# Patient Record
Sex: Male | Born: 1940 | Race: Black or African American | Hispanic: No | Marital: Single | Smoking: Former smoker
Health system: Southern US, Community
[De-identification: ages and names within clinical notes are randomized; demographics above are authoritative.]

## PROBLEM LIST (undated history)

## (undated) DIAGNOSIS — A498 Other bacterial infections of unspecified site: Secondary | ICD-10-CM

## (undated) DIAGNOSIS — I890 Lymphedema, not elsewhere classified: Secondary | ICD-10-CM

## (undated) DIAGNOSIS — K635 Polyp of colon: Secondary | ICD-10-CM

## (undated) DIAGNOSIS — E785 Hyperlipidemia, unspecified: Secondary | ICD-10-CM

## (undated) DIAGNOSIS — E039 Hypothyroidism, unspecified: Secondary | ICD-10-CM

## (undated) DIAGNOSIS — E119 Type 2 diabetes mellitus without complications: Secondary | ICD-10-CM

## (undated) DIAGNOSIS — K759 Inflammatory liver disease, unspecified: Secondary | ICD-10-CM

## (undated) DIAGNOSIS — Z6841 Body Mass Index (BMI) 40.0 and over, adult: Secondary | ICD-10-CM

## (undated) DIAGNOSIS — Z8546 Personal history of malignant neoplasm of prostate: Secondary | ICD-10-CM

## (undated) DIAGNOSIS — L0390 Cellulitis, unspecified: Secondary | ICD-10-CM

## (undated) DIAGNOSIS — I10 Essential (primary) hypertension: Secondary | ICD-10-CM

## (undated) HISTORY — DX: Hypothyroidism, unspecified: E03.9

## (undated) HISTORY — DX: Type 2 diabetes mellitus without complications: E11.9

## (undated) HISTORY — DX: Essential (primary) hypertension: I10

## (undated) HISTORY — DX: Polyp of colon: K63.5

## (undated) HISTORY — DX: Other bacterial infections of unspecified site: A49.8

## (undated) HISTORY — DX: Hyperlipidemia, unspecified: E78.5

## (undated) HISTORY — DX: Inflammatory liver disease, unspecified: K75.9

## (undated) HISTORY — DX: Personal history of malignant neoplasm of prostate: Z85.46

---
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
## 1999-03-30 HISTORY — PX: LAPAROSCOPIC GASTRIC BANDING: SHX1100

## 2000-03-29 HISTORY — PX: OTHER SURGICAL HISTORY: SHX169

## 2001-03-29 HISTORY — PX: INSERTION PROSTATE RADIATION SEED: SUR718

## 2009-04-16 ENCOUNTER — Ambulatory Visit: Payer: Self-pay | Admitting: Family Medicine

## 2009-05-29 ENCOUNTER — Ambulatory Visit: Payer: Self-pay | Admitting: Family Medicine

## 2010-03-29 HISTORY — PX: COLONOSCOPY: SHX174

## 2010-04-13 ENCOUNTER — Ambulatory Visit
Admission: RE | Admit: 2010-04-13 | Discharge: 2010-04-13 | Payer: Self-pay | Source: Home / Self Care | Attending: Family Medicine | Admitting: Family Medicine

## 2010-04-13 ENCOUNTER — Encounter: Payer: Self-pay | Admitting: Family Medicine

## 2010-04-13 DIAGNOSIS — E785 Hyperlipidemia, unspecified: Secondary | ICD-10-CM | POA: Insufficient documentation

## 2010-04-13 DIAGNOSIS — I10 Essential (primary) hypertension: Secondary | ICD-10-CM | POA: Insufficient documentation

## 2010-04-13 DIAGNOSIS — E881 Lipodystrophy, not elsewhere classified: Secondary | ICD-10-CM | POA: Insufficient documentation

## 2010-04-13 DIAGNOSIS — K029 Dental caries, unspecified: Secondary | ICD-10-CM | POA: Insufficient documentation

## 2010-04-13 DIAGNOSIS — E1165 Type 2 diabetes mellitus with hyperglycemia: Secondary | ICD-10-CM | POA: Insufficient documentation

## 2010-04-13 LAB — CONVERTED CEMR LAB
Cholesterol: 153 mg/dL
HDL: 80 mg/dL
Hgb A1c MFr Bld: 6.9 %
LDL Cholesterol: 61 mg/dL
Specific Gravity, Urine: 1.02
Urobilinogen, UA: 1
WBC Urine, dipstick: NEGATIVE

## 2010-04-14 ENCOUNTER — Encounter: Payer: Self-pay | Admitting: Family Medicine

## 2010-04-20 ENCOUNTER — Telehealth: Payer: Self-pay | Admitting: Family Medicine

## 2010-04-27 ENCOUNTER — Encounter: Payer: Self-pay | Admitting: Family Medicine

## 2010-04-27 ENCOUNTER — Telehealth: Payer: Self-pay | Admitting: Family Medicine

## 2010-04-27 ENCOUNTER — Ambulatory Visit
Admission: RE | Admit: 2010-04-27 | Discharge: 2010-04-27 | Payer: Self-pay | Source: Home / Self Care | Attending: Family Medicine | Admitting: Family Medicine

## 2010-04-29 DIAGNOSIS — K635 Polyp of colon: Secondary | ICD-10-CM

## 2010-04-29 HISTORY — DX: Polyp of colon: K63.5

## 2010-04-30 LAB — CONVERTED CEMR LAB
AST: 20 units/L (ref 0–37)
Alkaline Phosphatase: 77 units/L (ref 39–117)
BUN: 10 mg/dL (ref 6–23)
Basophils Absolute: 0 10*3/uL (ref 0.0–0.1)
Basophils Relative: 0 % (ref 0–1)
Creatinine, Ser: 0.87 mg/dL (ref 0.40–1.50)
Eosinophils Relative: 3 % (ref 0–5)
HCT: 43.8 % (ref 39.0–52.0)
Hemoglobin: 14.5 g/dL (ref 13.0–17.0)
MCHC: 33.1 g/dL (ref 30.0–36.0)
Monocytes Absolute: 0.9 10*3/uL (ref 0.1–1.0)
RDW: 14 % (ref 11.5–15.5)

## 2010-04-30 NOTE — Progress Notes (Signed)
  Phone Note Outgoing Call   Call placed by: Levonne Spiller EMT-P,  April 20, 2010 3:16 PM Call placed to: Patient Summary of Call: Pt. was given his current lab results and stated that he would be in the clinic on Jan. 30th for his follow-up. Initial call taken by: Levonne Spiller EMT-P,  April 20, 2010 3:17 PM

## 2010-04-30 NOTE — Letter (Signed)
Summary: MEDICAL RECORDS FROM [facility]  MEDICAL RECORDS FROM [facility]   Imported By: Rosine Beat 04/13/2010 16:44:45  _____________________________________________________________________  External Attachment:    Type:   Image     Comment:   External Document

## 2010-04-30 NOTE — Letter (Signed)
Summary: statement for diabetic supplies  statement for diabetic supplies   Imported By: Rosine Beat 04/14/2010 15:52:29  _____________________________________________________________________  External Attachment:    Type:   Image     Comment:   External Document

## 2010-04-30 NOTE — Assessment & Plan Note (Signed)
Summary: check up/jbb   Vital Signs:  Patient Profile:   70 Years Old Male CC:      General Medical Evaluation Height:     72 inches Weight:      450 pounds BMI:     61.25 O2 Sat:      95 % O2 treatment:    Room Air Temp:     98.1 degrees F oral Pulse rate:   98 / minute Pulse rhythm:   regular Resp:     20 per minute BP sitting:   157 / 103  (right arm)  Pt. in pain?   no  Vitals Entered By: Levonne Spiller EMT-P (April 13, 2010 11:35 AM)              Is Patient Diabetic? No CBG Result 140  Does patient need assistance? Functional Status Self care Ambulation Normal      Current Allergies: No known allergies History of Present Illness History from: patient Reason for visit: medication refills Chief Complaint: General Medical Evaluation History of Present Illness: The patient presented today because he needed to have his medications refilled.  He has not been seen by a provider since I saw him in [place] in March 2011.  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 He says that he has been without his medication and needs refills.  He reports having prostate problems but he has not been back to see his urologist as they had recommended to him.  He says that he is having some wheezing at night and SOB.  He has severe OSA but stopped using his CPAP because "he has been busy."  He denies CP but reports fatigue and increasing edema in the lower legs since stopping the use of diuretics.  He only occasionally checks his blood sugars but reports readings of 160 - 200.  He has rarely had blood sugar readings less than 100.  The patient reports that he is urinating more frequently in the past year.  He says that he has a history of prostate cancer and had his prostate seeded years ago in [place] but never followed up for treatment.   The patient reports that he established with a new diabetes supply company and they will be sending him supplies for his  lancets and testing strips.    REVIEW OF SYSTEMS Constitutional Symptoms      Denies fever, chills, night sweats, weight loss, weight gain, and fatigue.  Eyes       Denies change in vision, eye pain, eye discharge, glasses, contact lenses, and eye surgery. Ear/Nose/Throat/Mouth       Denies hearing loss/aids, change in hearing, ear pain, ear discharge, dizziness, frequent runny nose, frequent nose bleeds, sinus problems, sore throat, hoarseness, and tooth pain or bleeding.  Respiratory       Complains of wheezing and shortness of breath.      Denies dry cough, productive cough, asthma, bronchitis, and emphysema/COPD.  Cardiovascular       Complains of tires easily with exhertion.      Denies murmurs and chest pain.    Gastrointestinal       Complains of nausea/vomiting.      Denies stomach pain, diarrhea, constipation, blood in bowel movements, and indigestion. Genitourniary       Complains of loss of urinary control.      Denies painful urination, blood or discharge from penis, and kidney stones. Neurological  Denies paralysis, seizures, and fainting/blackouts. Musculoskeletal       Denies muscle pain, joint pain, joint stiffness, decreased range of motion, redness, swelling, muscle weakness, and gout.  Skin       Denies bruising, unusual mles/lumps or sores, and hair/skin or nail changes.  Psych       Denies mood changes, temper/anger issues, anxiety/stress, speech problems, depression, and sleep problems. Other Comments: Pt. is being seen for Check Up exam, and Rx refills.   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  Pt says he quit using tobacco in 1985.  Denies ETOH and Recreational Drugs. Physical Exam General appearance: morbidly obese, well developed, well nourished, no acute distress Head: normocephalic, atraumatic Eyes: conjunctivae and lids normal Pupils: equal, round, reactive to light Ears: normal, no lesions or deformities Nasal: mucosa pink, nonedematous, no septal deviation, turbinates normal Oral/Pharynx: tongue normal, posterior pharynx without erythema or exudate, dental caries seen Neck: neck supple,  trachea midline, no masses Thyroid: no nodules, masses, tenderness, or enlargement Chest/Lungs: mild anterior exp wheezes heard Heart: regular rate and  rhythm, no murmur Abdomen: morbidly obese, 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, soft, non-tender without obvious organomegaly Extremities: 3++ edema bilateral lower extremites, chronic venous stasis dermatitis Neurological: grossly intact and non-focal Skin: no obvious rashes or lesions MSE: oriented to time, place, and person Assessment Problems:   New Problems: UNSPECIFIED DENTAL CARIES (ICD-521.00) 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 The risks, benefits and possible side effects were clearly explained and discussed with the patient.  The patient verbalized clear understanding.  The patient was given instructions to return if symptoms don't improve, worsen or new changes develop.  If it is not during clinic hours and the patient cannot get back to this clinic then the patient was told to seek  medical care at an available urgent care or emergency department.  The patient verbalized understanding.   Demonstrates willingness to comply.  Plan New Medications/Changes: AZITHROMYCIN 250 MG TABS (AZITHROMYCIN) take 2 tabs by mouth on day 1, then 1 tab by mouth daily until completed.  #6 x 0, 04/13/2010, Clanford Johnson MD METFORMIN HCL 1000 MG (OSM) XR24H-TAB (METFORMIN HCL) take 1 by mouth two times a day with meals  #60 x 1, 04/13/2010, Clanford Johnson MD METOPROLOL TARTRATE 50 MG TABS (METOPROLOL TARTRATE) 1 tab twice per day  #60 x 1, 04/13/2010, Clanford Johnson MD QUINAPRIL HCL 20 MG TABS (QUINAPRIL HCL) 1 tab twice per day  #30 x 1, 04/13/2010, Clanford Johnson MD SIMVASTATIN 20 MG TABS (SIMVASTATIN) 1 tab per day  #30 x 1, 04/13/2010, Clanford Johnson MD LEVOTHYROXINE SODIUM 200 MCG SOLR (LEVOTHYROXINE SODIUM) 1 tab per day  #30 x 0, 04/13/2010, Clanford Johnson MD LEVEMIR FLEXPEN 100 UNIT/ML SOLN (INSULIN DETEMIR) 1 dose per day  #1 box x 0, 04/13/2010, Clanford Johnson MD HUMULIN R 100 UNIT/ML SOLN (  INSULIN REGULAR HUMAN) 1 dose per day  #1 vial x 0, 04/13/2010, Clanford Johnson MD FUROSEMIDE 40 MG TABS (FUROSEMIDE) 1 tab per day  #30 x 0, 04/13/2010, Clanford Johnson MD DETROL LA 4 MG XR24H-CAP (TOLTERODINE TARTRATE) 1 tab per day  #30 x 0, 04/13/2010, Clanford Johnson MD  Planning Comments:   I counseled patient at length about the importance of regular medical care and close follow up. The patient verbalized clear understanding.  I spent more than 70 mins with patient today.  Labs are pending.  Pt was instructed to test blood glucose 3 times per day and as needed.  The patient verbalized clear understanding.  Will refill the patient's medications.  F/U in 1 month.  Elevate legs and take diuretic medications.  Lipids are doing well on medications.   Follow Up: 1 month   The patient and/or caregiver has been counseled thoroughly with regard to medications prescribed including  dosage, schedule, interactions, rationale for use, and possible side effects and they verbalize understanding.  Diagnoses and expected course of recovery discussed and will return if not improved as expected or if the condition worsens. Patient and/or caregiver verbalized understanding.  Prescriptions: AZITHROMYCIN 250 MG TABS (AZITHROMYCIN) take 2 tabs by mouth on day 1, then 1 tab by mouth daily until completed.  #6 x 0   Entered and Authorized by:   Standley Dakins MD   Signed by:   Standley Dakins MD on 04/13/2010   Method used:   Electron[facility] to        [facility] [id number] (retail)       [address]       [place], [place]  [address]       Ph: [phone] or [phone]       Fax: [phone]   RxID:   [id number] METFORMIN HCL 1000 MG (OSM) XR24H-TAB (METFORMIN HCL) take 1 by mouth two times a day with meals  #60 x 1   Entered and Authorized by:   Standley Dakins MD   Signed by:   Standley Dakins MD on 04/13/2010   Method used:   [facility]        [facility] [id number] (retail[facility]       [address]       [place], [place]  [address]       Ph: [phone] or [phone]       Fax: [phone]   RxID:   [id number] METOPROLOL TARTRATE 50 MG TABS (METOPROLOL TARTRATE) 1 tab twice per day  #60 x 1   Entered and Authorized by:   Standley Dakins MD   Signed by:   Standley Dakins MD on 04/13/2010   Method used:   [facility]        [facility] [id number] ([facility]ail[facility]       [address]       [place], [place]  [address]       Ph: [phone] or [phone]       Fax: [phone]   RxID:   [id number] QUINAPRIL HCL 20 MG TABS (QUINAPRIL HCL) 1 tab twice per day  #30 x 1   Entered and Authorized by:   Standley Dakins MD   Signed by:   Standley Dakins MD on 04/13/2010   Method used:   [facility]        [facility] [phone][facility] (retail)       [address]       [place], [place]  [address]       Ph: [phone]  or [phone]       Fax: [phone]   RxID:   [id number] SIMVASTATIN 20 MG TABS (SIMVASTATIN) 1 tab per day  #30 x 1   Entered and Authorized by:   Standley Dakins MD   Signed by:   Standley Dakins MD on 04/13/2010   Method used:   [facility]        [facility] [id number]       [address]       [place], [place]  [address]       Ph: [phone] or [phone]       Fax: [phone]   RxID:   [id number] LEVOTHYROXINE SODIUM 200 MCG SOLR (LEVOTHYROXINE SODIUM) 1 tab per day  #30 x 0   Entered and Authorized by:   Standley Dakins MD   Signed by:   Standley Dakins MD on 04/13/2010   Method used:   [facility]        [facility] [id number]       [address]       [place], [place]  [address]       Ph: [phone] or [phone]       Fax: [phone]   RxID:   [id number] LEVEMIR FLEXPEN 100 UNIT/ML SOLN (INSULIN DETEMIR) 1 dose per day  #1 box x 0   Entered and Authorized by:   Standley Dakins MD   Signed by:   Standley Dakins MD on 04/13/2010   Method used:   [facility]        [facility] [id number]       [address]       [place], [place]  [address]       Ph: [phone] or [phone]       Fax: [phone]   RxID:   [id number] HUMULIN R 100 UNIT/ML SOLN (INSULIN REGULAR HUMAN) 1 dose per day  #1 vial x 0   Entered and Authorized by:   Standley Dakins MD   Signed by:   Standley Dakins MD on 04/13/2010   Method used:   [facility]        [facility] (retail)       [address]       [place], [place]  [address]       Ph: [phone] or [phone]       Fax: [phone]   RxID:   [id number] FUROSEMIDE 40 MG TABS (FUROSEMIDE) 1 tab per day  #30 x 0   Entered and Authorized by:   Standley Dakins MD   Signed by:   Standley Dakins MD on 04/13/2010   Method used:   [facility]        [facility] [id number]        [address]       [place], [place]  [address]       Ph: [phone] or [phone]       Fax: [phone]   RxID:   [id number] DETROL LA 4 MG XR24H-CAP (TOLTERODINE TARTRATE) 1 tab per day  #30 x 0   Entered and Authorized by:   Standley Dakins MD   Signed by:   Standley Dakins MD on 04/13/2010   Method used:   [facility]        [facility] [phone][facility] (retail)       [address]  [place], [place]  [address]       Ph: [phone] or [phone]       Fax: [phone]   RxID:   [id number]   Patient Instructions: 1)  Check your blood sugars regularly. If your readings are usually above 200 : or below 70 you should contact our office. 2)  It is important that your Diabetic A1c level is checked every 3 months. 3)  See your eye doctor yearly to check for diabetic eye damage. 4)  Check your feet each night for sore areas, calluses or signs of infection. 5)  Check your Blood Pressure regularly. If it is above:140/90  you should make an appointment. 6)  Please schedule a follow-up appointment in 1 month. 7)  You received a flu vaccine today.  8)  The patient was informed that there is no on-call provider or services available at this clinic during off-hours (when the clinic is closed).  If the patient developed a problem or concern that required immediate attention, the patient was advised to go the the nearest available urgent care or emergency department for medical care.  The patient verbalized understanding.    9)  The patient's prescriptions were checked for possible interactions and electronically sent to the pharmacy of choice.    Medication Administration  Medication # 1:    Medication: Albuterol Sulfate Sol 3mg  unit dose    Diagnosis: WHEEZING    Dose: 1    Route: inhaled    Exp Date: 03/29/2011    Lot #: [id number]    Mfr: NEPHRON    Patient tolerated medication without complications    Given by: Levonne Spiller EMT-P (April 13, 2010 1:05  PM)   Immunizations Administered:  Influenza Vaccine:    Vaccine Type: FLUZONE    Site: right deltoid    Mfr: Sanofi Pasteur    Dose: 0.5 ml    Route: IM    Given by: Levonne Spiller EMT-P    Exp. Date: 09/26/2010    Lot #: [id number]    VIS given: 10/21/09 version given April 13, 2010.  Lab Results Urinalysis:      Color:     Yellow    Appear:     Clear    Leuk:     Neg    Nitr:     Neg    Urobil:     1.0    Prot:     Trace    pH:     8.0    Blood:     Neg    Sp. Gr:     1.020    Ket:     Trace    Bili:     Neg    Glu:     Neg    U Micralb:   30 mg/L  Lipids      Chol:     153   mg/dL    Trig:     60   mg/dL    HDL:     80   mg/dL    LDL:     61   mg/dL     Hgb Q2V:     6.9   %   Immunizations Administered:  Influenza Vaccine:    Vaccine Type: FLUZONE    Site: right deltoid    Mfr: Sanofi Pasteur    Dose: 0.5 ml    Route: IM    Given by: Levonne Spiller EMT-P    Exp. Date: 09/26/2010    Lot #:  [id number]    VIS given: 10/21/09 version given April 13, 2010.  Flu Vaccine Consent Questions:    Do you have a history of severe allergic reactions to this vaccine? no    Any prior history of allergic reactions to egg and/or gelatin? no    Do you have a sensitivity to the preservative Thimersol? no    Do you have a past history of Guillan-Barre Syndrome? no    Do you currently have an acute febrile illness? no    Have you ever had a severe reaction to latex? no    Vaccine information given and explained to patient? yes  I spent over 70 mins with patient today reviewing his complicated medical history. Pt is a very poor historian and not compliant with medical recommendations and doctor's orders, etc.  The patient was given instructions to please follow instructions.  I reviewed the brief notes sent from [facility] in [place], [place].  I also requested that the patient see his urologist immediately to follow up on his prostate. The patient says that he will  make an appointment as soon as he gets home.  Will follow up on the labs when available.  I told the patient to stop blindly injecting insulin into the area of lipohypertrophy on his right abdomen. The patient verbalized clear understanding.  I asked the patient to rotate sites of insulin injection and to please monitor BS closely 3x/d and as needed. The patient verbalized clear understanding.   Rodney Langton, MD, CDE, [employer]

## 2010-05-01 ENCOUNTER — Telehealth: Payer: Self-pay | Admitting: Family Medicine

## 2010-05-06 NOTE — Assessment & Plan Note (Signed)
Summary: F/U FROM LAST VISIT/EVM   Vital Signs:  Patient profile:   70 year old male O2 Sat:      97 % on Room air Temp:     98.7 degrees F oral Pulse rate:   82 / minute Pulse rhythm:   regular Resp:     18 per minute BP sitting:   130 / 90  (left arm)  Vitals Entered By: Standley Dakins MD (April 27, 2010 1:32 PM)  O2 Flow:  Room air  History of Present Illness: The patient presented today for a followup appointment. The patient reports that he has been taking his medications more regularly. He does admit that he has not been taking the furo does not take it on some occasions because of fear that he will have a urinary accident. The patient reports that when he does take the Lasix he has noticed an improvement and the chronic edema in the lower extremities. He has noticed more improvement in the left lower extremity. The patient reports that his blood sugars have been between 177 and 200. He reports that his blood sugar was 177 this morning. He reports that he consumed 2 yogurts desserts last night. The patient reports that he is not having a lot of cramping in the extremities. The patient reports that he has experienced cramping from taking Lasix in the past. He reports that he's had no hypoglycemia meaning no blood sugars less than 100. The patient reports that he is scheduled to followup with [facility] and also with his gastroenterology to followup with them for the scheduled evaluations. The patient reports that he is planning to travel to [place] later this week to handle business affairs. The patient reports that he otherwise has been feeling well. He's not having chest pain or shortness of breath. He reports that his exercise tolerance has been stable. the patient denies having headaches. He does report that he is still consuming large amounts of fast foods. He reports that he likes fried fish and reports that he has been consuming chicken wings from a fast food restaurant. The  patient reports that he goes to [employer] chicken as well. We spent some time today discussing dietary choices and making wiser choices in terms of what's available at some of these places and I strongly encouraged the patient to try and avoid a large sodium content diet.  The patient verbalized understanding.  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  Pt says he quit using tobacco in 1985.  Denies ETOH and Recreational Drugs.  Family History: Reviewed history from 04/13/2010 and no changes required. DM HTN  Social History: Reviewed history from 04/13/2010 and no changes required. 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  Pt says he quit using tobacco in 1985.  Denies ETOH and Recreational Drugs.  Review of Systems General:  Complains of fatigue and sleep disorder; denies chills, fever, loss of appetite, malaise, sweats, weakness, and weight loss;  The patient has obstructive sleep apnea but is no longer using his CPAP.Marland Kitchen Eyes:  Denies blurring, discharge, double vision, eye irritation, eye pain, halos, itching, light sensitivity, red eye, vision loss-1 eye, and vision loss-both eyes. ENT:  Denies decreased hearing, difficulty swallowing, ear discharge, earache, hoarseness, nasal congestion, nosebleeds, postnasal drainage, ringing in ears, sinus pressure, and sore throat. CV:  Complains of difficulty  breathing while lying down and fatigue; denies bluish discoloration of lips or nails, chest pain or discomfort, difficulty breathing at night, fainting, leg cramps with exertion, lightheadness, near fainting, palpitations, shortness of breath with exertion, swelling of feet, swelling of hands, and weight gain. Resp:  Denies chest discomfort, chest pain with inspiration, cough, coughing up blood, excessive snoring, hypersomnolence, morning headaches, pleuritic, shortness of breath, sputum productive, and wheezing. GI:  Complains of indigestion; denies abdominal pain, bloody stools, change in bowel habits, constipation, dark tarry stools, diarrhea, excessive appetite, gas, hemorrhoids, loss of appetite, nausea, vomiting, vomiting blood, and yellowish skin color. GU:  Complains of dysuria, incontinence, and urinary frequency; denies decreased libido, discharge, erectile dysfunction, genital sores, hematuria, nocturia, and urinary hesitancy. MS:  Denies joint pain, joint redness, joint swelling, loss of strength, low back pain, mid back pain, muscle aches, muscle , cramps, muscle weakness, stiffness, and thoracic pain. Neuro:  Denies brief paralysis, difficulty with concentration, disturbances in coordination, falling down, headaches, inability to speak, memory loss, numbness, poor balance, seizures, sensation of room spinning, tingling, tremors, visual disturbances, and weakness. Psych:  Denies alternate hallucination ( auditory/visual), anxiety, depression, easily angered, easily tearful, irritability, mental problems, panic attacks, sense of great danger, suicidal thoughts/plans, thoughts of violence, unusual visions or sounds, and thoughts /plans of harming others. Endo:  Complains of polyuria; denies cold intolerance, excessive hunger, excessive thirst, excessive urination, heat intolerance, and weight change.  Physical Exam  General:  morbidly obese, well developed, well nourished, no acute  distress Head:  Normocephalic and atraumatic without obvious abnormalities. No apparent alopecia or balding. Eyes:  No corneal or conjunctival inflammation noted. EOMI. Perrla. Funduscopic exam benign, without hemorrhages, exudates or papilledema. Vision grossly normal. Ears:  External ear exam shows no significant lesions or deformities.  Otoscopic examination reveals clear canals, tympanic membranes are intact bilaterally without bulging, retraction, inflammation or discharge. Hearing is grossly normal bilaterally. Nose:  External nasal examination shows no deformity or inflammation. Nasal mucosa are pink and moist without lesions or exudates. Mouth:  Oral mucosa and oropharynx without lesions or exudates.  poor dentition, teeth missing, and gingival inflammation and caries present. Neck:  neck supple,  trachea midline, no masses Lungs:  Normal respiratory effort, chest expands symmetrically. Lungs are clear to auscultation, no crackles or wheezes. Heart:  Normal rate and regular rhythm. S1 and S2 normal without gallop, murmur, click, rub or other extra sounds. Abdomen:  Obese, soft nondistended no masses no guarding or rigidity and no hepatosplenomegaly noted. Bowel sounds present. Msk:  mild improvement in the 3++ stasis edema in the left lower extremity and very minimal improvement in the right lower extremity edema. Extremities:  As above, mild improvement in the 3++ edema bilateral lower extremites, chronic venous stasis dermatitis over skin of both lower extremities Neurologic:  grossly intact and non-focal Skin:  stasis dermatitis bilateral lower extremities Psych:  Cognition and judgment appear intact. Alert and cooperative with normal attention span and concentration. No apparent delusions, illusions, hallucinations   Impression & Recommendations:  Problem # 1:  UNSPECIFIED ESSENTIAL HYPERTENSION (ICD-401.9)  His updated medication list for  this problem includes:    Furosemide 40 Mg  Tabs (Furosemide) .Marland Kitchen... 1 tab per day    Metoprolol Tartrate 50 Mg Tabs (Metoprolol tartrate) .Marland Kitchen... 1 tab twice per day    Quinapril Hcl 20 Mg Tabs (Quinapril hcl) .Marland Kitchen... 1 tab twice per day  Problem # 2:  LEG EDEMA, BILATERAL (ICD-782.3)  His updated medication list for this problem includes:    Furosemide 40 Mg Tabs (Furosemide) .Marland Kitchen... 1 tab per day  The patient is not taking it every day.  He was advised to take it daily for the next 2 weeks to get the edema under better control and then try every other day useage.  In addition, an RX for KCL tablets given to patient to avoid excessive potassium losses from lasix.    Problem # 3:  DIABETES MELLITUS, TYPE II, CONTROLLED (ICD-250.00)  His updated medication list for this problem includes:    Humulin R 100 Unit/ml Soln (Insulin regular human) .Marland Kitchen... 1 dose per day    Levemir Flexpen 100 Unit/ml Soln (Insulin detemir) .Marland Kitchen... 1 dose per day    Metformin Hcl 1000 Mg (osm) Xr24h-tab (Metformin hcl) .Marland Kitchen... Take 1 by mouth two times a day with meals    Quinapril Hcl 20 Mg Tabs (Quinapril hcl) .Marland Kitchen... 1 tab twice per day  I reviewed that meter with the patient today and he is having some blood glucose readings >200, mostly thought to be related to extremely poor diet and food choices.  The patient was counseled by me on better food choices and portion sizes.  The patient verbalized clear understanding.  Rodney Langton, MD, CDE, FAAFP   Problem # 4:  DYSLIPIDEMIA (ICD-272.4)  His updated medication list for this problem includes:    Simvastatin 20 Mg Tabs (Simvastatin) .Marland Kitchen... 1 tab per day  The patient was asked to follow up at the beginning of April 2012 for repeat blood work, Catering manager. to determine if this is helping. The patient verbalized clear understanding.    Problem # 5:  PERS HX NONCOMPLIANCE W/MED TX PRS HAZARDS HLTH (ICD-V15.81) The patient seems to be trying harder to do better and care for himself better.  He was encouraged today and will continue  to require ongoing close follow up.  Rodney Langton, MD, CDE, FAAFP   Complete Medication List: 1)  Detrol La 4 Mg Xr24h-cap (Tolterodine tartrate) .Marland Kitchen.. 1 tab per day 2)  Furosemide 40 Mg Tabs (Furosemide) .Marland Kitchen.. 1 tab per day 3)  Humulin R 100 Unit/ml Soln (Insulin regular human) .Marland Kitchen.. 1 dose per day 4)  Levemir Flexpen 100 Unit/ml Soln (Insulin detemir) .Marland Kitchen.. 1 dose per day 5)  Levothyroxine Sodium 200 Mcg Solr (Levothyroxine sodium) .Marland Kitchen.. 1 tab per day 6)  Metformin Hcl 1000 Mg (osm) Xr24h-tab (Metformin hcl) .... Take 1 by mouth two times a day with meals 7)  Metoprolol Tartrate 50 Mg Tabs (Metoprolol tartrate) .Marland Kitchen.. 1 tab twice per day 8)  Quinapril Hcl 20 Mg Tabs (Quinapril hcl) .Marland Kitchen.. 1 tab twice per day 9)  Simvastatin 20 Mg Tabs (Simvastatin) .Marland Kitchen.. 1 tab per day 10)  K-tabs 10 Meq Cr-tabs (Potassium chloride) .... Take 1 by mouth daily when taking furosemide (lasix) as directed  Patient Instructions: 1)  Go to the pharmacy and pick up your prescription (s).  It may take up to 30 mins for electronic prescriptions to be delivered to the pharmacy.  Please call if your pharmacy has not received your prescriptions after 30 minutes.  2)  Limit your Sodium (Salt). 3)  Limit your Sodium (Salt) to less than 2 grams a day(slightly less than 1/2 a teaspoon) to prevent fluid retention, swelling, or worsening of symptoms. 4)  It is important that you exercise regularly at least 20 minutes 5 times a week. If you develop chest pain, have severe difficulty breathing, or feel very tired , stop exercising immediately and seek medical attention. 5)  You need to lose weight. Consider a lower calorie diet and regular exercise.  6)  Check your blood sugars regularly. If your readings are usually above : 250 or below 70 you should contact our office. 7)  See your eye doctor yearly to check for diabetic eye damage. 8)  Check your Blood Pressure regularly. If it is above: 140/90 you should make an appointment. 9)   The patient was informed that there is no on-call provider or services available at this clinic during off-hours (when the clinic is closed).  If the patient developed a problem or concern that required immediate attention, the patient was advised to go the the nearest available urgent care or emergency department for medical care.  The patient verbalized understanding.    10)  Check your feet each night for sore areas, calluses or signs of infection. Prescriptions: K-TABS 10 MEQ CR-TABS (POTASSIUM CHLORIDE) take 1 by mouth daily when taking furosemide (lasix) as directed  #30 x 1   Entered and Authorized by:   Standley Dakins MD   Signed by:   Standley Dakins MD on 04/27/2010   Method used:   Electronically to        [facility]  [address] (retail)       [address]       [place], [place]  [address]       Ph: [phone] or [phone]       Fax: [phone]   RxID:   [id number]

## 2010-05-06 NOTE — Progress Notes (Signed)
  Phone Note Call from Patient   Caller: Patient Reason for Call: Talk to Doctor Summary of Call: The patient was seen in the clinic today.  He would like for the doctor to call him so he can verify some information with him.  You can call the patient at [phone]. Initial call taken by: Dorna Leitz,  April 27, 2010 4:43 PM  Follow-up for Phone Call        Pt was called back and he says that he needs a note saying that he was seen here today because he was due to be in court today in the state of [place] and ended up missing the appointment because he was still here in [place].  Pt says that he needs to have the note faxed to his attorney in [place].  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  I explained to the patient that the only thing that I could do would be to give him a letter stating that the patient was seen here in the clinic today.  I told him that I would not be able to report any more than that. The patient verbalized clear understanding.  Pt says that it should be faxed to his attorneys office:  [place] [phone].   Will fax today at patient's request.  Follow-up by: Standley Dakins MD,  April 27, 2010 5:39 PM

## 2010-05-06 NOTE — Progress Notes (Signed)
Summary: Pt called to thank  ---- Converted from flag ---- ---- 04/28/2010 5:03 PM, Rosine Beat wrote: Mr. Derek Bass called and said thank you so very much for sending the paperwork in for him, it helped him at the hearing. ------------------------------

## 2010-05-06 NOTE — Letter (Signed)
Summary: Work [employer]  [address]   [place], [place] [address]   Phone: [phone]  Fax: [phone]    Today's Date: April 27, 2010  Name of Patient: Derek Bass  (DOB 08-10-1940)  The above named patient had a medical visit today at:  12:45 PM  to evaluate complicated medical conditions.   Please take this into consideration when reviewing the time away from work/school and other activities and events.      Special Instructions:   [  ] Other ________________________________________________________________ ________________________________________________________________________   Sincerely yours,   Standley Dakins MD

## 2010-05-13 ENCOUNTER — Encounter: Payer: Self-pay | Admitting: Family Medicine

## 2010-05-18 ENCOUNTER — Encounter: Payer: Self-pay | Admitting: Family Medicine

## 2010-05-20 NOTE — Progress Notes (Signed)
Summary: GI records  GI records   Imported By: Dorna Leitz 05/14/2010 16:41:49  _____________________________________________________________________  External Attachment:    Type:   Image     Comment:   External Document

## 2010-05-22 ENCOUNTER — Other Ambulatory Visit: Payer: Self-pay | Admitting: Gastroenterology

## 2010-05-22 ENCOUNTER — Ambulatory Visit (HOSPITAL_COMMUNITY)
Admission: RE | Admit: 2010-05-22 | Discharge: 2010-05-22 | Disposition: A | Discharge status: Home / Self Care | Payer: Medicare PPO | Source: Ambulatory Visit | Attending: Gastroenterology | Admitting: Gastroenterology

## 2010-05-22 DIAGNOSIS — K5730 Diverticulosis of large intestine without perforation or abscess without bleeding: Secondary | ICD-10-CM | POA: Insufficient documentation

## 2010-05-22 DIAGNOSIS — K644 Residual hemorrhoidal skin tags: Secondary | ICD-10-CM | POA: Insufficient documentation

## 2010-05-22 DIAGNOSIS — Z1211 Encounter for screening for malignant neoplasm of colon: Secondary | ICD-10-CM | POA: Insufficient documentation

## 2010-05-22 DIAGNOSIS — D126 Benign neoplasm of colon, unspecified: Secondary | ICD-10-CM | POA: Insufficient documentation

## 2010-05-22 DIAGNOSIS — K648 Other hemorrhoids: Secondary | ICD-10-CM | POA: Insufficient documentation

## 2010-05-26 NOTE — Medication Information (Signed)
Summary: Tax adviser   Imported By: Dorna Leitz 05/20/2010 19:28:54  _____________________________________________________________________  External Attachment:    Type:   Image     Comment:   External Document

## 2010-06-01 ENCOUNTER — Telehealth: Payer: Self-pay | Admitting: Family Medicine

## 2010-06-01 ENCOUNTER — Encounter: Payer: Self-pay | Admitting: Family Medicine

## 2010-06-09 NOTE — Progress Notes (Signed)
  Phone Note Call from Patient   Summary of Call: Patient called in stated that he had a little to much sweets yesterday for his birthday.. this morning his blood sugar was 280 @ 8:55am he gave himself the daily injection levenir fix pen and 9:00 keytone test it came up low... patient wanted to what should he do to make his blood sugar come down..have not ate anything today.. please cantact patient @ [phone] Initial call taken by: Eugenio Hoes,  June 01, 2010 12:41 PM  Follow-up for Phone Call        I called patient and he said that he took 30 units extra of his humulin insulin.  He said that he had been eating cake and parfait treats and his blood glucose started climbing. Now it has improved and come down.   I told him to continue to monitor his blood sugar closely and to take 10 units of Humulin R when he eats next meal.  Call us in 3 days with new blood sugar readings.  The patient verbalized clear understanding.   HERE IS A SUMMARY OF HOME BLOOD GLUCOSE READINGS: 855am 280 1055    [id number]  165   Follow-up by: Standley Dakins MD,  June 01, 2010 3:52 PM

## 2010-06-25 NOTE — Medication Information (Signed)
Summary: prescription  prescription   Imported By: Eugenio Hoes 06/19/2010 12:28:40  _____________________________________________________________________  External Attachment:    Type:   Image     Comment:   External Document

## 2010-08-31 ENCOUNTER — Ambulatory Visit: Payer: Medicare PPO | Admitting: Family Medicine

## 2010-09-14 ENCOUNTER — Ambulatory Visit: Payer: Medicare PPO | Admitting: Family Medicine

## 2010-09-15 ENCOUNTER — Encounter (INDEPENDENT_AMBULATORY_CARE_PROVIDER_SITE_OTHER): Payer: Medicare PPO | Admitting: Family Medicine

## 2010-09-15 ENCOUNTER — Other Ambulatory Visit: Payer: Self-pay | Admitting: Family Medicine

## 2010-09-17 ENCOUNTER — Ambulatory Visit (INDEPENDENT_AMBULATORY_CARE_PROVIDER_SITE_OTHER): Payer: Medicare HMO | Admitting: Family Medicine

## 2010-09-17 ENCOUNTER — Encounter: Payer: Self-pay | Admitting: Family Medicine

## 2010-09-17 VITALS — BP 158/98 | HR 80 | Temp 98.3°F | Ht 74.0 in | Wt >= 6400 oz

## 2010-09-17 DIAGNOSIS — K029 Dental caries, unspecified: Secondary | ICD-10-CM

## 2010-09-17 DIAGNOSIS — R609 Edema, unspecified: Secondary | ICD-10-CM

## 2010-09-17 DIAGNOSIS — E039 Hypothyroidism, unspecified: Secondary | ICD-10-CM | POA: Insufficient documentation

## 2010-09-17 DIAGNOSIS — E785 Hyperlipidemia, unspecified: Secondary | ICD-10-CM

## 2010-09-17 DIAGNOSIS — E119 Type 2 diabetes mellitus without complications: Secondary | ICD-10-CM

## 2010-09-17 DIAGNOSIS — I10 Essential (primary) hypertension: Secondary | ICD-10-CM

## 2010-09-17 LAB — MICROALBUMIN / CREATININE URINE RATIO
Creatinine,U: 367.8 mg/dL
Microalb Creat Ratio: 3.7 mg/g (ref 0.0–30.0)
Microalb, Ur: 13.7 mg/dL — ABNORMAL HIGH (ref 0.0–1.9)

## 2010-09-17 LAB — COMPREHENSIVE METABOLIC PANEL
AST: 18 U/L (ref 0–37)
Albumin: 4.2 g/dL (ref 3.5–5.2)
Alkaline Phosphatase: 68 U/L (ref 39–117)
BUN: 10 mg/dL (ref 6–23)
Potassium: 4.1 mEq/L (ref 3.5–5.1)
Sodium: 141 mEq/L (ref 135–145)
Total Bilirubin: 0.7 mg/dL (ref 0.3–1.2)

## 2010-09-17 LAB — LIPID PANEL
Cholesterol: 159 mg/dL (ref 0–200)
LDL Cholesterol: 83 mg/dL (ref 0–99)
VLDL: 18.2 mg/dL (ref 0.0–40.0)

## 2010-09-17 LAB — HEMOGLOBIN A1C: Hgb A1c MFr Bld: 6.6 % — ABNORMAL HIGH (ref 4.6–6.5)

## 2010-09-17 MED ORDER — TAMSULOSIN HCL 0.4 MG PO CAPS: 0.4000 mg | ORAL_CAPSULE | Freq: Every day | ORAL | Status: DC

## 2010-09-17 MED ORDER — SIMVASTATIN 20 MG PO TABS: 20.0000 mg | ORAL_TABLET | Freq: Every day | ORAL | Status: DC

## 2010-09-17 MED ORDER — METFORMIN HCL 1000 MG PO TABS: 1000.0000 mg | ORAL_TABLET | Freq: Two times a day (BID) | ORAL | Status: DC

## 2010-09-17 MED ORDER — METOPROLOL TARTRATE 50 MG PO TABS: 50.0000 mg | ORAL_TABLET | Freq: Two times a day (BID) | ORAL | Status: DC

## 2010-09-17 MED ORDER — FUROSEMIDE 40 MG PO TABS: 40.0000 mg | ORAL_TABLET | Freq: Every day | ORAL | Status: DC

## 2010-09-17 MED ORDER — TOLTERODINE TARTRATE ER 4 MG PO CP24: 4.0000 mg | ORAL_CAPSULE | Freq: Every day | ORAL | Status: DC

## 2010-09-17 MED ORDER — QUINAPRIL HCL 20 MG PO TABS: 20.0000 mg | ORAL_TABLET | Freq: Two times a day (BID) | ORAL | Status: DC

## 2010-09-17 MED ORDER — POTASSIUM CHLORIDE 10 MEQ PO TBCR: 10.0000 meq | EXTENDED_RELEASE_TABLET | Freq: Every day | ORAL | Status: DC

## 2010-09-17 MED ORDER — INSULIN DETEMIR 100 UNIT/ML ~~LOC~~ SOLN: 45.0000 [IU] | Freq: Every day | SUBCUTANEOUS | Status: DC

## 2010-09-17 MED ORDER — INSULIN REGULAR HUMAN 100 UNIT/ML IJ SOLN: INTRAMUSCULAR | Status: DC

## 2010-09-17 MED ORDER — LEVOTHYROXINE SODIUM 200 MCG PO TABS: 200.0000 ug | ORAL_TABLET | Freq: Every day | ORAL | Status: DC

## 2010-09-17 NOTE — Assessment & Plan Note (Signed)
On insulin, short and long acting, as well as metformin. Check blood work today, reassess. Advised schedule vision screen. Foot exam today.

## 2010-09-17 NOTE — Patient Instructions (Addendum)
Please sign release of information for records from Dr. Elnoria Howard for colonoscopy and Dr. Myrtie Soman for prostate. Blood work today.  Schedule appointment for vision check. meds will be refilled today.  3 month supply. Schedule complete physical exam at your convenience. Good to meet you today.  Call us with questions.

## 2010-09-17 NOTE — Assessment & Plan Note (Signed)
Advised patient to set up dental exam.  Needs several extractions. Discussed association between periodontal disease and CV disease.

## 2010-09-17 NOTE — Assessment & Plan Note (Signed)
Good control per last check, recheck today as due. No myalgias.

## 2010-09-17 NOTE — Progress Notes (Signed)
Subjective:    Patient ID: Derek Bass, male    DOB: Aug 18, 1940, 70 y.o.   MRN: [id number]  HPI CC: new patient, establish.  Complicated 70 yo with h/o obesity, DM, HTN, HLD presents to establish care.  Spends most time in [place], some time in [place].  Family local.  Previously saw Dr. Laural Benes of [facility], prior saw Dr. Velna Hatchet.  1. DM - sugars have been running 110s fasting. Tolerating meds well.  Takes 45u levemir and 10u regular insulin after checking sugars.  Last vision screen was >1 year ago.  Tries to drink more water.  No recent foot exam.  2. HTN - compliant with meds.  bp up today, new doc.  No HA, vision changes, CP/tightness, SOB, leg swelling.  3. HLD - on simva, prior on gemfibrozil but stopped 2/2 interaction.  H/o hypothyroidism - on levothyroxine. H/o dental caries, last exam early 2011. H/o polyps in colon, last colonoscopy was 2012 in [place], Dr. Elnoria Howard.  Told looking ok rec rpt 3 or 32yrs put unsure. States prostate checked last year, looked ok.  H/o prostate CA. Lab Results  Component Value Date   HGBA1C 6.9 04/13/2010   Lab Results  Component Value Date   CHOL 153 04/13/2010   Lab Results  Component Value Date   HDL 80 04/13/2010   Lab Results  Component Value Date   LDLCALC 61 04/13/2010   Had lemonade this am. Medications and allergies reviewed and updated in chart.  Patient Active Problem List  Diagnoses  . DIABETES MELLITUS, TYPE II, CONTROLLED  . DYSLIPIDEMIA  . LIPODYSTROPHY  . UNSPECIFIED ESSENTIAL HYPERTENSION  . UNSPECIFIED DENTAL CARIES  . LEG EDEMA, BILATERAL  . PERS HX NONCOMPLIANCE W/MED TX PRS HAZARDS HLTH  . Hypothyroid   Past Medical History  Diagnosis Date  . T2DM (type 2 diabetes mellitus)   . HTN (hypertension)   . HLD (hyperlipidemia)   . Colon polyps     colonoscopy 2011  . Hypothyroid   . Hepatitis 1961    when in service, thinks viral  . Prostate cancer    Past Surgical History  Procedure Date  .  Laparoscopic gastric banding 2001    [place], [facility]  . Goiter removed 2002  . Insertion prostate radiation seed 2003   History  Substance Use Topics  . Smoking status: Former Smoker    Quit date: 01/27/1985  . Smokeless tobacco: Never Used  . Alcohol Use: No   Family History  Problem Relation Age of Onset  . Coronary artery disease Mother 73  . Prostate cancer Brother   . Diabetes Brother   . Stroke Neg Hx    No Known Allergies No current outpatient prescriptions on file prior to visit.   Review of Systems  Constitutional: Negative for fever, chills, activity change, appetite change, fatigue and unexpected weight change.  HENT: Negative for hearing loss and neck pain.   Eyes: Negative for visual disturbance.  Respiratory: Positive for shortness of breath. Negative for cough, chest tightness and wheezing.   Cardiovascular: Positive for leg swelling. Negative for chest pain and palpitations.  Gastrointestinal: Positive for nausea and diarrhea. Negative for vomiting, abdominal pain, constipation, blood in stool and abdominal distention.  Genitourinary: Negative for hematuria and difficulty urinating.  Musculoskeletal: Negative for myalgias and arthralgias.  Skin: Negative for rash.  Neurological: Negative for dizziness, seizures, syncope and headaches.  Hematological: Does not bruise/bleed easily.  Psychiatric/Behavioral: Negative for dysphoric mood. The patient is not nervous/anxious.  Objective:   Physical Exam  Nursing note and vitals reviewed. Constitutional: He is oriented to person, place, and time. He appears well-developed and well-nourished. No distress.       Morbidly obese  HENT:  Head: Normocephalic and atraumatic.  Right Ear: Hearing, tympanic membrane, external ear and ear canal normal.  Left Ear: Hearing, tympanic membrane, external ear and ear canal normal.  Nose: Nose normal.  Mouth/Throat: Oropharynx is clear and moist. Abnormal dentition. Dental  caries present. No oropharyngeal exudate, posterior oropharyngeal edema or tonsillar abscesses.       Poor dentition, chipped teeth, missing teeth  Eyes: Conjunctivae and EOM are normal. Pupils are equal, round, and reactive to light. No scleral icterus.  Neck: Normal range of motion. Neck supple. No thyromegaly present.  Cardiovascular: Normal rate, regular rhythm, normal heart sounds and intact distal pulses.   No murmur heard. Pulses:      Radial pulses are 2+ on the right side, and 2+ on the left side.       Dorsalis pedis pulses are 2+ on the right side, and 2+ on the left side.       Posterior tibial pulses are 2+ on the right side, and 2+ on the left side.  Pulmonary/Chest: Effort normal and breath sounds normal. No respiratory distress. He has no wheezes. He has no rales.  Abdominal: Soft. Bowel sounds are normal. He exhibits no distension. There is no tenderness. There is no rebound and no guarding.  Musculoskeletal: Normal range of motion.  Lymphadenopathy:    He has no cervical adenopathy.  Neurological: He is alert and oriented to person, place, and time.       CN grossly intact, station and gait intact  Skin: Skin is warm and dry. No rash noted.       BLE with thickened skin, edematous, elephantiasis  Psychiatric: He has a normal mood and affect. His behavior is normal.       Diabetic foot exam: Thickened skin. No skin breakdown + calluses  Normal DP/PT pulses Normal sensation to light tough and decreased to monofilament Nails normal Assessment & Plan:

## 2010-09-17 NOTE — Assessment & Plan Note (Signed)
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   Continue lasix.

## 2010-09-17 NOTE — Assessment & Plan Note (Signed)
Check TSH. Continue meds as up to now.

## 2010-09-17 NOTE — Assessment & Plan Note (Signed)
Elevated today.  Recheck next visit, advised patient to keep track at home.  May need increase in meds to attain goal <130/80

## 2010-09-24 ENCOUNTER — Telehealth: Payer: Self-pay | Admitting: *Deleted

## 2010-09-24 NOTE — Telephone Encounter (Signed)
Opened in error

## 2010-10-02 ENCOUNTER — Encounter: Payer: Self-pay | Admitting: Family Medicine

## 2010-10-21 ENCOUNTER — Ambulatory Visit: Payer: Medicare HMO | Admitting: Family Medicine

## 2010-10-21 DIAGNOSIS — Z0289 Encounter for other administrative examinations: Secondary | ICD-10-CM

## 2010-12-07 ENCOUNTER — Other Ambulatory Visit: Payer: Self-pay | Admitting: Family Medicine

## 2011-02-08 ENCOUNTER — Other Ambulatory Visit: Payer: Self-pay | Admitting: Family Medicine

## 2011-02-09 ENCOUNTER — Telehealth: Payer: Self-pay | Admitting: *Deleted

## 2011-02-09 NOTE — Telephone Encounter (Signed)
Filled paperwork and placed in my out box.

## 2011-02-09 NOTE — Telephone Encounter (Signed)
Form for diabetic supplies is in your in box.  I checked with the patient and he does use this company.

## 2011-02-10 NOTE — Telephone Encounter (Signed)
Form faxed

## 2011-02-22 ENCOUNTER — Telehealth: Payer: Self-pay | Admitting: *Deleted

## 2011-02-22 NOTE — Telephone Encounter (Signed)
[employer] called stating that they received a form faxed back to them stating that patient did not want supplies from them. They are faxing this back to the office because patient does want these supplies from them. Patient was told by [facility] to use them. If you have any questions call [employer] back at [phone].

## 2011-02-23 ENCOUNTER — Telehealth: Payer: Self-pay | Admitting: *Deleted

## 2011-02-23 NOTE — Telephone Encounter (Signed)
Will await form

## 2011-02-23 NOTE — Telephone Encounter (Signed)
Form for diabetic supplies is on your desk.  Pt says he does want to use this company.

## 2011-02-23 NOTE — Telephone Encounter (Signed)
Form was filled out 02/09/2011 and faxed back.  Will await rpt form.Marland Kitchen

## 2011-02-24 NOTE — Telephone Encounter (Signed)
Filled and placed in my out box. 

## 2011-05-12 ENCOUNTER — Other Ambulatory Visit: Payer: Self-pay | Admitting: Family Medicine

## 2011-05-14 ENCOUNTER — Ambulatory Visit: Payer: Medicare HMO | Admitting: Family Medicine

## 2011-05-21 ENCOUNTER — Ambulatory Visit: Payer: Medicare HMO | Admitting: Family Medicine

## 2011-05-25 ENCOUNTER — Ambulatory Visit: Payer: Medicare HMO | Admitting: Family Medicine

## 2011-05-31 ENCOUNTER — Encounter: Payer: Self-pay | Admitting: Family Medicine

## 2011-05-31 ENCOUNTER — Other Ambulatory Visit: Payer: Self-pay | Admitting: Family Medicine

## 2011-05-31 ENCOUNTER — Ambulatory Visit (INDEPENDENT_AMBULATORY_CARE_PROVIDER_SITE_OTHER): Payer: Medicare HMO | Admitting: Family Medicine

## 2011-05-31 VITALS — BP 160/90 | HR 80 | Temp 97.4°F | Wt >= 6400 oz

## 2011-05-31 DIAGNOSIS — I10 Essential (primary) hypertension: Secondary | ICD-10-CM

## 2011-05-31 DIAGNOSIS — M7989 Other specified soft tissue disorders: Secondary | ICD-10-CM

## 2011-05-31 DIAGNOSIS — Z125 Encounter for screening for malignant neoplasm of prostate: Secondary | ICD-10-CM

## 2011-05-31 DIAGNOSIS — E785 Hyperlipidemia, unspecified: Secondary | ICD-10-CM

## 2011-05-31 DIAGNOSIS — E119 Type 2 diabetes mellitus without complications: Secondary | ICD-10-CM

## 2011-05-31 DIAGNOSIS — I890 Lymphedema, not elsewhere classified: Secondary | ICD-10-CM

## 2011-05-31 DIAGNOSIS — R609 Edema, unspecified: Secondary | ICD-10-CM

## 2011-05-31 DIAGNOSIS — E669 Obesity, unspecified: Secondary | ICD-10-CM

## 2011-05-31 DIAGNOSIS — E039 Hypothyroidism, unspecified: Secondary | ICD-10-CM

## 2011-05-31 LAB — TSH: TSH: 1.7 u[IU]/mL (ref 0.35–5.50)

## 2011-05-31 NOTE — Assessment & Plan Note (Addendum)
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 check TSH again today.  Given asymmetrical swelling, check dopplers of right leg. Discussed elevation of leg and improtance of daily BP med compliance. Pt not currently taking lasix 2/2 polyuria.
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
## 2011-05-31 NOTE — Assessment & Plan Note (Signed)
Continue regimen.  Reassess when returns.

## 2011-05-31 NOTE — Assessment & Plan Note (Signed)
Elevated, did not take meds this morning. Goal <130/80 given DM dx.

## 2011-05-31 NOTE — Patient Instructions (Signed)
Blood work Production manager by [facility] office to schedule ultrasound. Return at your convenience for physical, prior fasting for blood work. Good to see you today, call us with quesitons.

## 2011-05-31 NOTE — Assessment & Plan Note (Signed)
Recheck TSH today.  

## 2011-05-31 NOTE — Assessment & Plan Note (Signed)
Wt Readings from Last 3 Encounters:  05/31/11 463 lb (210.015 kg)  09/17/10 462 lb 1.9 oz (209.616 kg)  04/13/10 450 lb (204.119 kg)   weight increased

## 2011-06-01 ENCOUNTER — Encounter: Payer: Self-pay | Admitting: Family Medicine

## 2011-06-01 ENCOUNTER — Ambulatory Visit: Payer: Self-pay | Admitting: Family Medicine

## 2011-06-01 ENCOUNTER — Telehealth: Payer: Self-pay | Admitting: *Deleted

## 2011-06-01 NOTE — Telephone Encounter (Signed)
Lupita Leash from [facility] [place] called and said [place] was negative for DVT. Advised her to send patient home and we would call him with results. Requested results to be faxed to the office.

## 2011-06-01 NOTE — Telephone Encounter (Signed)
Discussed normal prelim result (no actual report available yet). rec leg elevation and compliacne with meds. Try 1/2 pill lasix tor better control with polyuria. rec schedule appt with me in 1-2 mo, pt states will call to make appt.

## 2011-06-02 ENCOUNTER — Other Ambulatory Visit: Payer: Self-pay | Admitting: *Deleted

## 2011-06-02 MED ORDER — TAMSULOSIN HCL 0.4 MG PO CAPS: 0.4000 mg | ORAL_CAPSULE | Freq: Every day | ORAL | Status: DC

## 2011-06-02 MED ORDER — POTASSIUM CHLORIDE ER 10 MEQ PO TBCR: 10.0000 meq | EXTENDED_RELEASE_TABLET | Freq: Every day | ORAL | Status: DC

## 2011-06-07 ENCOUNTER — Other Ambulatory Visit: Payer: Self-pay | Admitting: *Deleted

## 2011-06-07 MED ORDER — METOPROLOL TARTRATE 50 MG PO TABS: 50.0000 mg | ORAL_TABLET | Freq: Two times a day (BID) | ORAL | Status: DC

## 2011-06-07 MED ORDER — QUINAPRIL HCL 20 MG PO TABS: 20.0000 mg | ORAL_TABLET | Freq: Two times a day (BID) | ORAL | Status: DC

## 2011-06-07 MED ORDER — SIMVASTATIN 20 MG PO TABS: 20.0000 mg | ORAL_TABLET | Freq: Every day | ORAL | Status: DC

## 2011-06-07 NOTE — Telephone Encounter (Signed)
Received faxed refill request from pharmacy. Refills sent to pharmacy electronically. 

## 2011-06-09 ENCOUNTER — Other Ambulatory Visit: Payer: Self-pay | Admitting: Family Medicine

## 2011-06-11 ENCOUNTER — Other Ambulatory Visit: Payer: Self-pay | Admitting: *Deleted

## 2011-06-11 MED ORDER — AMMONIUM LACTATE 12 % EX CREA: TOPICAL_CREAM | CUTANEOUS | Status: DC | PRN

## 2011-06-22 ENCOUNTER — Emergency Department: Payer: Self-pay | Admitting: Emergency Medicine

## 2011-07-26 ENCOUNTER — Other Ambulatory Visit: Payer: Self-pay | Admitting: Family Medicine

## 2011-08-25 ENCOUNTER — Other Ambulatory Visit: Payer: Self-pay | Admitting: Family Medicine

## 2011-10-09 ENCOUNTER — Other Ambulatory Visit: Payer: Self-pay | Admitting: Family Medicine

## 2011-10-17 ENCOUNTER — Other Ambulatory Visit: Payer: Self-pay | Admitting: Family Medicine

## 2011-10-17 NOTE — Telephone Encounter (Signed)
Refilled meds however needs OV for f/u or physical and blood work prior.

## 2011-10-18 NOTE — Telephone Encounter (Signed)
Patient notified and Medicare Wellness and labs scheduled.

## 2011-12-02 ENCOUNTER — Other Ambulatory Visit (INDEPENDENT_AMBULATORY_CARE_PROVIDER_SITE_OTHER): Payer: Medicare HMO

## 2011-12-02 ENCOUNTER — Other Ambulatory Visit: Payer: Self-pay

## 2011-12-02 DIAGNOSIS — E785 Hyperlipidemia, unspecified: Secondary | ICD-10-CM

## 2011-12-02 DIAGNOSIS — Z125 Encounter for screening for malignant neoplasm of prostate: Secondary | ICD-10-CM

## 2011-12-02 DIAGNOSIS — E119 Type 2 diabetes mellitus without complications: Secondary | ICD-10-CM

## 2011-12-02 LAB — MICROALBUMIN / CREATININE URINE RATIO
Creatinine,U: 296.5 mg/dL
Microalb, Ur: 4.4 mg/dL — ABNORMAL HIGH (ref 0.0–1.9)

## 2011-12-02 LAB — BASIC METABOLIC PANEL
BUN: 10 mg/dL (ref 6–23)
CO2: 30 mEq/L (ref 19–32)
Calcium: 9.5 mg/dL (ref 8.4–10.5)
Chloride: 104 mEq/L (ref 96–112)
Creatinine, Ser: 0.9 mg/dL (ref 0.4–1.5)
Glucose, Bld: 99 mg/dL (ref 70–99)

## 2011-12-02 LAB — LIPID PANEL
Cholesterol: 153 mg/dL (ref 0–200)
LDL Cholesterol: 83 mg/dL (ref 0–99)
Total CHOL/HDL Ratio: 3
VLDL: 15.6 mg/dL (ref 0.0–40.0)

## 2011-12-02 NOTE — Telephone Encounter (Signed)
Pt left levemir in [place]. [facility]; spoke with Heidi. Pt will pick up at [facility].

## 2011-12-06 ENCOUNTER — Encounter: Payer: Self-pay | Admitting: Family Medicine

## 2011-12-06 ENCOUNTER — Ambulatory Visit (INDEPENDENT_AMBULATORY_CARE_PROVIDER_SITE_OTHER): Payer: Medicare HMO | Admitting: Family Medicine

## 2011-12-06 VITALS — BP 144/90 | HR 84 | Temp 97.7°F | Wt >= 6400 oz

## 2011-12-06 DIAGNOSIS — I10 Essential (primary) hypertension: Secondary | ICD-10-CM

## 2011-12-06 DIAGNOSIS — Z0000 Encounter for general adult medical examination without abnormal findings: Secondary | ICD-10-CM

## 2011-12-06 DIAGNOSIS — E669 Obesity, unspecified: Secondary | ICD-10-CM

## 2011-12-06 DIAGNOSIS — E119 Type 2 diabetes mellitus without complications: Secondary | ICD-10-CM

## 2011-12-06 DIAGNOSIS — E785 Hyperlipidemia, unspecified: Secondary | ICD-10-CM

## 2011-12-06 DIAGNOSIS — Z23 Encounter for immunization: Secondary | ICD-10-CM

## 2011-12-06 NOTE — Assessment & Plan Note (Signed)
Discussed healthy diet/lifestyle. Discussed starting walking daily. Discussed diet changes - ie more water, less juices.  More fruits/vegetables.

## 2011-12-06 NOTE — Assessment & Plan Note (Signed)
I have personally reviewed the Medicare Annual Wellness questionnaire and have noted 1. The patient's medical and social history 2. Their use of alcohol, tobacco or illicit drugs 3. Their current medications and supplements 4. The patient's functional ability including ADL's, fall risks, home safety risks and hearing or visual impairment. 5. Diet and physical activity 6. Evidence for depression or mood disorders The patients weight, height, BMI have been recorded in the chart.  Hearing and vision has been addressed. I have made referrals, counseling and provided education to the patient based review of the above and I have provided the pt with a written personalized care plan for preventive services. See scanned questionairre.  Reviewed preventative protocols and updated unless pt declined. Td today. UTD otherwise.

## 2011-12-06 NOTE — Assessment & Plan Note (Signed)
Bp elevated today - did not take am meds. Will go home and take. O/w, stable. Chronic.

## 2011-12-06 NOTE — Progress Notes (Signed)
Subjective:    Patient ID: Derek Bass, male    DOB: 03-Dec-1940, 71 y.o.   MRN: [id number]  HPI CC: medicare wellness visit  bp elevated today - did not have breakfast so didn't take am meds yet.  Attributes to this.  Recently lasix changed to spironolactone (by [facility]  On calcium for h/o goiter removed, I presume parathyroid gland removed as well.  H/o knee arthritis, takes tylenol for this.  DM - sugars well controlled.  Eye exam 07/2011.  Checks sugars daily.  No lows.  Last foot exam was 08/2011 by podiatry. Lab Results  Component Value Date   HGBA1C 6.8* 12/02/2011    Preventative: No recent CPE. H/o polyps in colon, last colonoscopy was 2012 in [place], Dr. Elnoria Howard. Told looking ok rec rpt 3 or 54yrs put unsure. Prostate - h/o cancer, unsure who he's followed by.  Thinks [facility].  Has radiation seed implants.  Has seen Dr. Myrtie Soman in past. Shingles and Pneumonia shots - according to our charts has had 2011. Tetanus - would like today.  1 fall in last year, did not injure himself.  Denies depression, anhedonia.  Vision screen - failed on one eye. Hearing screen - passed today.  Caffeine: 1 cup/day coffee, soda/lemonade with food Lives alone. Family in area. 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  2 biological children, 4 step children Activity: no regular exercise Diet: seldom water, fruits/vegetables seldom  Wt Readings from Last 3 Encounters:  12/06/11 461 lb (209.108 kg)  05/31/11 463 lb (210.015 kg)  09/17/10 462 lb 1.9 oz (209.616 kg)   Medications and allergies reviewed and updated in chart.  Past histories reviewed and updated if relevant as below. Patient Active Problem List  Diagnosis  . DIABETES MELLITUS, TYPE II, CONTROLLED  . DYSLIPIDEMIA  . LIPODYSTROPHY  . Unspecified essential hypertension  . Unspecified dental caries  . LEG  EDEMA, BILATERAL  . PERS HX NONCOMPLIANCE W/MED TX PRS HAZARDS HLTH  . Hypothyroid  . Obesity   Past Medical History  Diagnosis Date  . T2DM (type 2 diabetes mellitus)   . HTN (hypertension)   . HLD (hyperlipidemia)   . Colon polyps 04/2010    tubular adenomas, rpt colonoscopy 3 years Elnoria Howard)  . Hypothyroid   . Hepatitis 1961    when in service, thinks viral  . Prostate cancer   . Vitamin d deficiency   . Obesity    Past Surgical History  Procedure Date  . Laparoscopic gastric banding 2001    [place], [facility]  . Goiter removed 2002    on calcium ever since  . Insertion prostate radiation seed 2003  . Colonoscopy 2012    tubular adenoma mult polyps, int/ext hemorrhoids, diverticula, rpt 3 yeras   History  Substance Use Topics  . Smoking status: Former Smoker    Quit date: 01/27/1985  . Smokeless tobacco: Never Used  . Alcohol Use: No   Family History  Problem Relation Age of Onset  . Coronary artery disease Mother 72  . Prostate cancer Brother   . Diabetes Brother   . Stroke Neg Hx    No Known Allergies Current Outpatient Prescriptions on File Prior to Visit  Medication Sig Dispense Refill  . ammonium lactate (AMLACTIN) 12 % cream Apply topically as needed.  385 g  2  . calcium carbonate (OS-CAL) 600 MG TABS Take 500  mg by mouth daily.      Marland Kitchen HUMULIN R 100 UNIT/ML injection INJECT AS DIRECTED  10 mL  11  . LEVEMIR FLEXPEN 100 UNIT/ML injection INJECT 45 UNITS INTO THE SKIN AT BEDTIME.  15 Syringe  11  . levothyroxine (SYNTHROID, LEVOTHROID) 200 MCG tablet Take 1 tablet (200 mcg total) by mouth daily.  90 tablet  3  . metFORMIN (GLUCOPHAGE) 1000 MG tablet TAKE 1 TABLET BY MOUTH 2 TIMES A DAY WITH MEALS  180 tablet  0  . metoprolol (LOPRESSOR) 50 MG tablet TAKE 1 TABLET BY MOUTH 2 TIMES A DAY  60 tablet  5  . quinapril (ACCUPRIL) 20 MG tablet TAKE 1 TABLET BY MOUTH TWICE DAILY  60 tablet  5  . simvastatin (ZOCOR) 20 MG tablet TAKE 1 TABLET BY MOUTH AT BEDTIME  30 tablet   5  . spironolactone (ALDACTONE) 25 MG tablet TAKE 1 TABLET BY MOUTH 2 TIMES A DAY  60 tablet  3  . Tamsulosin HCl (FLOMAX) 0.4 MG CAPS TAKE 1 CAPSULE (0.4 MG TOTAL) BY MOUTH DAILY.  30 capsule  5  . urea (CARMOL) 40 % CREA Apply topically daily.           Review of Systems  Constitutional: Negative for fever, chills, activity change, appetite change, fatigue and unexpected weight change.  HENT: Negative for hearing loss and neck pain.   Eyes: Negative for visual disturbance.  Respiratory: Negative for cough, chest tightness, shortness of breath and wheezing.   Cardiovascular: Negative for chest pain, palpitations and leg swelling.  Gastrointestinal: Positive for diarrhea (mild, controlled with immodium). Negative for nausea, vomiting, abdominal pain, constipation, blood in stool and abdominal distention.  Genitourinary: Negative for hematuria and difficulty urinating.  Musculoskeletal: Negative for myalgias and arthralgias.  Skin: Negative for rash.  Neurological: Negative for dizziness, seizures, syncope and headaches.  Hematological: Does not bruise/bleed easily.  Psychiatric/Behavioral: Negative for dysphoric mood. The patient is not nervous/anxious.        Objective:   Physical Exam  Nursing note and vitals reviewed. Constitutional: He is oriented to person, place, and time. He appears well-developed and well-nourished. No distress.       obese  HENT:  Head: Normocephalic and atraumatic.  Right Ear: Hearing, tympanic membrane, external ear and ear canal normal.  Left Ear: Hearing, tympanic membrane, external ear and ear canal normal.  Nose: Nose normal.  Mouth/Throat: Oropharynx is clear and moist. No oropharyngeal exudate.  Eyes: Conjunctivae and EOM are normal. Pupils are equal, round, and reactive to light. No scleral icterus.  Neck: Normal range of motion. Neck supple. No thyromegaly present.  Cardiovascular: Normal rate, regular rhythm, normal heart sounds and intact  distal pulses.   No murmur heard. Pulses:      Radial pulses are 2+ on the right side, and 2+ on the left side.  Pulmonary/Chest: Effort normal and breath sounds normal. No respiratory distress. He has no wheezes. He has no rales.  Abdominal: Soft. Bowel sounds are normal. He exhibits no distension and no mass. There is no tenderness. There is no rebound and no guarding.  Genitourinary: Rectum normal and prostate normal. Rectal exam shows no external hemorrhoid, no internal hemorrhoid, no fissure, no mass, no tenderness and anal tone normal. Prostate is not enlarged (20gm) and not tender.  Musculoskeletal: Normal range of motion. He exhibits edema (lymphedema).  Lymphadenopathy:    He has no cervical adenopathy.  Neurological: He is alert and oriented to person, place, and time.  CN grossly intact, station and gait intact  Skin: Skin is warm and dry. No rash noted.  Psychiatric: He has a normal mood and affect. His behavior is normal. Judgment and thought content normal.       Assessment & Plan:

## 2011-12-06 NOTE — Patient Instructions (Addendum)
Call [facility] to follow up with them for your history of prostate cancer. Tetanus shot today. Start walking daily - make sure to walk with cane. Try to get more fruits/vegetables daily Good to see you today, call us with quesitons.

## 2011-12-06 NOTE — Assessment & Plan Note (Signed)
Chronic, stable. Continue simvastatin.  

## 2011-12-06 NOTE — Addendum Note (Signed)
Addended by: Josph Macho A on: 12/06/2011 09:44 AM   Modules accepted: Orders

## 2011-12-06 NOTE — Assessment & Plan Note (Signed)
Chronic, stable. Continue meds. 

## 2011-12-21 ENCOUNTER — Other Ambulatory Visit: Payer: Self-pay | Admitting: Family Medicine

## 2012-01-12 ENCOUNTER — Encounter: Payer: Self-pay | Admitting: Family Medicine

## 2012-01-12 ENCOUNTER — Ambulatory Visit (INDEPENDENT_AMBULATORY_CARE_PROVIDER_SITE_OTHER): Payer: Medicare HMO | Admitting: Family Medicine

## 2012-01-12 VITALS — BP 142/86 | HR 88 | Temp 98.0°F | Wt >= 6400 oz

## 2012-01-12 DIAGNOSIS — IMO0001 Reserved for inherently not codable concepts without codable children: Secondary | ICD-10-CM

## 2012-01-12 DIAGNOSIS — M7918 Myalgia, other site: Secondary | ICD-10-CM | POA: Insufficient documentation

## 2012-01-12 DIAGNOSIS — Z23 Encounter for immunization: Secondary | ICD-10-CM

## 2012-01-12 NOTE — Progress Notes (Signed)
  Subjective:    Patient ID: Derek Bass, male    DOB: [birth year], 71 y.o.   MRN: [id number]  HPI CC: R hip pain  Didn't take bp meds this morning.  Has been trying to get more exercise, eating more greens, avoiding juices and other liquid calories. Lab Results  Component Value Date   HGBA1C 6.8* 12/02/2011   Wt Readings from Last 3 Encounters:  01/12/12 445 lb 8 oz (202.077 kg)  12/06/11 461 lb (209.108 kg)  05/31/11 463 lb (210.015 kg)  16lb weight loss in 1 mo!  Noticing R hip pain described as sharp stabbing pain "on fleshy part of buttocks" that comes on after walking for 30 min (ie walking in store).  Has happened 3 times when shopping for groceries.  Present for a minute, then resolves.    Denies falls, injury.  Denies lower back pain.  Denies shooting pain down leg or numbness/weakness.  Denies fevers/chills.  Review of Systems Per HPI    Objective:   Physical Exam  Nursing note and vitals reviewed. Constitutional: He is oriented to person, place, and time. He appears well-developed and well-nourished. No distress.       Morbidly obese  Musculoskeletal: He exhibits no edema.       No midline spine tenderness, no paraspinous mm tenderness. Neg SLR bilaterally No pain with int/ext rotation at hip.  Neg FABER. No pain at GTB or SIJ or sciatic notch bilaterally, but states pain is at right sciatic notch when comes on.  Neurological: He is alert and oriented to person, place, and time. He has normal strength. No sensory deficit.       Diminished DTRs bilaterally symmetrically 5/5 strength BLE       Assessment & Plan:

## 2012-01-12 NOTE — Assessment & Plan Note (Signed)
Anticipate component of right sciatica along piriformis muscle brought on by increased exertion that body was not used to (more walking than normal at grocery store) Discussed this, treat with stretching exercises for sciatica. Slowly increase activity. Pt agrees with plan. Benign exam today. Encouraged continued weight loss.

## 2012-01-12 NOTE — Patient Instructions (Addendum)
Flu shot today. I do think you have occasional pinching of nerve as it crosses the buttock area. Stretching exercises for piriformis muscle provided - do these as able at home. If not improving, let me know. Congratulations on good sugars and weight loss!!

## 2012-02-06 ENCOUNTER — Other Ambulatory Visit: Payer: Self-pay | Admitting: Family Medicine

## 2012-03-06 ENCOUNTER — Encounter: Payer: Self-pay | Admitting: Family Medicine

## 2012-03-06 ENCOUNTER — Ambulatory Visit (INDEPENDENT_AMBULATORY_CARE_PROVIDER_SITE_OTHER): Payer: Medicare HMO | Admitting: Family Medicine

## 2012-03-06 VITALS — BP 140/86 | HR 88 | Temp 97.9°F | Wt >= 6400 oz

## 2012-03-06 DIAGNOSIS — E119 Type 2 diabetes mellitus without complications: Secondary | ICD-10-CM

## 2012-03-06 DIAGNOSIS — E669 Obesity, unspecified: Secondary | ICD-10-CM

## 2012-03-06 DIAGNOSIS — R609 Edema, unspecified: Secondary | ICD-10-CM

## 2012-03-06 DIAGNOSIS — E785 Hyperlipidemia, unspecified: Secondary | ICD-10-CM

## 2012-03-06 DIAGNOSIS — I10 Essential (primary) hypertension: Secondary | ICD-10-CM

## 2012-03-06 MED ORDER — METOPROLOL TARTRATE 50 MG PO TABS: 75.0000 mg | ORAL_TABLET | Freq: Two times a day (BID) | ORAL | Status: DC

## 2012-03-06 NOTE — Progress Notes (Signed)
  Subjective:    Patient ID: Derek Bass, male    DOB: 09/19/1940, 71 y.o.   MRN: [id number]  HPI CC: 3 mo f/u  DM - sugars running high some last 2 weeks. Eye exam 07/2011. Checks sugars daily. No lows. Last foot exam was 08/2011 by podiatry.  Compliant with metformin 1000mg  bid.    HTN - reports taking most of his meds this morning.  doesn't check bp at home.  No HA, vision changes, CP/tightness, SOB, leg swelling.  BP Readings from Last 3 Encounters:  03/06/12 140/86  01/12/12 142/86  12/06/11 144/90    HLD - compliant with simvastatin 20mg  daily.  Normally has breakfast at [employer], bagel and coffee.  Drinks one large coffee daily.  1 fall in last year, did not injure himself.  Has not scheduled f/u with Dr. Myrtie Soman.  Wt Readings from Last 3 Encounters:  03/06/12 453 lb (205.479 kg)  01/12/12 445 lb 8 oz (202.077 kg)  12/06/11 461 lb (209.108 kg)   Lab Results  Component Value Date   PSA 0.70 12/02/2011   PSA 0.76 04/13/2010  Has not f/u with urology.  Review of Systems Per HPI    Objective:   Physical Exam  Nursing note and vitals reviewed. Constitutional: He appears well-developed and well-nourished. No distress.       Morbidly obese  HENT:  Head: Normocephalic and atraumatic.  Mouth/Throat: Oropharynx is clear and moist. No oropharyngeal exudate.       Poor dentition  Eyes: Conjunctivae normal and EOM are normal. Pupils are equal, round, and reactive to light. No scleral icterus.  Neck: Normal range of motion. Neck supple.  Cardiovascular: Normal rate, regular rhythm, normal heart sounds and intact distal pulses.   No murmur heard. Pulmonary/Chest: Effort normal and breath sounds normal. No respiratory distress. He has no wheezes. He has no rales.  Musculoskeletal: He exhibits edema (lymphedema, hardened skin R>L).  Psychiatric: He has a normal mood and affect.       Assessment & Plan:

## 2012-03-06 NOTE — Assessment & Plan Note (Signed)
10lb weight gain noted.  Continue to monitor.

## 2012-03-06 NOTE — Assessment & Plan Note (Signed)
Chronic, table.  Check A1c today. Advised change levemir to am daily as pt remembers in am better.

## 2012-03-06 NOTE — Assessment & Plan Note (Signed)
Chronic, staying mildly elevated. Increase metoprolol to 75mg  bid.  HR should tolerate increase.

## 2012-03-06 NOTE — Assessment & Plan Note (Signed)
Chronic, stable.  Continue ace bandage. Chronic lymphedema R>L.

## 2012-03-06 NOTE — Assessment & Plan Note (Signed)
Chronic, stable. Continue simvastatin.  

## 2012-03-06 NOTE — Patient Instructions (Addendum)
Blood work today. Increase metoprolol to 1 1/2 pills twice a daily.  I've sent this to pharmacy. Return in 4 months for follow up. Schedule appointment with urologist (Dr. Myrtie Soman)

## 2012-03-07 ENCOUNTER — Encounter: Payer: Self-pay | Admitting: *Deleted

## 2012-03-16 ENCOUNTER — Telehealth: Payer: Self-pay

## 2012-03-16 NOTE — Telephone Encounter (Signed)
Junious Dresser with [employer] called to ck status on request for diabetic supplies 01/02/12. Cannot find request and asked to resubmit.

## 2012-03-24 ENCOUNTER — Telehealth: Payer: Self-pay

## 2012-03-24 NOTE — Telephone Encounter (Signed)
Derek Bass with [employer] [facility] left v/m requesting status of order for diabetic supplies faxed on 03/16/12.Please advise.

## 2012-03-27 NOTE — Telephone Encounter (Signed)
Have not received order for supplies. Junious Dresser notified and will fax another order.

## 2012-05-16 ENCOUNTER — Other Ambulatory Visit: Payer: Self-pay | Admitting: Family Medicine

## 2012-07-05 ENCOUNTER — Encounter: Payer: Self-pay | Admitting: Family Medicine

## 2012-07-05 ENCOUNTER — Ambulatory Visit (INDEPENDENT_AMBULATORY_CARE_PROVIDER_SITE_OTHER): Payer: Medicare HMO | Admitting: Family Medicine

## 2012-07-05 VITALS — BP 136/84 | HR 104 | Temp 98.2°F | Wt >= 6400 oz

## 2012-07-05 DIAGNOSIS — I10 Essential (primary) hypertension: Secondary | ICD-10-CM

## 2012-07-05 DIAGNOSIS — E785 Hyperlipidemia, unspecified: Secondary | ICD-10-CM

## 2012-07-05 DIAGNOSIS — E039 Hypothyroidism, unspecified: Secondary | ICD-10-CM

## 2012-07-05 DIAGNOSIS — E119 Type 2 diabetes mellitus without complications: Secondary | ICD-10-CM

## 2012-07-05 LAB — HEMOGLOBIN A1C: Hgb A1c MFr Bld: 7.2 % — ABNORMAL HIGH (ref 4.6–6.5)

## 2012-07-05 MED ORDER — METOPROLOL TARTRATE 100 MG PO TABS: 100.0000 mg | ORAL_TABLET | Freq: Two times a day (BID) | ORAL | Status: DC

## 2012-07-05 NOTE — Patient Instructions (Addendum)
Good to see you today.  Blood work today. Let's increase metoprolol to 100mg  twice daily. Check into splenda for sugar alternative. Good to see you today, call us with questions. Return in 4 months for follow up.

## 2012-07-05 NOTE — Assessment & Plan Note (Signed)
Chronic, stable. Continue meds.  Check A1c today.

## 2012-07-05 NOTE — Assessment & Plan Note (Signed)
Check TSH today. Continue synthroid. 

## 2012-07-05 NOTE — Assessment & Plan Note (Signed)
Chronic, stable. Could tolerate increase in metoprolol - increased today to 100mg  bid.

## 2012-07-05 NOTE — Assessment & Plan Note (Signed)
Chronic, stable. Tolerating well.  Continue statin.

## 2012-07-05 NOTE — Progress Notes (Signed)
  Subjective:    Patient ID: Derek Bass, male    DOB: 1940-03-30, 72 y.o.   MRN: [id number]  HPI CC: 18mo f/u  DM - sugars running high some last 2 weeks. Eye exam 07/2011. Checks sugars daily. No lows. Last foot exam was 08/2011 by podiatry. Compliant with metformin 1000mg  bid, levemir and regular with mealtime.  Tries to avoid juices and sodas.  Tries to keep legs elevated.  HTN - doesn't check bp at home. No HA, vision changes, CP/tightness, SOB, leg swelling.   HLD - compliant with simvastatin. No myalgias  Hypothyroid - compliant with levothyroxine daily.  Normally has breakfast at [employer], bagel and coffee. Drinks one large coffee daily.  H/o prostate cancer.  Saw urologist Myrtie Soman - told things looking good.  Told PSA stable.  F/u appt 06/11/2013. Lab Results  Component Value Date   PSA 0.70 12/02/2011   PSA 0.76 04/13/2010   Last CPE 11/2011  BP Readings from Last 3 Encounters:  07/05/12 136/84  03/06/12 140/86  01/12/12 142/86    Wt Readings from Last 3 Encounters:  07/05/12 444 lb (201.397 kg)  03/06/12 453 lb (205.479 kg)  01/12/12 445 lb 8 oz (202.077 kg)    Lab Results  Component Value Date   HGBA1C 7.0* 03/06/2012    Past Medical History  Diagnosis Date  . T2DM (type 2 diabetes mellitus)   . HTN (hypertension)   . HLD (hyperlipidemia)   . Colon polyps 04/2010    tubular adenomas, rpt colonoscopy 3 years Elnoria Howard)  . Hypothyroid   . Hepatitis 1961    when in service, thinks viral  . Prostate cancer   . Vitamin D deficiency   . Obesity     Review of Systems Per HPI    Objective:   Physical Exam  Nursing note and vitals reviewed. Constitutional: He appears well-developed and well-nourished. No distress.  Morbidly obese  HENT:  Head: Normocephalic and atraumatic.  Nose: Nose normal.  Mouth/Throat: Oropharynx is clear and moist. No oropharyngeal exudate.  Eyes: Conjunctivae and EOM are normal. Pupils are equal, round, and reactive to light.  No scleral icterus.  Neck: Normal range of motion. Neck supple. Carotid bruit is not present. No thyromegaly present.  Cardiovascular: Normal rate, regular rhythm, normal heart sounds and intact distal pulses.   No murmur heard. Pulmonary/Chest: Effort normal and breath sounds normal. No respiratory distress. He has no wheezes. He has no rales.  Musculoskeletal: He exhibits edema (lymphedema).  Diabetic foot exam: No skin breakdown Dry scaly skin on soles No calluses  Normal DP/PT pulses Normal sensation to light touch and monofilament Nails thickened, big nails onychomycotic  Lymphadenopathy:    He has no cervical adenopathy.  Skin: Skin is warm and dry. No rash noted.  Psychiatric: He has a normal mood and affect.       Assessment & Plan:

## 2012-07-06 ENCOUNTER — Encounter: Payer: Self-pay | Admitting: *Deleted

## 2012-07-25 ENCOUNTER — Other Ambulatory Visit: Payer: Self-pay | Admitting: Family Medicine

## 2012-07-28 ENCOUNTER — Other Ambulatory Visit: Payer: Self-pay | Admitting: Family Medicine

## 2012-08-23 ENCOUNTER — Other Ambulatory Visit: Payer: Self-pay | Admitting: Family Medicine

## 2012-08-23 MED ORDER — TAMSULOSIN HCL 0.4 MG PO CAPS: ORAL_CAPSULE | ORAL | Status: DC

## 2012-08-30 ENCOUNTER — Other Ambulatory Visit: Payer: Self-pay | Admitting: Family Medicine

## 2012-09-19 ENCOUNTER — Encounter: Payer: Self-pay | Admitting: Family Medicine

## 2012-09-19 ENCOUNTER — Other Ambulatory Visit: Payer: Self-pay | Admitting: Family Medicine

## 2012-09-19 ENCOUNTER — Ambulatory Visit (INDEPENDENT_AMBULATORY_CARE_PROVIDER_SITE_OTHER): Payer: Medicare HMO | Admitting: Family Medicine

## 2012-09-19 VITALS — BP 122/84 | HR 82 | Temp 98.3°F | Wt >= 6400 oz

## 2012-09-19 DIAGNOSIS — R609 Edema, unspecified: Secondary | ICD-10-CM

## 2012-09-19 DIAGNOSIS — R0600 Dyspnea, unspecified: Secondary | ICD-10-CM

## 2012-09-19 DIAGNOSIS — R0609 Other forms of dyspnea: Secondary | ICD-10-CM

## 2012-09-19 DIAGNOSIS — D179 Benign lipomatous neoplasm, unspecified: Secondary | ICD-10-CM

## 2012-09-19 DIAGNOSIS — R350 Frequency of micturition: Secondary | ICD-10-CM

## 2012-09-19 LAB — BASIC METABOLIC PANEL
BUN: 15 mg/dL (ref 6–23)
Chloride: 104 mEq/L (ref 96–112)
Glucose, Bld: 137 mg/dL — ABNORMAL HIGH (ref 70–99)
Potassium: 4.4 mEq/L (ref 3.5–5.1)

## 2012-09-19 LAB — POCT URINALYSIS DIPSTICK
Bilirubin, UA: NEGATIVE
Blood, UA: NEGATIVE
Glucose, UA: NEGATIVE
Spec Grav, UA: 1.015

## 2012-09-19 MED ORDER — FUROSEMIDE 20 MG PO TABS: 20.0000 mg | ORAL_TABLET | Freq: Every day | ORAL | Status: DC | PRN

## 2012-09-19 NOTE — Patient Instructions (Addendum)
I'd like to get some blood work today. Thyroid was normal last time it was checked. Spots on head look like benign fatty tumors. If kidney function ok, I will send in a prescription for some lasix for leg swelling as needed. Let us know if not improving with this.

## 2012-09-19 NOTE — Progress Notes (Signed)
  Subjective:    Patient ID: Derek Bass, male    DOB: [birth year], 72 y.o.   MRN: [id number]  HPI CC: swelling, increased urination.  Derek Bass presents today to discuss concerns with increasing edema noted.  Notes increased swelling R>L over last few months.  Trying to keep legs elevated.  Avoiding salt and sodium in diet, trying to drink good water daily.  Noticing recently worsening dyspnea.  No chest pain/tightness or palpitations.  Has also noticed increased urination recently - thinks due to spironolactone.  Has been told by urology that bladder doesn't empty completely.  Denies dysuria, hematuria.  + h/o some urge incontinence.  Also wants lumps on head evaluated - told lipomas.   On spironolactone, levemir and humulin R, synthroid , metformin 1000mg  bid, metoprolol 100mg  bid, quinapril 20mg  bid, spironolactone 25mg  bid. BP Readings from Last 3 Encounters:  09/19/12 122/84  07/05/12 136/84  03/06/12 140/86   Wt Readings from Last 3 Encounters:  09/19/12 457 lb 8 oz (207.521 kg)  07/05/12 444 lb (201.397 kg)  03/06/12 453 lb (205.479 kg)    Past Medical History  Diagnosis Date  . T2DM (type 2 diabetes mellitus)   . HTN (hypertension)   . HLD (hyperlipidemia)   . Colon polyps 04/2010    tubular adenomas, rpt colonoscopy 3 years [facility])  . Hypothyroid   . Hepatitis 1961    when in service, thinks viral  . Prostate cancer   . Vitamin D deficiency   . Obesity      Review of Systems Per HPI    Objective:   Physical Exam  Nursing note and vitals reviewed. Constitutional: He appears well-developed and well-nourished. No distress.  HENT:  Mouth/Throat: Oropharynx is clear and moist. No oropharyngeal exudate.  Eyes: Conjunctivae and EOM are normal. Pupils are equal, round, and reactive to light. No scleral icterus.  Neck: Normal range of motion. Neck supple.  Cardiovascular: Normal rate, regular rhythm, normal heart sounds and intact distal pulses.   No  murmur heard. Pulmonary/Chest: Effort normal and breath sounds normal. No respiratory distress. He has no wheezes. He has no rales.  distant  Musculoskeletal: He exhibits edema (lymphedema R>L).  Skin: Skin is warm and dry. No rash noted.      Assessment & Plan:

## 2012-09-19 NOTE — Assessment & Plan Note (Signed)
With dyspnea - check BNP and BMP today. Known chronic lymphedema R>L. If stable Cr, add lasix prn. Pt aware to monitor weight and avoid sodium in diet.

## 2012-09-19 NOTE — Assessment & Plan Note (Signed)
Of left parietal scalp vs cyst. As asxs, will monitor for now.

## 2012-09-22 ENCOUNTER — Encounter: Payer: Self-pay | Admitting: *Deleted

## 2012-10-03 ENCOUNTER — Other Ambulatory Visit: Payer: Self-pay

## 2012-10-03 MED ORDER — SIMVASTATIN 20 MG PO TABS: ORAL_TABLET | ORAL | Status: DC

## 2012-10-03 MED ORDER — FUROSEMIDE 20 MG PO TABS: 20.0000 mg | ORAL_TABLET | Freq: Every day | ORAL | Status: DC | PRN

## 2012-10-03 NOTE — Telephone Encounter (Signed)
[facility] [place] [phone] request refill furosemide and simvastatin # 30 x 0 on each; pharmacy prefers not to transfer refills.

## 2012-10-05 ENCOUNTER — Other Ambulatory Visit: Payer: Self-pay

## 2012-11-06 ENCOUNTER — Ambulatory Visit: Payer: Self-pay | Admitting: Family Medicine

## 2012-11-06 DIAGNOSIS — Z0289 Encounter for other administrative examinations: Secondary | ICD-10-CM

## 2012-11-20 ENCOUNTER — Other Ambulatory Visit: Payer: Self-pay | Admitting: Family Medicine

## 2012-11-20 NOTE — Telephone Encounter (Signed)
[facility] request refill simvastatin. Done # 30 x 5.

## 2012-12-01 ENCOUNTER — Other Ambulatory Visit: Payer: Self-pay | Admitting: Family Medicine

## 2012-12-26 ENCOUNTER — Telehealth: Payer: Self-pay | Admitting: Family Medicine

## 2012-12-26 NOTE — Telephone Encounter (Signed)
Did lasix prescribed 08/2012 help? May schedule f/u when he returns to [facility] unless starts feeling worse.Marland Kitchen

## 2012-12-26 NOTE — Telephone Encounter (Signed)
Patient Information:  Caller Name: Derek Bass  Phone: [phone]  Patient: Derek Bass, Derek Bass  Gender: Male  DOB: 21-Dec-1940  Age: 72 Years  PCP: Derek Bass [facility])  Office Follow Up:  Does the office need to follow up with this patient?: No  Instructions For The Office: N/A   Symptoms  Reason For Call & Symptoms: Patient calling.  Reports he has significant swelling in feet, ankles and calves that started estimated July 2014.  `Shooting pain in left calf onset estimated July 2014.  Emergent symptoms  ruled out.  See provider within 4 hours per nursing judgment.  Caller states he had hoped to wait until his return to [facility].  Reviewed Health History In EMR: Yes  Reviewed Medications In EMR: Yes  Reviewed Allergies In EMR: Yes  Reviewed Surgeries / Procedures: Yes  Date of Onset of Symptoms: Unknown  Treatments Tried: Keeping legs elevated at or above heart level, Lotion  Treatments Tried Worked: No  Guideline(s) Used:  Leg Swelling and Edema  Disposition Per Guideline:   See Within 2 Weeks in Office  Reason For Disposition Reached:   Mild swelling of both ankles and chronic (unchanged)  Advice Given:  Call Back If:  Swelling becomes worse  Swelling becomes red or painful to the touch  Calf pain occurs and becomes constant  You become worse.  RN Overrode Recommendation:  Document Patient  Advised to seek evaluation 12/26/12.  He is currently in [place].

## 2012-12-26 NOTE — Telephone Encounter (Signed)
Attempted to call patient and phone would not "accept calls at this time." Will try again later.

## 2012-12-27 NOTE — Telephone Encounter (Signed)
Attempted to call patient and "phone not accepting calls at this time". Will try again later.

## 2012-12-27 NOTE — Telephone Encounter (Signed)
Phone still "not accepting calls". Will try again later.

## 2012-12-28 NOTE — Telephone Encounter (Addendum)
Spoke with patient. He said the lasix has helped but his left leg is painful on the side of his calf and the skin looks tight. He also says his calf is swollen behind his knee and around the side of it. He denies warmth or redness to the area but says it is painful. He says he is urinating a lot, but still having swelling; no SOB. He said it would be ~2-3 weeks before he could get back to [place] for eval. I advised to go to [facility] in [place] for eval in the meantime and call us with an update. He verbalized understanding and said he would have notes sent to you.

## 2013-01-04 ENCOUNTER — Other Ambulatory Visit: Payer: Self-pay | Admitting: Family Medicine

## 2013-01-13 ENCOUNTER — Other Ambulatory Visit: Payer: Self-pay | Admitting: Family Medicine

## 2013-02-20 ENCOUNTER — Telehealth: Payer: Self-pay | Admitting: *Deleted

## 2013-02-20 MED ORDER — UREA 40 % EX CREA: TOPICAL_CREAM | CUTANEOUS | Status: DC

## 2013-02-20 NOTE — Telephone Encounter (Signed)
Pt request refill Urea 40% cream [facility] [phone].  Dr Dimas Aguas, I reordered.

## 2013-02-27 ENCOUNTER — Other Ambulatory Visit: Payer: Self-pay

## 2013-02-27 NOTE — Telephone Encounter (Signed)
[facility] left v/m requesting refill ammonium lactate. Pt was no show on 11/06/12 for 4 month appt. Please advise.

## 2013-02-28 MED ORDER — AMMONIUM LACTATE 12 % EX CREA: TOPICAL_CREAM | CUTANEOUS | Status: DC

## 2013-03-29 HISTORY — PX: US ECHOCARDIOGRAPHY: HXRAD669

## 2013-04-04 ENCOUNTER — Other Ambulatory Visit: Payer: Self-pay | Admitting: Family Medicine

## 2013-04-09 ENCOUNTER — Encounter: Payer: Self-pay | Admitting: Family Medicine

## 2013-04-09 ENCOUNTER — Ambulatory Visit (INDEPENDENT_AMBULATORY_CARE_PROVIDER_SITE_OTHER): Payer: Medicare HMO | Admitting: Family Medicine

## 2013-04-09 VITALS — BP 134/84 | HR 68 | Temp 98.0°F | Wt >= 6400 oz

## 2013-04-09 DIAGNOSIS — E119 Type 2 diabetes mellitus without complications: Secondary | ICD-10-CM

## 2013-04-09 DIAGNOSIS — R0609 Other forms of dyspnea: Secondary | ICD-10-CM

## 2013-04-09 DIAGNOSIS — R0989 Other specified symptoms and signs involving the circulatory and respiratory systems: Secondary | ICD-10-CM

## 2013-04-09 DIAGNOSIS — E785 Hyperlipidemia, unspecified: Secondary | ICD-10-CM

## 2013-04-09 DIAGNOSIS — R609 Edema, unspecified: Secondary | ICD-10-CM

## 2013-04-09 DIAGNOSIS — I10 Essential (primary) hypertension: Secondary | ICD-10-CM

## 2013-04-09 DIAGNOSIS — R0600 Dyspnea, unspecified: Secondary | ICD-10-CM

## 2013-04-09 DIAGNOSIS — E039 Hypothyroidism, unspecified: Secondary | ICD-10-CM

## 2013-04-09 LAB — COMPREHENSIVE METABOLIC PANEL
ALT: 39 U/L (ref 0–53)
AST: 26 U/L (ref 0–37)
Albumin: 4.1 g/dL (ref 3.5–5.2)
Alkaline Phosphatase: 66 U/L (ref 39–117)
BILIRUBIN TOTAL: 0.7 mg/dL (ref 0.3–1.2)
BUN: 13 mg/dL (ref 6–23)
CO2: 30 meq/L (ref 19–32)
CREATININE: 1 mg/dL (ref 0.4–1.5)
Calcium: 9.7 mg/dL (ref 8.4–10.5)
Chloride: 103 mEq/L (ref 96–112)
GFR: 99.06 mL/min (ref >60.00)
Glucose, Bld: 111 mg/dL — ABNORMAL HIGH (ref 70–99)
Potassium: 4 mEq/L (ref 3.5–5.1)
Sodium: 141 mEq/L (ref 135–145)
Total Protein: 7.9 g/dL (ref 6.0–8.3)

## 2013-04-09 LAB — TSH: TSH: 4.12 u[IU]/mL (ref 0.35–5.50)

## 2013-04-09 LAB — LIPID PANEL
CHOL/HDL RATIO: 3
Cholesterol: 194 mg/dL (ref 0–200)
HDL: 62.4 mg/dL (ref >39.00)
LDL Cholesterol: 113 mg/dL — ABNORMAL HIGH (ref 0–99)
TRIGLYCERIDES: 95 mg/dL (ref 0.0–149.0)
VLDL: 19 mg/dL (ref 0.0–40.0)

## 2013-04-09 LAB — HEMOGLOBIN A1C: HEMOGLOBIN A1C: 8.1 % — AB (ref 4.6–6.5)

## 2013-04-09 MED ORDER — INSULIN REGULAR HUMAN 100 UNIT/ML IJ SOLN: INTRAMUSCULAR | Status: DC

## 2013-04-09 NOTE — Assessment & Plan Note (Signed)
Check FLP 

## 2013-04-09 NOTE — Patient Instructions (Addendum)
I think a large portion of the issue of your leg swelling is from the weight. We will check blood work today, as well as leg ultrasound and heart ultrasound to help rule out other causes. We may also increase the lasix dose. In meantime, work on avoiding sodium in the diet and elevating the legs, continue ace wrap which will help. Return in next few months for diabetes follow up as you're due. EKG today.

## 2013-04-09 NOTE — Assessment & Plan Note (Signed)
Chronic, stable 

## 2013-04-09 NOTE — Assessment & Plan Note (Signed)
Pt will return within the month for DM f/u.  Refilled regular insulin.

## 2013-04-09 NOTE — Assessment & Plan Note (Signed)
20lb weight gain noted. Anticipate related to worsening lymphedema.

## 2013-04-09 NOTE — Progress Notes (Signed)
   Subjective:    Patient ID: Derek Bass, male    DOB: 1940/05/12, 73 y.o.   MRN: [id number]  HPI CC: leg swelling  Presents with daughter and ex who speak to me regarding their concerns with patient.  [name] presents today to discuss concerns with increasing edema noted. Notes increased swelling R>L over last few months. Trying to keep legs elevated. Avoiding salt and sodium in diet, trying to drink good water daily.  Noticing recently worsening dyspnea. Difficulty with walking up a flight of stairs 2/2 dyspnea.  No chest pain/tightness or palpitations. Endorses L>R paresthesias mainly on outer left ankle. Having some right calf and knee pain.  Now walking with cane.  Uses ammonium lactate and urea to bilateral feet.  Known chronic lymphedema R>L, recent BNP and Cr normal.  Last visit we added lasix to regimen which helped. H/o morbid obesity. 11/2012 when he was in [place] advised to go to urgent care for acute evaluation or right leg swelling - did not go.  Wt Readings from Last 3 Encounters:  04/09/13 475 lb (215.459 kg)  09/19/12 457 lb 8 oz (207.521 kg)  07/05/12 444 lb (201.397 kg)   Body mass index is 60.96 kg/(m^2).   Lab Results  Component Value Date   HGBA1C 7.2* 07/05/2012    Past Medical History  Diagnosis Date  . T2DM (type 2 diabetes mellitus)   . HTN (hypertension)   . HLD (hyperlipidemia)   . Colon polyps 04/2010    tubular adenomas, rpt colonoscopy 3 years [facility])  . Hypothyroid   . Hepatitis 1961    when in service, thinks viral  . Prostate cancer   . Vitamin D deficiency   . Obesity      Review of Systems Per HPI     Objective:   Physical Exam  Nursing note and vitals reviewed. Constitutional: He appears well-developed and well-nourished. No distress.  HENT:  Mouth/Throat: Oropharynx is clear and moist. No oropharyngeal exudate.  Eyes: Conjunctivae and EOM are normal. Pupils are equal, round, and reactive to light. No scleral  icterus.  Cardiovascular: Normal rate, normal heart sounds and intact distal pulses.  An irregular rhythm present.  Extrasystoles are present.  No murmur heard. Pulmonary/Chest: Effort normal and breath sounds normal. No respiratory distress. He has no wheezes. He has no rales.  Musculoskeletal: He exhibits edema (chronic lymphedema, marked swelling of R dorsal foot 1+ pitting).  L calf circ 62 cm R calf circ 68 cm Woody edema bilaterally R>L, some hyperpigmentation L>R.       Assessment & Plan:

## 2013-04-09 NOTE — Progress Notes (Signed)
Pre-visit discussion using our clinic review tool. No additional management support is needed unless otherwise documented below in the visit note.  

## 2013-04-09 NOTE — Assessment & Plan Note (Signed)
Recheck TSH today.  

## 2013-04-09 NOTE — Assessment & Plan Note (Addendum)
Recheck blood work including TSH and CMP. Anticipate worsening of chronic lymphedema R>L from recent weight gain. Continue lasix for now. Given endorsed worsening dyspnea, check 2D echo to eval for diastolic dysfunction/pulm htn in morbid obesity despite recently normal BNP. Given R>>L and mild erythema, check venous dopplers to r/o DVT as cause of this.

## 2013-04-10 ENCOUNTER — Telehealth: Payer: Self-pay | Admitting: Family Medicine

## 2013-04-10 ENCOUNTER — Telehealth: Payer: Self-pay

## 2013-04-10 NOTE — Telephone Encounter (Signed)
Relevant patient education assigned to patient using Emmi. ° °

## 2013-04-10 NOTE — Telephone Encounter (Signed)
Opened in error

## 2013-04-11 ENCOUNTER — Encounter: Payer: Self-pay | Admitting: *Deleted

## 2013-04-13 ENCOUNTER — Other Ambulatory Visit (INDEPENDENT_AMBULATORY_CARE_PROVIDER_SITE_OTHER): Payer: Medicare HMO

## 2013-04-13 ENCOUNTER — Other Ambulatory Visit: Payer: Self-pay

## 2013-04-13 DIAGNOSIS — R0989 Other specified symptoms and signs involving the circulatory and respiratory systems: Secondary | ICD-10-CM

## 2013-04-13 DIAGNOSIS — R0609 Other forms of dyspnea: Secondary | ICD-10-CM

## 2013-04-13 DIAGNOSIS — R0600 Dyspnea, unspecified: Secondary | ICD-10-CM

## 2013-04-13 DIAGNOSIS — R609 Edema, unspecified: Secondary | ICD-10-CM

## 2013-04-14 ENCOUNTER — Encounter: Payer: Self-pay | Admitting: Family Medicine

## 2013-04-16 ENCOUNTER — Encounter: Payer: Self-pay | Admitting: *Deleted

## 2013-04-17 ENCOUNTER — Encounter (INDEPENDENT_AMBULATORY_CARE_PROVIDER_SITE_OTHER): Payer: Medicare HMO

## 2013-04-17 DIAGNOSIS — M7989 Other specified soft tissue disorders: Secondary | ICD-10-CM

## 2013-04-17 DIAGNOSIS — R609 Edema, unspecified: Secondary | ICD-10-CM

## 2013-04-19 ENCOUNTER — Encounter: Payer: Self-pay | Admitting: *Deleted

## 2013-04-26 ENCOUNTER — Other Ambulatory Visit: Payer: Self-pay | Admitting: Family Medicine

## 2013-05-05 ENCOUNTER — Other Ambulatory Visit: Payer: Self-pay | Admitting: Family Medicine

## 2013-05-10 ENCOUNTER — Encounter: Payer: Self-pay | Admitting: Family Medicine

## 2013-05-10 ENCOUNTER — Ambulatory Visit (INDEPENDENT_AMBULATORY_CARE_PROVIDER_SITE_OTHER): Payer: Medicare HMO | Admitting: Family Medicine

## 2013-05-10 VITALS — BP 130/80 | HR 88 | Temp 98.0°F | Wt >= 6400 oz

## 2013-05-10 DIAGNOSIS — E881 Lipodystrophy, not elsewhere classified: Secondary | ICD-10-CM

## 2013-05-10 DIAGNOSIS — I890 Lymphedema, not elsewhere classified: Secondary | ICD-10-CM

## 2013-05-10 DIAGNOSIS — Z23 Encounter for immunization: Secondary | ICD-10-CM

## 2013-05-10 DIAGNOSIS — E119 Type 2 diabetes mellitus without complications: Secondary | ICD-10-CM

## 2013-05-10 HISTORY — DX: Lymphedema, not elsewhere classified: I89.0

## 2013-05-10 MED ORDER — INSULIN DETEMIR 100 UNIT/ML FLEXPEN: PEN_INJECTOR | SUBCUTANEOUS | Status: DC

## 2013-05-10 MED ORDER — CLOTRIMAZOLE 1 % EX CREA: 1.0000 "application " | TOPICAL_CREAM | Freq: Two times a day (BID) | CUTANEOUS | Status: DC

## 2013-05-10 MED ORDER — INSULIN REGULAR HUMAN 100 UNIT/ML IJ SOLN: INTRAMUSCULAR | Status: DC

## 2013-05-10 NOTE — Progress Notes (Signed)
BP 130/80  Pulse 88  Temp(Src) 98 F (36.7 C) (Oral)  Wt 477 lb 4 oz (216.479 kg)   CC: 1 mo f/u visit  Subjective:    Patient ID: Derek Bass, male    DOB: November 28, 1940, 73 y.o.   MRN: [id number]  HPI: Derek Bass is a 73 y.o. male presenting on 05/10/2013 with Follow-up  Recent worsening peripheral edema thought due to lymphedema - normal workup including 2D echo (grade 1 diastolic dysfunction with mild LA dilation) and negative RLE venous doppler.  Has been using ace bandage wrap which helps with legs.  Would be interested in lymphedema pump.  DM - regularly does check sugars 1-2x/day: did not bring log but states fasting 200s.  Compliant with antihyperglycemic regimen which includes: levemir 45 u in am and metformin 1000mg  bid and regular insulin but had only been taking this once a day - with levemir fasting in am.  Was not taking with food.  Eats breakfast at 10am regularly, other meals are not as regular.  Denies low sugars or hypoglycemic symptoms.  Occasional paresthesias. Last diabetic eye exam in [place] 1+ year ago.  Pneumovax: none recently.  Wt Readings from Last 3 Encounters:  05/10/13 477 lb 4 oz (216.479 kg)  04/09/13 475 lb (215.459 kg)  09/19/12 457 lb 8 oz (207.521 kg)  continued weight gain noted. Body mass index is 61.25 kg/(m^2). Trying to eat more vegetables.  Relevant past medical, surgical, family and social history reviewed and updated. Allergies and medications reviewed and updated. Current Outpatient Prescriptions on File Prior to Visit  Medication Sig  . acetaminophen (TYLENOL) 650 MG CR tablet Take 650 mg by mouth every 8 (eight) hours as needed.  Marland Kitchen ammonium lactate (AMLACTIN) 12 % cream APPLY TOPICALLY AS NEEDED.  Marland Kitchen aspirin 325 MG tablet Take 325 mg by mouth daily.  . calcium-vitamin D (OSCAL WITH D) 500-200 MG-UNIT per tablet Take 1 tablet by mouth daily.  . furosemide (LASIX) 20 MG tablet TAKE 1 TABLET BY MOUTH EVERY DAY AS NEEDED FOR  LEG SWELLING  . levothyroxine (SYNTHROID, LEVOTHROID) 200 MCG tablet TAKE 1 TABLET (200 MCG TOTAL) BY MOUTH DAILY.  Marland Kitchen loperamide (IMODIUM A-D) 2 MG tablet Take 2 mg by mouth as needed.  . metFORMIN (GLUCOPHAGE) 1000 MG tablet TAKE 1 TABLET BY MOUTH TWICE A DAY WITH MEALS  . metoprolol (LOPRESSOR) 100 MG tablet TAKE 1 TABLET (100 MG TOTAL) BY MOUTH 2 (TWO) TIMES DAILY.  . Multiple Vitamin (MULTIVITAMIN) capsule Take 1 capsule by mouth daily.  . quinapril (ACCUPRIL) 20 MG tablet TAKE 1 TABLET BY MOUTH TWICE DAILY  . simvastatin (ZOCOR) 20 MG tablet TAKE 1 TABLET BY MOUTH AT BEDTIME  . spironolactone (ALDACTONE) 25 MG tablet TAKE 1 TABLET BY MOUTH 2 TIMES A DAY  . tamsulosin (FLOMAX) 0.4 MG CAPS capsule TAKE 1 CAPSULE (0.4 MG TOTAL) BY MOUTH DAILY.  . urea (CARMOL) 40 % CREA apply to bottoms of heels every day as needed for cracking heels.   No current facility-administered medications on file prior to visit.    Review of Systems Per HPI unless specifically indicated above    Objective:    BP 130/80  Pulse 88  Temp(Src) 98 F (36.7 C) (Oral)  Wt 477 lb 4 oz (216.479 kg)  Physical Exam  Nursing note and vitals reviewed. Constitutional: He appears well-developed and well-nourished. No distress.  Morbidly obese  HENT:  Mouth/Throat: Oropharynx is clear and moist. No oropharyngeal exudate.  Eyes:  Conjunctivae and EOM are normal. Pupils are equal, round, and reactive to light. No scleral icterus.  Cardiovascular: Normal rate, regular rhythm, normal heart sounds and intact distal pulses.   No murmur heard. Pulmonary/Chest: Effort normal and breath sounds normal. No respiratory distress. He has no wheezes. He has no rales.  Musculoskeletal: He exhibits edema (chronic lymphedema, marked swelling of R dorsal foot 1+ pitting).  Chronic lymphedema changes BLE. Maceration between toes bilaterally Thickened nails and skin on soles Decreased DP likely 2/2 edema Woody edema bilaterally R>L,  some hyperpigmentation L>R.   Results for orders placed in visit on 04/09/13  TSH      Result Value Ref Range   TSH 4.12  0.35 - 5.50 uIU/mL  COMPREHENSIVE METABOLIC PANEL      Result Value Ref Range   Sodium 141  135 - 145 mEq/L   Potassium 4.0  3.5 - 5.1 mEq/L   Chloride 103  96 - 112 mEq/L   CO2 30  19 - 32 mEq/L   Glucose, Bld 111 (*) 70 - 99 mg/dL   BUN 13  6 - 23 mg/dL   Creatinine, Ser 1.0  0.4 - 1.5 mg/dL   Total Bilirubin 0.7  0.3 - 1.2 mg/dL   Alkaline Phosphatase 66  39 - 117 U/L   AST 26  0 - 37 U/L   ALT 39  0 - 53 U/L   Total Protein 7.9  6.0 - 8.3 g/dL   Albumin 4.1  3.5 - 5.2 g/dL   Calcium 9.7  8.4 - 10.5 mg/dL   GFR 99.06  >60.00 mL/min  HEMOGLOBIN A1C      Result Value Ref Range   Hemoglobin A1C 8.1 (*) 4.6 - 6.5 %  LIPID PANEL      Result Value Ref Range   Cholesterol 194  0 - 200 mg/dL   Triglycerides 95.0  0.0 - 149.0 mg/dL   HDL 62.40  >39.00 mg/dL   VLDL 19.0  0.0 - 40.0 mg/dL   LDL Cholesterol 113 (*) 0 - 99 mg/dL   Total CHOL/HDL Ratio 3        Assessment & Plan:   Problem List Items Addressed This Visit   DIABETES MELLITUS, TYPE II, CONTROLLED - Primary     Encouraged he schedule eye exam as due. prevnar and flu shots today. Foot exam today. Discussed correct use of insulin - will start at levemir 45 u in am as well as regular insulin 10 u 15 min prior to 2 largest meals then slow titration up. rtc 2-3 mo for DM f/u. Regular insulin refilled today.  Clotrimazole sent in for early tinea pedis.    Relevant Medications      Insulin Detemir (LEVEMIR FLEXTOUCH) 100 UNIT/ML Pen      insulin regular (NOVOLIN R,HUMULIN R) 100 units/mL injection   Other Relevant Orders      HM DIABETES FOOT EXAM (Completed)   LIPODYSTROPHY   Lymphedema     Continue elevation of legs as well as provided with script for lymphedema pump for chronic lymphedema.  If unable to order, will refer to [facility].    Morbid obesity     2 lb weight gain noted.           Follow up plan: Return in about 3 months (around 08/07/2013), or as needed, for follow up diabetes.

## 2013-05-10 NOTE — Assessment & Plan Note (Addendum)
Encouraged he schedule eye exam as due. prevnar and flu shots today. Foot exam today. Discussed correct use of insulin - will start at levemir 45 u in am as well as regular insulin 10 u 15 min prior to 2 largest meals then slow titration up. rtc 2-3 mo for DM f/u. Regular insulin refilled today.  Clotrimazole sent in for early tinea pedis.

## 2013-05-10 NOTE — Assessment & Plan Note (Signed)
2 lb weight gain noted

## 2013-05-10 NOTE — Addendum Note (Signed)
Addended by: Royann Shivers A on: 05/10/2013 01:28 PM   Modules accepted: Orders

## 2013-05-10 NOTE — Progress Notes (Signed)
Pre-visit discussion using our clinic review tool. No additional management support is needed unless otherwise documented below in the visit note.  

## 2013-05-10 NOTE — Assessment & Plan Note (Signed)
Continue elevation of legs as well as provided with script for lymphedema pump for chronic lymphedema.  If unable to order, will refer to [facility].

## 2013-05-10 NOTE — Patient Instructions (Addendum)
Pneumonia (prevnar) and flu shots today. Prescription for lymphedema pump provided today.  Take this to a durable medical equipment store.  You have some skin breakdown between the toes - try to keep interdigital spaces dry and use lotrimin over the counter between toes. Continue levemir 45 units in the morning daily. Use novolin R (regular insulin) 10 units with meal at 10am and then again 10 units with next largest meal.  May slowly increase insulin by a few units every 3 days up to 20 units with meals twice a day if sugars 2 hours after meal are consistently >200. Continue metformin 1000mg  twice daily. Call to schedule eye exam as you're due.

## 2013-07-02 ENCOUNTER — Other Ambulatory Visit: Payer: Self-pay | Admitting: Family Medicine

## 2013-07-02 NOTE — Telephone Encounter (Signed)
Received refill request electronically. See drug warning regarding Spironolactone and Quinapril. Is it okay to refill medication?

## 2013-08-07 ENCOUNTER — Encounter: Payer: Self-pay | Admitting: Family Medicine

## 2013-08-07 ENCOUNTER — Ambulatory Visit (INDEPENDENT_AMBULATORY_CARE_PROVIDER_SITE_OTHER): Payer: Commercial Managed Care - HMO | Admitting: Family Medicine

## 2013-08-07 VITALS — BP 126/82 | HR 88 | Temp 97.7°F | Wt >= 6400 oz

## 2013-08-07 DIAGNOSIS — I890 Lymphedema, not elsewhere classified: Secondary | ICD-10-CM

## 2013-08-07 DIAGNOSIS — I10 Essential (primary) hypertension: Secondary | ICD-10-CM

## 2013-08-07 DIAGNOSIS — E119 Type 2 diabetes mellitus without complications: Secondary | ICD-10-CM

## 2013-08-07 LAB — BASIC METABOLIC PANEL
BUN: 15 mg/dL (ref 6–23)
CALCIUM: 9.7 mg/dL (ref 8.4–10.5)
CHLORIDE: 100 meq/L (ref 96–112)
CO2: 30 meq/L (ref 19–32)
Creatinine, Ser: 1 mg/dL (ref 0.4–1.5)
GFR: 97.79 mL/min (ref >60.00)
GLUCOSE: 190 mg/dL — AB (ref 70–99)
Potassium: 4.3 mEq/L (ref 3.5–5.1)
SODIUM: 138 meq/L (ref 135–145)

## 2013-08-07 LAB — HEMOGLOBIN A1C: Hgb A1c MFr Bld: 8.5 % — ABNORMAL HIGH (ref 4.6–6.5)

## 2013-08-07 NOTE — Assessment & Plan Note (Signed)
20lb weight loss noted - attributed to fluid weight after starting lasix.  Check Cr today.

## 2013-08-07 NOTE — Patient Instructions (Addendum)
Schedule eye exam as you're due. Blood work today to check A1c. Bring log of sugars to next visit to review. Return to see me in 4 months for physical, prior fasting for blood work.Marland Kitchen

## 2013-08-07 NOTE — Assessment & Plan Note (Signed)
Encouraged he schedule eye exam as he's due. Improved sugar control with more appropriate use of insulin. Will check A1c today. rtc 4 mo for wellness exam.

## 2013-08-07 NOTE — Assessment & Plan Note (Signed)
Chronic, stable. Continue meds. 

## 2013-08-07 NOTE — Progress Notes (Signed)
BP 126/82  Pulse 88  Temp(Src) 97.7 F (36.5 C) (Oral)  Wt 457 lb 4 oz (207.407 kg)   CC: 3 mo f/u  Subjective:    Patient ID: Derek Bass, male    DOB: [birth year], 73 y.o.   MRN: [id number]  HPI: Derek Bass is a 73 y.o. male presenting on 08/07/2013 for Follow-up   Recent worsening peripheral edema thought due to lymphedema - normal workup including 2D echo (grade 1 diastolic dysfunction with mild LA dilation) and negative RLE venous doppler. Has been using ace bandage wrap which helps with legs.  Legs some improved.  20lb weight loss noticed.  DM - regularly does check sugars 1-2x/day: did not bring log but states fasting sugar this morning was 157, still some 200s. Compliant with antihyperglycemic regimen which includes: levemir 45 u in am and metformin 1000mg  bid and regular insulin (humalog R with meals).  Lowest sugar was 98.  Eats breakfast at 10am regularly, other meals are not as regular. Denies low sugars , but does have some hypoglycemic symptoms with sugars in 90s-100s. Occasional paresthesias. Last diabetic eye exam: [facility]. Pneumovax: 05/2009.  Prevnar: 04/2013.   Urine color change - Urine is darker than normal.  No dysuria.  Declines UA today.  Agrees to increase water intake.  Wt Readings from Last 3 Encounters:  08/07/13 457 lb 4 oz (207.407 kg)  05/10/13 477 lb 4 oz (216.479 kg)  04/09/13 475 lb (215.459 kg)  Body mass index is 58.68 kg/(m^2).  Relevant past medical, surgical, family and social history reviewed and updated as indicated.  Allergies and medications reviewed and updated. Current Outpatient Prescriptions on File Prior to Visit  Medication Sig  . acetaminophen (TYLENOL) 650 MG CR tablet Take 650 mg by mouth every 8 (eight) hours as needed.  Marland Kitchen ammonium lactate (AMLACTIN) 12 % cream APPLY TOPICALLY AS NEEDED.  Marland Kitchen aspirin 325 MG tablet Take 325 mg by mouth daily.  . calcium-vitamin D (OSCAL WITH D) 500-200 MG-UNIT per tablet Take 1 tablet by  mouth daily.  . clotrimazole (LOTRIMIN AF) 1 % cream Apply 1 application topically 2 (two) times daily. Interdigital web spaces  . furosemide (LASIX) 20 MG tablet TAKE 1 TABLET BY MOUTH EVERY DAY AS NEEDED FOR LEG SWELLING  . Insulin Detemir (LEVEMIR FLEXTOUCH) 100 UNIT/ML Pen INJECT 45 UNITS INTO THE SKIN in morning.  Qs 3 months  . insulin regular (NOVOLIN R,HUMULIN R) 100 units/mL injection Inject 10-20 units twice daily 15 min before 2 largest meals  . levothyroxine (SYNTHROID, LEVOTHROID) 200 MCG tablet TAKE 1 TABLET (200 MCG TOTAL) BY MOUTH DAILY.  Marland Kitchen loperamide (IMODIUM A-D) 2 MG tablet Take 2 mg by mouth as needed.  . metFORMIN (GLUCOPHAGE) 1000 MG tablet TAKE 1 TABLET BY MOUTH TWICE A DAY WITH MEALS  . metoprolol (LOPRESSOR) 100 MG tablet TAKE 1 TABLET (100 MG TOTAL) BY MOUTH 2 (TWO) TIMES DAILY.  . Multiple Vitamin (MULTIVITAMIN) capsule Take 1 capsule by mouth daily.  . quinapril (ACCUPRIL) 20 MG tablet TAKE 1 TABLET BY MOUTH TWICE DAILY  . simvastatin (ZOCOR) 20 MG tablet TAKE 1 TABLET BY MOUTH AT BEDTIME  . spironolactone (ALDACTONE) 25 MG tablet TAKE 1 TABLET BY MOUTH 2 TIMES A DAY  . tamsulosin (FLOMAX) 0.4 MG CAPS capsule TAKE ONE CAPSULE BY MOUTH EVERY DAY  . urea (CARMOL) 40 % CREA apply to bottoms of heels every day as needed for cracking heels.   No current facility-administered medications on file prior  to visit.    Review of Systems Per HPI unless specifically indicated above    Objective:    BP 126/82  Pulse 88  Temp(Src) 97.7 F (36.5 C) (Oral)  Wt 457 lb 4 oz (207.407 kg)  Physical Exam  Nursing note and vitals reviewed. Constitutional: He appears well-developed and well-nourished. No distress.  obese  HENT:  Mouth/Throat: Oropharynx is clear and moist. No oropharyngeal exudate.  Eyes: Conjunctivae and EOM are normal. Pupils are equal, round, and reactive to light. No scleral icterus.  Cardiovascular: Normal rate, regular rhythm, normal heart sounds and  intact distal pulses.   No murmur heard. Pulmonary/Chest: Effort normal and breath sounds normal. No respiratory distress. He has no wheezes. He has no rales.  Musculoskeletal: He exhibits edema (chronic indurated swelling R>L).       Assessment & Plan:   Problem List Items Addressed This Visit   Diabetes type 2, uncontrolled - Primary     Encouraged he schedule eye exam as he's due. Improved sugar control with more appropriate use of insulin. Will check A1c today. rtc 4 mo for wellness exam.    HTN (hypertension)     Chronic, stable. Continue meds.    Morbid obesity     20lb weight loss noted - attributed to fluid weight after starting lasix.  Check Cr today.    Lymphedema     Improved with weight loss but persistent chronic lymphedema changes R>L.  Did not fill lymphedema pump.        Follow up plan: Return in about 4 months (around 12/08/2013), or as needed, for annual exam, prior fasting for blood work.

## 2013-08-07 NOTE — Progress Notes (Signed)
Pre visit review using our clinic review tool, if applicable. No additional management support is needed unless otherwise documented below in the visit note. 

## 2013-08-07 NOTE — Assessment & Plan Note (Signed)
Improved with weight loss but persistent chronic lymphedema changes R>L.  Did not fill lymphedema pump.

## 2013-08-08 ENCOUNTER — Other Ambulatory Visit: Payer: Self-pay | Admitting: Family Medicine

## 2013-08-08 MED ORDER — INSULIN REGULAR HUMAN 100 UNIT/ML IJ SOLN: INTRAMUSCULAR | Status: DC

## 2013-08-08 MED ORDER — INSULIN DETEMIR 100 UNIT/ML FLEXPEN: 50.0000 [IU] | PEN_INJECTOR | SUBCUTANEOUS | Status: DC

## 2013-08-18 ENCOUNTER — Other Ambulatory Visit: Payer: Self-pay | Admitting: Family Medicine

## 2013-08-24 ENCOUNTER — Telehealth: Payer: Self-pay

## 2013-08-24 DIAGNOSIS — I890 Lymphedema, not elsewhere classified: Secondary | ICD-10-CM

## 2013-08-24 DIAGNOSIS — R609 Edema, unspecified: Secondary | ICD-10-CM

## 2013-08-24 NOTE — Telephone Encounter (Signed)
Unfortunately I don't think ortho will help patient with lymphedema - I would suggest referral to vascular doctors for evaluation and to discuss possible lymphedema pump or other options.   If pt agrees, I have placed referral.

## 2013-08-24 NOTE — Telephone Encounter (Signed)
Pt was seen 08/07/13, pt has lympedema; pt noticed one week ago lt lower leg in calf oozing of clear sticky drainage (drainage continuing now). Both legs appear darker than usual and rt leg more swollen than left leg; pt request referral to [facility] orthopedics [phone]. Pt request cb.

## 2013-08-24 NOTE — Telephone Encounter (Signed)
Spoke with pt.  Agrees to vascular referral.

## 2013-09-03 ENCOUNTER — Other Ambulatory Visit: Payer: Self-pay

## 2013-09-03 MED ORDER — CLOTRIMAZOLE 1 % EX CREA: 1.0000 "application " | TOPICAL_CREAM | Freq: Two times a day (BID) | CUTANEOUS | Status: DC

## 2013-09-03 NOTE — Telephone Encounter (Signed)
Pt left v/m requesting refill clotrimazole 1 % cream to [facility].pt request cb when refilled.

## 2013-09-03 NOTE — Telephone Encounter (Signed)
Sent in.plz notify pt.  

## 2013-09-04 NOTE — Telephone Encounter (Signed)
Rx called in as directed.   

## 2013-10-11 ENCOUNTER — Other Ambulatory Visit: Payer: Self-pay | Admitting: Family Medicine

## 2013-10-20 ENCOUNTER — Encounter: Payer: Self-pay | Admitting: Family Medicine

## 2013-12-10 ENCOUNTER — Other Ambulatory Visit: Payer: Self-pay | Admitting: Family Medicine

## 2013-12-10 DIAGNOSIS — E1165 Type 2 diabetes mellitus with hyperglycemia: Secondary | ICD-10-CM

## 2013-12-10 DIAGNOSIS — I10 Essential (primary) hypertension: Secondary | ICD-10-CM

## 2013-12-10 DIAGNOSIS — E785 Hyperlipidemia, unspecified: Secondary | ICD-10-CM

## 2013-12-10 DIAGNOSIS — IMO0002 Reserved for concepts with insufficient information to code with codable children: Secondary | ICD-10-CM

## 2013-12-10 DIAGNOSIS — Z125 Encounter for screening for malignant neoplasm of prostate: Secondary | ICD-10-CM

## 2013-12-10 DIAGNOSIS — E039 Hypothyroidism, unspecified: Secondary | ICD-10-CM

## 2013-12-13 ENCOUNTER — Other Ambulatory Visit (INDEPENDENT_AMBULATORY_CARE_PROVIDER_SITE_OTHER): Payer: Commercial Managed Care - HMO

## 2013-12-13 DIAGNOSIS — Z125 Encounter for screening for malignant neoplasm of prostate: Secondary | ICD-10-CM

## 2013-12-13 DIAGNOSIS — E1165 Type 2 diabetes mellitus with hyperglycemia: Secondary | ICD-10-CM

## 2013-12-13 DIAGNOSIS — E039 Hypothyroidism, unspecified: Secondary | ICD-10-CM

## 2013-12-13 DIAGNOSIS — IMO0002 Reserved for concepts with insufficient information to code with codable children: Secondary | ICD-10-CM

## 2013-12-13 DIAGNOSIS — I10 Essential (primary) hypertension: Secondary | ICD-10-CM

## 2013-12-13 DIAGNOSIS — IMO0001 Reserved for inherently not codable concepts without codable children: Secondary | ICD-10-CM

## 2013-12-13 DIAGNOSIS — E785 Hyperlipidemia, unspecified: Secondary | ICD-10-CM

## 2013-12-13 LAB — LIPID PANEL
Cholesterol: 208 mg/dL — ABNORMAL HIGH (ref 0–200)
HDL: 53 mg/dL (ref >39.00)
LDL Cholesterol: 124 mg/dL — ABNORMAL HIGH (ref 0–99)
NONHDL: 155
TRIGLYCERIDES: 157 mg/dL — AB (ref 0.0–149.0)
Total CHOL/HDL Ratio: 4
VLDL: 31.4 mg/dL (ref 0.0–40.0)

## 2013-12-13 LAB — PSA: PSA: 1.32 ng/mL (ref 0.10–4.00)

## 2013-12-13 LAB — TSH: TSH: 3.59 u[IU]/mL (ref 0.35–4.50)

## 2013-12-13 LAB — BASIC METABOLIC PANEL
BUN: 14 mg/dL (ref 6–23)
CALCIUM: 9.6 mg/dL (ref 8.4–10.5)
CHLORIDE: 99 meq/L (ref 96–112)
CO2: 32 meq/L (ref 19–32)
Creatinine, Ser: 1 mg/dL (ref 0.4–1.5)
GFR: 96.54 mL/min (ref >60.00)
GLUCOSE: 237 mg/dL — AB (ref 70–99)
Potassium: 4.1 mEq/L (ref 3.5–5.1)
SODIUM: 137 meq/L (ref 135–145)

## 2013-12-13 LAB — HEMOGLOBIN A1C: Hgb A1c MFr Bld: 8.7 % — ABNORMAL HIGH (ref 4.6–6.5)

## 2013-12-17 ENCOUNTER — Encounter: Payer: Self-pay | Admitting: Family Medicine

## 2013-12-27 ENCOUNTER — Encounter: Payer: Self-pay | Admitting: Family Medicine

## 2014-01-08 ENCOUNTER — Ambulatory Visit: Payer: Self-pay

## 2014-01-08 ENCOUNTER — Encounter: Payer: Self-pay | Admitting: Family Medicine

## 2014-01-08 ENCOUNTER — Telehealth: Payer: Self-pay | Admitting: Family Medicine

## 2014-01-08 ENCOUNTER — Ambulatory Visit (INDEPENDENT_AMBULATORY_CARE_PROVIDER_SITE_OTHER): Payer: Commercial Managed Care - HMO | Admitting: Family Medicine

## 2014-01-08 VITALS — BP 138/88 | HR 72 | Temp 98.2°F | Ht 74.0 in | Wt >= 6400 oz

## 2014-01-08 DIAGNOSIS — E039 Hypothyroidism, unspecified: Secondary | ICD-10-CM

## 2014-01-08 DIAGNOSIS — E785 Hyperlipidemia, unspecified: Secondary | ICD-10-CM

## 2014-01-08 DIAGNOSIS — Z0000 Encounter for general adult medical examination without abnormal findings: Secondary | ICD-10-CM

## 2014-01-08 DIAGNOSIS — E1165 Type 2 diabetes mellitus with hyperglycemia: Secondary | ICD-10-CM

## 2014-01-08 DIAGNOSIS — I10 Essential (primary) hypertension: Secondary | ICD-10-CM

## 2014-01-08 DIAGNOSIS — Z7189 Other specified counseling: Secondary | ICD-10-CM

## 2014-01-08 DIAGNOSIS — M545 Low back pain, unspecified: Secondary | ICD-10-CM | POA: Insufficient documentation

## 2014-01-08 DIAGNOSIS — IMO0002 Reserved for concepts with insufficient information to code with codable children: Secondary | ICD-10-CM

## 2014-01-08 MED ORDER — ATORVASTATIN CALCIUM 40 MG PO TABS: 40.0000 mg | ORAL_TABLET | Freq: Every day | ORAL | Status: DC

## 2014-01-08 NOTE — Assessment & Plan Note (Signed)
Encouraged he schedule eye exam as due. Elevated sugars recently - discussed slow titration of levemir to goal 60 u daily if tolerated. See instructions RTC 3 mo DM f/u visit

## 2014-01-08 NOTE — Patient Instructions (Addendum)
Vision screen today. Flu shot today Make sure you aren't getting regular sugar in your [employer] coffee. Advanced directive packet provided. Change simvastatin to lipitor 40mg  at bedtime - stronger cholesterol medicine. Increase levemir by 1 unit every 3 days if average sugar over 3 days >150. Max levemir will be 60 units. Xray of lower back today. Pass by Linda's office to schedule xray of lower back. Return in 3 months for diabetes follow up  Health Maintenance A healthy lifestyle and preventative care can promote health and wellness.  Maintain regular health, dental, and eye exams.  Eat a healthy diet. Foods like vegetables, fruits, whole grains, low-fat dairy products, and lean protein foods contain the nutrients you need and are low in calories. Decrease your intake of foods high in solid fats, added sugars, and salt. Get information about a proper diet from your health care provider, if necessary.  Regular physical exercise is one of the most important things you can do for your health. Most adults should get at least 150 minutes of moderate-intensity exercise (any activity that increases your heart rate and causes you to sweat) each week. In addition, most adults need muscle-strengthening exercises on 2 or more days a week.   Maintain a healthy weight. The body mass index (BMI) is a screening tool to identify possible weight problems. It provides an estimate of body fat based on height and weight. Your health care provider can find your BMI and can help you achieve or maintain a healthy weight. For males 20 years and older:  A BMI below 18.5 is considered underweight.  A BMI of 18.5 to 24.9 is normal.  A BMI of 25 to 29.9 is considered overweight.  A BMI of 30 and above is considered obese.  Maintain normal blood lipids and cholesterol by exercising and minimizing your intake of saturated fat. Eat a balanced diet with plenty of fruits and vegetables. Blood tests for lipids and  cholesterol should begin at age 67 and be repeated every 5 years. If your lipid or cholesterol levels are high, you are over age 70, or you are at high risk for heart disease, you may need your cholesterol levels checked more frequently.Ongoing high lipid and cholesterol levels should be treated with medicines if diet and exercise are not working.  If you smoke, find out from your health care provider how to quit. If you do not use tobacco, do not start.  Lung cancer screening is recommended for adults aged 73-80 years who are at high risk for developing lung cancer because of a history of smoking. A yearly low-dose CT scan of the lungs is recommended for people who have at least a 30-pack-year history of smoking and are current smokers or have quit within the past 15 years. A pack year of smoking is smoking an average of 1 pack of cigarettes a day for 1 year (for example, a 30-pack-year history of smoking could mean smoking 1 pack a day for 30 years or 2 packs a day for 15 years). Yearly screening should continue until the smoker has stopped smoking for at least 15 years. Yearly screening should be stopped for people who develop a health problem that would prevent them from having lung cancer treatment.  If you choose to drink alcohol, do not have more than 2 drinks per day. One drink is considered to be 12 oz (360 mL) of beer, 5 oz (150 mL) of wine, or 1.5 oz (45 mL) of liquor.  Avoid the use  of street drugs. Do not share needles with anyone. Ask for help if you need support or instructions about stopping the use of drugs.  High blood pressure causes heart disease and increases the risk of stroke. Blood pressure should be checked at least every 1-2 years. Ongoing high blood pressure should be treated with medicines if weight loss and exercise are not effective.  If you are 29-59 years old, ask your health care provider if you should take aspirin to prevent heart disease.  Diabetes screening involves  taking a blood sample to check your fasting blood sugar level. This should be done once every 3 years after age 53 if you are at a normal weight and without risk factors for diabetes. Testing should be considered at a younger age or be carried out more frequently if you are overweight and have at least 1 risk factor for diabetes.  Colorectal cancer can be detected and often prevented. Most routine colorectal cancer screening begins at the age of 100 and continues through age 11. However, your health care provider may recommend screening at an earlier age if you have risk factors for colon cancer. On a yearly basis, your health care provider may provide home test kits to check for hidden blood in the stool. A small camera at the end of a tube may be used to directly examine the colon (sigmoidoscopy or colonoscopy) to detect the earliest forms of colorectal cancer. Talk to your health care provider about this at age 43 when routine screening begins. A direct exam of the colon should be repeated every 5-10 years through age 104, unless early forms of precancerous polyps or small growths are found.  People who are at an increased risk for hepatitis B should be screened for this virus. You are considered at high risk for hepatitis B if:  You were born in a country where hepatitis B occurs often. Talk with your health care provider about which countries are considered high risk.  Your parents were born in a high-risk country and you have not received a shot to protect against hepatitis B (hepatitis B vaccine).  You have HIV or AIDS.  You use needles to inject street drugs.  You live with, or have sex with, someone who has hepatitis B.  You are a man who has sex with other men (MSM).  You get hemodialysis treatment.  You take certain medicines for conditions like cancer, organ transplantation, and autoimmune conditions.  Hepatitis C blood testing is recommended for all people born from [birth year] through 1964  and any individual with known risk factors for hepatitis C.  Healthy men should no longer receive prostate-specific antigen (PSA) blood tests as part of routine cancer screening. Talk to your health care provider about prostate cancer screening.  Testicular cancer screening is not recommended for adolescents or adult males who have no symptoms. Screening includes self-exam, a health care provider exam, and other screening tests. Consult with your health care provider about any symptoms you have or any concerns you have about testicular cancer.  Practice safe sex. Use condoms and avoid high-risk sexual practices to reduce the spread of sexually transmitted infections (STIs).  You should be screened for STIs, including gonorrhea and chlamydia if:  You are sexually active and are younger than 24 years.  You are older than 24 years, and your health care provider tells you that you are at risk for this type of infection.  Your sexual activity has changed since you were last screened,  and you are at an increased risk for chlamydia or gonorrhea. Ask your health care provider if you are at risk.  If you are at risk of being infected with HIV, it is recommended that you take a prescription medicine daily to prevent HIV infection. This is called pre-exposure prophylaxis (PrEP). You are considered at risk if:  You are a man who has sex with other men (MSM).  You are a heterosexual man who is sexually active with multiple partners.  You take drugs by injection.  You are sexually active with a partner who has HIV.  Talk with your health care provider about whether you are at high risk of being infected with HIV. If you choose to begin PrEP, you should first be tested for HIV. You should then be tested every 3 months for as long as you are taking PrEP.  Use sunscreen. Apply sunscreen liberally and repeatedly throughout the day. You should seek shade when your shadow is shorter than you. Protect  yourself by wearing long sleeves, pants, a wide-brimmed hat, and sunglasses year round whenever you are outdoors.  Tell your health care provider of new moles or changes in moles, especially if there is a change in shape or color. Also, tell your health care provider if a mole is larger than the size of a pencil eraser.  A one-time screening for abdominal aortic aneurysm (AAA) and surgical repair of large AAAs by ultrasound is recommended for men aged 27-75 years who are current or former smokers.  Stay current with your vaccines (immunizations). Document Released: 09/11/2007 Document Revised: 03/20/2013 Document Reviewed: 08/10/2010 [facility] Patient Information 2015 [place], [place]. This information is not intended to replace advice given to you by your health care provider. Make sure you discuss any questions you have with your health care provider.

## 2014-01-08 NOTE — Telephone Encounter (Signed)
Patient received your text message and asked for you to call him back.

## 2014-01-08 NOTE — Telephone Encounter (Signed)
Spoke with patient.

## 2014-01-08 NOTE — Assessment & Plan Note (Signed)
Longstanding issue. Check baseline lumbar films, continue tylenol for pain.

## 2014-01-08 NOTE — Assessment & Plan Note (Signed)

## 2014-01-08 NOTE — Assessment & Plan Note (Signed)
LDL not at goal - change simvastatin to lipitor 40mg  daily.

## 2014-01-08 NOTE — Assessment & Plan Note (Signed)
Advanced directives - requests info on this. Has not thought about this.

## 2014-01-08 NOTE — Progress Notes (Signed)
Pre visit review using our clinic review tool, if applicable. No additional management support is needed unless otherwise documented below in the visit note. 

## 2014-01-08 NOTE — Assessment & Plan Note (Signed)
TSH stable.  

## 2014-01-08 NOTE — Assessment & Plan Note (Signed)
Chronic, stable. Continue regimen. 

## 2014-01-08 NOTE — Progress Notes (Signed)
BP 138/88  Pulse 72  Temp(Src) 98.2 F (36.8 C) (Oral)  Ht 6\' 2"  (1.88 m)  Wt 462 lb 4 oz (209.675 kg)  BMI 59.32 kg/m2   CC: medicare wellness visit  Subjective:    Patient ID: Derek Bass, male    DOB: April 25, 1940, 73 y.o.   MRN: [id number]  HPI: Derek Bass is a 73 y.o. male presenting on 01/08/2014 for Annual Exam   Wt Readings from Last 3 Encounters:  01/08/14 462 lb 4 oz (209.675 kg)  08/07/13 457 lb 4 oz (207.407 kg)  05/10/13 477 lb 4 oz (216.479 kg)   Body mass index is 59.32 kg/(m^2).  Back pain - started several years ago. Worsening. Walks with cane. Sharp midline pain at top of buttock when he's walking. No shooting pain down legs, numbness, weakness. No fevers.   DM - elevated readings recently fasting 200-300s. Compliant with levemir 50u daily. Has increased parfait at [employer]. Not in last week.   [facility] - uses lymphedema pump and graduated compression stockings per [facility] (Schnier). Satisfied with effect.  Denies depression, anhedonia, falls. Vision screen - done today Hearing screen - passed today.   Preventative: H/o polyps in colon, last colonoscopy was 2012 in [place], Dr. Benson Norway. Told looking ok rec rpt 3 or 10yrs put unsure.  History of prostate cancer tx seed implants in [place] now followed by Dr. Gaynelle Arabian with yearly PSAs. Did see Dr. Gaynelle Arabian 2-3 mo ago. Requests PSA drawn here from here on out but will continue to follow with urology. Flu - today Pneumovax 05/2009, prevnar 04/2013 Td 11/2011 zostavax - 2011. Advanced directives - requests info on this. Has not thought about this.  Caffeine: 1 cup/day coffee, soda/lemonade with food  Lives alone. Family in area.  Edu: HS  Retired, previously Scientist, physiological.  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  2 biological children, 4 step children  Activity: no regular exercise  Diet: seldom water, fruits/vegetables seldom    Relevant past medical, surgical, family and social history reviewed and updated as indicated.  Allergies and medications reviewed and updated. Current Outpatient Prescriptions on File Prior to Visit  Medication Sig  . acetaminophen (TYLENOL) 650 MG CR tablet Take 650 mg by mouth every 8 (eight) hours as needed.  Marland Kitchen ammonium lactate (AMLACTIN) 12 % cream APPLY TOPICALLY AS NEEDED.  Marland Kitchen aspirin 325 MG tablet Take 325 mg by mouth daily.  . calcium-vitamin D (OSCAL WITH D) 500-200 MG-UNIT per tablet Take 1 tablet by mouth daily.  . clotrimazole (LOTRIMIN AF) 1 % cream Apply 1 application topically 2 (two) times daily. [employer] web spaces  . furosemide (LASIX) 20 MG tablet TAKE 1 TABLET BY MOUTH EVERY DAY AS NEEDED FOR LEG SWELLING  . insulin regular (NOVOLIN R,HUMULIN R) 100 units/mL injection Inject 20 units twice daily 15 min before 2 largest meals  . levothyroxine (SYNTHROID, LEVOTHROID) 200 MCG tablet TAKE 1 TABLET (200 MCG TOTAL) BY MOUTH DAILY.  Marland Kitchen loperamide (IMODIUM A-D) 2 MG tablet Take 2 mg by mouth as needed.  . metFORMIN (GLUCOPHAGE) 1000 MG tablet TAKE 1 TABLET BY MOUTH TWICE A DAY WITH MEALS  . metoprolol (LOPRESSOR) 100 MG tablet TAKE 1 TABLET (100 MG TOTAL) BY MOUTH 2 (TWO) TIMES DAILY.  . Multiple Vitamin (MULTIVITAMIN) capsule Take 1 capsule by mouth daily.  . quinapril (ACCUPRIL) 20 MG tablet TAKE 1 TABLET BY MOUTH TWICE A  DAY  . spironolactone (ALDACTONE) 25 MG tablet TAKE 1 TABLET BY MOUTH 2 TIMES A DAY  . tamsulosin (FLOMAX) 0.4 MG CAPS capsule TAKE ONE CAPSULE BY MOUTH EVERY DAY  . urea (CARMOL) 40 % CREA apply to bottoms of heels every day as needed for cracking heels.  . Omega-3 Fatty Acids (OMEGA 3 PO) Take 2 capsules by mouth daily.   No current facility-administered medications on file prior to visit.    Review of Systems Per HPI unless specifically indicated above    Objective:    BP 138/88  Pulse 72  Temp(Src) 98.2 F (36.8 C) (Oral)  Ht 6\' 2"  (1.88 m)   Wt 462 lb 4 oz (209.675 kg)  BMI 59.32 kg/m2  Physical Exam  Nursing note and vitals reviewed. Constitutional: He is oriented to person, place, and time. He appears well-developed and well-nourished. No distress.  Morbidly obese  HENT:  Head: Normocephalic and atraumatic.  Right Ear: Hearing, tympanic membrane, external ear and ear canal normal.  Left Ear: Hearing, tympanic membrane, external ear and ear canal normal.  Nose: Nose normal.  Mouth/Throat: Uvula is midline, oropharynx is clear and moist and mucous membranes are normal. No oropharyngeal exudate, posterior oropharyngeal edema or posterior oropharyngeal erythema.  Eyes: Conjunctivae and EOM are normal. Pupils are equal, round, and reactive to light. No scleral icterus.  Neck: Normal range of motion. Neck supple. Carotid bruit is not present. No thyromegaly present.  Cardiovascular: Normal rate, regular rhythm, normal heart sounds and intact distal pulses.   No murmur heard. Pulses:      Radial pulses are 2+ on the right side, and 2+ on the left side.  Pulmonary/Chest: Effort normal and breath sounds normal. No respiratory distress. He has no wheezes. He has no rales.  Abdominal: Soft. Bowel sounds are normal. He exhibits no distension and no mass. There is no tenderness. There is no rebound and no guarding.  Musculoskeletal: Normal range of motion. He exhibits edema (chronic tight lymphedema).  No midline lumbar spine tenderness No paraspinous mm tenderness  Lymphadenopathy:    He has no cervical adenopathy.  Neurological: He is alert and oriented to person, place, and time.  CN grossly intact, station and gait intact Recall 3/3  Calculation 5/5 serial 3s  Skin: Skin is warm and dry. No rash noted.  Psychiatric: He has a normal mood and affect. His behavior is normal. Judgment and thought content normal.   Results for orders placed in visit on 12/13/13  LIPID PANEL      Result Value Ref Range   Cholesterol 208 (*) 0 -  200 mg/dL   Triglycerides 157.0 (*) 0.0 - 149.0 mg/dL   HDL 53.00  >39.00 mg/dL   VLDL 31.4  0.0 - 40.0 mg/dL   LDL Cholesterol 124 (*) 0 - 99 mg/dL   Total CHOL/HDL Ratio 4     NonHDL 155.00    TSH      Result Value Ref Range   TSH 3.59  0.35 - 4.50 uIU/mL  HEMOGLOBIN A1C      Result Value Ref Range   Hemoglobin A1C 8.7 (*) 4.6 - 6.5 %  PSA      Result Value Ref Range   PSA 1.32  0.10 - 4.00 ng/mL  BASIC METABOLIC PANEL      Result Value Ref Range   Sodium 137  135 - 145 mEq/L   Potassium 4.1  3.5 - 5.1 mEq/L   Chloride 99  96 - 112 mEq/L  CO2 32  19 - 32 mEq/L   Glucose, Bld 237 (*) 70 - 99 mg/dL   BUN 14  6 - 23 mg/dL   Creatinine, Ser 1.0  0.4 - 1.5 mg/dL   Calcium 9.6  8.4 - 10.5 mg/dL   GFR 96.54  >60.00 mL/min      Assessment & Plan:   Problem List Items Addressed This Visit   Medicare annual wellness visit, subsequent - Primary     I have personally reviewed the Medicare Annual Wellness questionnaire and have noted 1. The patient's medical and social history 2. Their use of alcohol, tobacco or illicit drugs 3. Their current medications and supplements 4. The patient's functional ability including ADL's, fall risks, home safety risks and hearing or visual impairment. 5. Diet and physical activity 6. Evidence for depression or mood disorders The patients weight, height, BMI have been recorded in the chart.  Hearing and vision has been addressed. I have made referrals, counseling and provided education to the patient based review of the above and I have provided the pt with a written personalized care plan for preventive services. Provider list updated - see scanned questionairre.  Reviewed preventative protocols and updated unless pt declined.    Lumbago     Longstanding issue. Check baseline lumbar films, continue tylenol for pain.    Relevant Orders      DG Lumbar Spine Complete   Hypothyroid     TSH stable.    HTN (hypertension)     Chronic, stable.  Continue regimen.    Relevant Medications      atorvastatin (LIPITOR) tablet   HLD (hyperlipidemia)     LDL not at goal - change simvastatin to lipitor 40mg  daily.    Relevant Medications      atorvastatin (LIPITOR) tablet   Diabetes type 2, uncontrolled     Encouraged he schedule eye exam as due. Elevated sugars recently - discussed slow titration of levemir to goal 60 u daily if tolerated. See instructions RTC 3 mo DM f/u visit    Relevant Medications      atorvastatin (LIPITOR) tablet      Insulin Detemir (LEVEMIR) 100 UNIT/ML Pen   Advanced care planning/counseling discussion     Advanced directives - requests info on this. Has not thought about this.        Follow up plan: Return in about 3 months (around 04/10/2014), or as needed, for follow up visit.

## 2014-01-15 ENCOUNTER — Other Ambulatory Visit: Payer: Self-pay | Admitting: Family Medicine

## 2014-01-15 ENCOUNTER — Other Ambulatory Visit: Payer: Self-pay | Admitting: Podiatrist

## 2014-02-01 ENCOUNTER — Other Ambulatory Visit: Payer: Self-pay | Admitting: Family Medicine

## 2014-02-13 ENCOUNTER — Other Ambulatory Visit: Payer: Self-pay | Admitting: Family Medicine

## 2014-04-09 DIAGNOSIS — E109 Type 1 diabetes mellitus without complications: Secondary | ICD-10-CM | POA: Diagnosis not present

## 2014-04-24 DIAGNOSIS — I890 Lymphedema, not elsewhere classified: Secondary | ICD-10-CM | POA: Diagnosis not present

## 2014-05-25 DIAGNOSIS — I890 Lymphedema, not elsewhere classified: Secondary | ICD-10-CM | POA: Diagnosis not present

## 2014-06-01 ENCOUNTER — Other Ambulatory Visit: Payer: Self-pay | Admitting: Family Medicine

## 2014-06-23 DIAGNOSIS — I890 Lymphedema, not elsewhere classified: Secondary | ICD-10-CM | POA: Diagnosis not present

## 2014-06-26 ENCOUNTER — Other Ambulatory Visit: Payer: Self-pay | Admitting: Family Medicine

## 2014-07-09 ENCOUNTER — Other Ambulatory Visit: Payer: Self-pay | Admitting: Family Medicine

## 2014-07-13 DIAGNOSIS — E109 Type 1 diabetes mellitus without complications: Secondary | ICD-10-CM | POA: Diagnosis not present

## 2014-07-15 ENCOUNTER — Other Ambulatory Visit: Payer: Self-pay | Admitting: Family Medicine

## 2014-07-22 ENCOUNTER — Other Ambulatory Visit: Payer: Self-pay | Admitting: Family Medicine

## 2014-07-23 ENCOUNTER — Other Ambulatory Visit: Payer: Self-pay | Admitting: Family Medicine

## 2014-07-24 DIAGNOSIS — I890 Lymphedema, not elsewhere classified: Secondary | ICD-10-CM | POA: Diagnosis not present

## 2014-08-18 ENCOUNTER — Other Ambulatory Visit: Payer: Self-pay | Admitting: Family Medicine

## 2014-08-23 DIAGNOSIS — I890 Lymphedema, not elsewhere classified: Secondary | ICD-10-CM | POA: Diagnosis not present

## 2014-09-14 ENCOUNTER — Other Ambulatory Visit: Payer: Self-pay | Admitting: Family Medicine

## 2014-09-20 ENCOUNTER — Other Ambulatory Visit: Payer: Self-pay

## 2014-09-20 DIAGNOSIS — IMO0002 Reserved for concepts with insufficient information to code with codable children: Secondary | ICD-10-CM

## 2014-09-20 DIAGNOSIS — E1165 Type 2 diabetes mellitus with hyperglycemia: Secondary | ICD-10-CM

## 2014-09-26 ENCOUNTER — Other Ambulatory Visit: Payer: Commercial Managed Care - HMO

## 2014-09-27 LAB — HM DIABETES EYE EXAM

## 2014-10-12 DIAGNOSIS — E109 Type 1 diabetes mellitus without complications: Secondary | ICD-10-CM | POA: Diagnosis not present

## 2014-11-27 ENCOUNTER — Telehealth: Payer: Self-pay | Admitting: Family Medicine

## 2014-11-27 NOTE — Telephone Encounter (Signed)
[facility]     Patient Name: Derek Bass Initial Comment caller states he has numbness in his left finger tips  DOB: 07-19-1940      Nurse Assessment  Nurse: Venetia Maxon, RN, Manuela Schwartz Date/Time (Eastern Time): 11/27/2014 2:52:03 PM  Confirm and document reason for call. If symptomatic, describe symptoms. ---caller states he has numbness in his left finger tips . for past 3 days . He denies numbness anywhere else.   Has the patient traveled out of the country within the last 30 days? ---No   Does the patient require triage? ---Yes   Related visit to physician within the last 2 weeks? ---No   Does the PT have any chronic conditions? (i.e. diabetes, asthma, etc.) ---Yes   List chronic conditions. ---HTN IDDM CBS this am ** cannot recall cholesterol arthritis RT knee     Guidelines     Guideline Title Affirmed Question Affirmed Notes   Neurologic Deficit [1] Numbness (i.e., loss of sensation) of the face, arm / hand, or leg / foot on one side of the body AND [2] sudden onset AND [3] brief (now gone)    Final Disposition User   Go to ED Now (or PCP triage) Venetia Maxon, RN, Manuela Schwartz       Disagree/Comply: [place]     Patient Name: Derek Bass Initial Comment caller states he has numbness in his left finger tips  DOB: 10/17/1940      Nurse Assessment  Nurse: Venetia Maxon, RN, Manuela Schwartz Date/Time (Eastern Time): 11/27/2014 2:52:03 PM  Confirm and document reason for call. If symptomatic, describe symptoms. ---caller states he has numbness in his left finger tips . for past 3 days . He denies numbness anywhere else.   Has the patient traveled out of the country within the last 30 days? ---No   Does the patient require triage? ---Yes   Related visit to physician within the last 2 weeks? ---No   Does the  PT have any chronic conditions? (i.e. diabetes, asthma, etc.) ---Yes   List chronic conditions. ---HTN IDDM CBS this am ** cannot recall cholesterol arthritis RT knee     Guidelines     Guideline Title Affirmed Question Affirmed Notes   Neurologic Deficit [1] Numbness (i.e., loss of sensation) of the face, arm / hand, or leg / foot on one side of the body AND [2] sudden onset AND [3] brief (now gone)    Final Disposition User   Go to ED Now (or PCP triage) Venetia Maxon, RN, Manuela Schwartz       Disagree/Comply: Comply

## 2014-11-27 NOTE — Telephone Encounter (Signed)
Do not think patient needs ER eval - would offer eval in office this week.

## 2014-11-27 NOTE — Telephone Encounter (Signed)
PLEASE NOTE: All timestamps contained within this report are represented as [place] Standard Time. CONFIDENTIALTY NOTICE: This fax transmission is intended only for the addressee. It contains information that is legally privileged, confidential or otherwise protected from use or disclosure. If you are not the intended recipient, you are strictly prohibited from reviewing, disclosing, copying using or disseminating any of this information or taking any action in reliance on or regarding this information. If you have received this fax in error, please notify us immediately by telephone so that we can arrange for its return to [place]. Phone: [phone], Toll-Free: [phone], Fax: [phone] Page: 1 of 2 Call Id: [id number] [place] Patient Name: Derek Bass Gender: Male DOB: [birth year] Age: 74 Y 99 M 27 D Return Phone Number: [phone] (Primary) Address: City/State/Zip:  Client [facility] - Client Client Site [place], [place] Type Call Call Type Triage / Clinical Relationship To Patient Self Appointment Disposition EMR Appointment Not Necessary Info pasted into Epic Yes Return Phone Number [phone] (Primary) Chief Complaint NUMBNESS - sudden on one side of face or body Initial Comment caller states he has numbness in his left finger tips PreDisposition Home Care Nurse Assessment Nurse: Venetia Maxon, RN, Manuela Schwartz Date/Time (Eastern Time): 11/27/2014 2:52:03 PM Confirm and document reason for call. If symptomatic, describe symptoms. ---caller states he has numbness in his left finger tips . for past 3 days . He denies numbness anywhere else. Has the patient traveled out of the country within the last 30 days? ---No Does the patient require triage? ---Yes Related visit to physician within the last 2  weeks? ---No Does the PT have any chronic conditions? (i.e. diabetes, asthma, etc.) ---Yes List chronic conditions. ---HTN IDDM CBS this am ** cannot recall cholesterol arthritis RT knee Guidelines Guideline Title Affirmed Question Affirmed Notes Nurse Date/Time (Eastern Time) Neurologic Deficit [1] Numbness (i.e., loss of sensation) of the face, arm / hand, or leg / foot on one side of the body AND [2] sudden onset AND [3] brief (now gone) Venetia Maxon, Therapist, sports, Manuela Schwartz 11/27/2014 2:54:31 PM Disp. Time Eilene Ghazi Time) Disposition Final User 11/27/2014 2:50:54 PM Send to Urgent Queue [facility] 11/27/2014 2:56:33 PM Go to ED Now (or PCP triage) Yes Venetia Maxon, RN, Edwena Bunde Understands: Yes PLEASE NOTE: All timestamps contained within this report are represented as [place] Standard Time. CONFIDENTIALTY NOTICE: This fax transmission is intended only for the addressee. It contains information that is legally privileged, confidential or otherwise protected from use or disclosure. If you are not the intended recipient, you are strictly prohibited from reviewing, disclosing, copying using or disseminating any of this information or taking any action in reliance on or regarding this information. If you have received this fax in error, please notify us immediately by telephone so that we can arrange for its return to [place]. Phone: [phone], Toll-Free: [phone], Fax: [phone] Page: 2 of 2 Call Id: [id number] Disagree/Comply: Comply Care Advice Given Per Guideline GO TO ED NOW (OR PCP TRIAGE): DRIVING: Another adult should drive. CARE ADVICE given per Neurologic Deficit (Adult) guideline. After Care Instructions Given Call Event Type User Date / Time Description

## 2014-11-28 NOTE — Telephone Encounter (Signed)
Message left for patient to call and schedule appt here instead of ER eval. It doesn't appear that he was evaluated at ER last night or today up until this point.

## 2014-12-29 ENCOUNTER — Other Ambulatory Visit: Payer: Self-pay | Admitting: Family Medicine

## 2015-01-12 DIAGNOSIS — E109 Type 1 diabetes mellitus without complications: Secondary | ICD-10-CM | POA: Diagnosis not present

## 2015-01-14 ENCOUNTER — Other Ambulatory Visit: Payer: Self-pay | Admitting: Family Medicine

## 2015-01-24 ENCOUNTER — Telehealth: Payer: Self-pay | Admitting: Family Medicine

## 2015-01-24 NOTE — Telephone Encounter (Signed)
Please schedule patient for medicare wellness visit. Thanks!

## 2015-01-28 NOTE — Telephone Encounter (Signed)
Left message  Asking pt to call office

## 2015-01-31 NOTE — Telephone Encounter (Signed)
Left message asking pt to call office  °

## 2015-02-05 NOTE — Telephone Encounter (Signed)
Appointment 1/10 Pt aware Please close

## 2015-03-02 ENCOUNTER — Other Ambulatory Visit: Payer: Self-pay | Admitting: Family Medicine

## 2015-04-08 ENCOUNTER — Telehealth: Payer: Self-pay | Admitting: Family Medicine

## 2015-04-08 ENCOUNTER — Telehealth: Payer: Self-pay

## 2015-04-08 ENCOUNTER — Encounter: Payer: Self-pay | Admitting: Family Medicine

## 2015-04-08 NOTE — Telephone Encounter (Signed)
Pt called stated that he had green stuff coming out of his nose and didn't feel good enough to come in.  Offered 4 different appointment times for acute visit and pt did not want to accept any of those times today or this week to address his acute needs.  Explained that the insurance will kick back the co-pay on a wellness visit accompanied by an acute visit, he stated that did not matter.  Rescheduled appointment for Monday 04/14/15 for AWV + acute.  Pt to arrive at 11:30 am to complete ppw, states he did not receive from 03/13/16.

## 2015-04-08 NOTE — Telephone Encounter (Signed)
Left v/m for pt to call [facility] to reschedule CPX. Cancelled CPX as noted. Pt has already spoken with Vaughan Basta and rescheduled appt.

## 2015-04-14 ENCOUNTER — Encounter: Payer: Self-pay | Admitting: Family Medicine

## 2015-04-14 ENCOUNTER — Ambulatory Visit (INDEPENDENT_AMBULATORY_CARE_PROVIDER_SITE_OTHER): Payer: Commercial Managed Care - HMO | Admitting: Family Medicine

## 2015-04-14 VITALS — BP 130/90 | HR 92 | Temp 97.2°F | Wt >= 6400 oz

## 2015-04-14 DIAGNOSIS — Z6841 Body Mass Index (BMI) 40.0 and over, adult: Secondary | ICD-10-CM

## 2015-04-14 DIAGNOSIS — E1165 Type 2 diabetes mellitus with hyperglycemia: Secondary | ICD-10-CM | POA: Diagnosis not present

## 2015-04-14 DIAGNOSIS — Z0000 Encounter for general adult medical examination without abnormal findings: Secondary | ICD-10-CM | POA: Diagnosis not present

## 2015-04-14 DIAGNOSIS — E039 Hypothyroidism, unspecified: Secondary | ICD-10-CM

## 2015-04-14 DIAGNOSIS — E118 Type 2 diabetes mellitus with unspecified complications: Secondary | ICD-10-CM

## 2015-04-14 DIAGNOSIS — I10 Essential (primary) hypertension: Secondary | ICD-10-CM

## 2015-04-14 DIAGNOSIS — K029 Dental caries, unspecified: Secondary | ICD-10-CM

## 2015-04-14 DIAGNOSIS — Z7189 Other specified counseling: Secondary | ICD-10-CM

## 2015-04-14 DIAGNOSIS — Z8546 Personal history of malignant neoplasm of prostate: Secondary | ICD-10-CM

## 2015-04-14 DIAGNOSIS — Z86010 Personal history of colonic polyps: Secondary | ICD-10-CM | POA: Diagnosis not present

## 2015-04-14 DIAGNOSIS — Z794 Long term (current) use of insulin: Secondary | ICD-10-CM | POA: Diagnosis not present

## 2015-04-14 DIAGNOSIS — IMO0002 Reserved for concepts with insufficient information to code with codable children: Secondary | ICD-10-CM

## 2015-04-14 DIAGNOSIS — I890 Lymphedema, not elsewhere classified: Secondary | ICD-10-CM

## 2015-04-14 DIAGNOSIS — Z23 Encounter for immunization: Secondary | ICD-10-CM

## 2015-04-14 DIAGNOSIS — E785 Hyperlipidemia, unspecified: Secondary | ICD-10-CM | POA: Diagnosis not present

## 2015-04-14 DIAGNOSIS — E109 Type 1 diabetes mellitus without complications: Secondary | ICD-10-CM | POA: Diagnosis not present

## 2015-04-14 DIAGNOSIS — J019 Acute sinusitis, unspecified: Secondary | ICD-10-CM

## 2015-04-14 LAB — COMPREHENSIVE METABOLIC PANEL
ALBUMIN: 3.8 g/dL (ref 3.5–5.2)
ALK PHOS: 84 U/L (ref 39–117)
ALT: 20 U/L (ref 0–53)
AST: 19 U/L (ref 0–37)
BUN: 13 mg/dL (ref 6–23)
CALCIUM: 9.6 mg/dL (ref 8.4–10.5)
CO2: 29 mEq/L (ref 19–32)
Chloride: 102 mEq/L (ref 96–112)
Creatinine, Ser: 0.81 mg/dL (ref 0.40–1.50)
GFR: 119.84 mL/min (ref >60.00)
Glucose, Bld: 108 mg/dL — ABNORMAL HIGH (ref 70–99)
POTASSIUM: 4 meq/L (ref 3.5–5.1)
Sodium: 142 mEq/L (ref 135–145)
TOTAL PROTEIN: 7.4 g/dL (ref 6.0–8.3)
Total Bilirubin: 0.6 mg/dL (ref 0.2–1.2)

## 2015-04-14 LAB — MICROALBUMIN / CREATININE URINE RATIO
Creatinine,U: 301.4 mg/dL
MICROALB UR: 14.3 mg/dL — AB (ref 0.0–1.9)
Microalb Creat Ratio: 4.7 mg/g (ref 0.0–30.0)

## 2015-04-14 LAB — PSA: PSA: 1.51 ng/mL (ref 0.10–4.00)

## 2015-04-14 LAB — LIPID PANEL
CHOLESTEROL: 178 mg/dL (ref 0–200)
HDL: 51.2 mg/dL (ref >39.00)
LDL Cholesterol: 102 mg/dL — ABNORMAL HIGH (ref 0–99)
NonHDL: 126.84
TRIGLYCERIDES: 122 mg/dL (ref 0.0–149.0)
Total CHOL/HDL Ratio: 3
VLDL: 24.4 mg/dL (ref 0.0–40.0)

## 2015-04-14 LAB — TSH: TSH: 4.18 u[IU]/mL (ref 0.35–4.50)

## 2015-04-14 LAB — HEMOGLOBIN A1C: Hgb A1c MFr Bld: 7.4 % — ABNORMAL HIGH (ref 4.6–6.5)

## 2015-04-14 MED ORDER — AMOXICILLIN-POT CLAVULANATE 875-125 MG PO TABS
1.0000 | ORAL_TABLET | Freq: Two times a day (BID) | ORAL | Status: AC
Start: 2015-04-14 — End: 2015-04-24

## 2015-04-14 MED ORDER — GLUCOSE 4 G PO CHEW: 1.0000 | CHEWABLE_TABLET | ORAL | Status: AC | PRN

## 2015-04-14 NOTE — Assessment & Plan Note (Signed)
Chronic, stable. Continue current regimen. 

## 2015-04-14 NOTE — Progress Notes (Signed)
BP 130/90 mmHg  Pulse 92  Temp(Src) 97.2 F (36.2 C) (Oral)  Wt 453 lb 4 oz (205.593 kg)   CC: medicare wellness visit  Subjective:    Patient ID: Derek Bass, male    DOB: 11-Apr-1940, 75 y.o.   MRN: [id number]  HPI: Derek Bass is a 75 y.o. male presenting on 04/14/2015 for Annual Exam   Last visit was 12/2013 for AMW.   3wk h/o sinus congestion with green mucous. No cough, fevers/chills. Intermittent phlegm. Has tried alka seltzer cold plus. + sick contacts at home. Not around smokers.   HLD - last visit we changed simvastatin to atorvastatin 40mg  daily.  DM - last visit we increased levemir to 60u daily. Complaint with this and metformin 1000mg  bid. Not complaint however with rapid insulin regimen - hasn't been taking humulin due to low sugars and hypoglycemic sxs. Drinks juice or eats candy bar with low sugars. Poor diet - mainly consists of crackers and chicken salad from [employer].   Chronic lymphedema - uses lymphedema pump and graduated compression stockings per [facility] (Schnier). Satisfied with effect.  Vision screen - done 09/2014 by eye doctor.  Hearing screen - failed. Had hearing test in [place]. Cannot afford hearing aides.  Falls - 1 in the past year. Tripped while at home. Uses cane. Declines fall prevention program.  Denies depression, anhedonia.   Preventative: COLONOSCOPY Date: 2012 tubular adenoma mult polyps, int/ext hemorrhoids, diverticula, rpt 3 years. Agrees to re referral.  History of prostate cancer tx seed implants in [place] now followed by Dr. Gaynelle Arabian with yearly PSAs. Did see Dr. Gaynelle Arabian 2-3 mo ago. Requests PSA drawn here from here on out but will continue to follow with urology. Flu - today Pneumovax 05/2009, prevnar 04/2013 Td 11/2011 zostavax - 2011 Advanced directives - previously provided with advanced directive packet Last saw dentist 2016.  Seat belt use discussed No changing moles on skin  Caffeine: 1 cup/day coffee,  soda/lemonade with food  Lives alone. Family in area.  Edu: HS  Retired, previously Scientist, physiological.  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  2 biological children, 4 step children  Activity: no regular exercise  Diet: seldom water, fruits/vegetables seldom   Relevant past medical, surgical, family and social history reviewed and updated as indicated. Interim medical history since our last visit reviewed. Allergies and medications reviewed and updated. Current Outpatient Prescriptions on File Prior to Visit  Medication Sig  . acetaminophen (TYLENOL) 650 MG CR tablet Take 650 mg by mouth every 8 (eight) hours as needed.  Marland Kitchen ammonium lactate (AMLACTIN) 12 % cream APPLY TOPICALLY AS NEEDED.  Marland Kitchen aspirin 325 MG tablet Take 325 mg by mouth daily.  Marland Kitchen atorvastatin (LIPITOR) 40 MG tablet Take one tablet daily **MUST HAVE PHYSICAL FOR FURTHER REFILLS**  . calcium-vitamin D (OSCAL WITH D) 500-200 MG-UNIT per tablet Take 1 tablet by mouth daily.  . clotrimazole (LOTRIMIN) 1 % cream APPLY TOPICALLY TWICE A DAY TO INTERDIGITAL WEB SPACES  . LEVEMIR FLEXTOUCH 100 UNIT/ML Pen INJECT 60 UNITS INTO THE SKIN DAILY WITH BREAKFAST. **MUST HAVE PHYSICAL FOR FURTHER REFILLS**  . levothyroxine (SYNTHROID, LEVOTHROID) 200 MCG tablet TAKE 1 TABLET (200 MCG TOTAL) BY MOUTH DAILY.  Marland Kitchen loperamide (IMODIUM A-D) 2 MG tablet Take 2 mg by mouth as needed.  . metFORMIN (GLUCOPHAGE) 1000 MG tablet TAKE 1 TABLET BY MOUTH TWICE A DAY  WITH MEALS  . metoprolol (LOPRESSOR) 100 MG tablet TAKE 1 TABLET BY MOUTH 2 TIMES DAILY.  . Multiple Vitamin (MULTIVITAMIN) capsule Take 1 capsule by mouth daily.  . quinapril (ACCUPRIL) 20 MG tablet TAKE 1 TABLET BY MOUTH TWICE A DAY  . spironolactone (ALDACTONE) 25 MG tablet TAKE 1 TABLET BY MOUTH 2 TIMES A DAY  . tamsulosin (FLOMAX) 0.4 MG CAPS capsule TAKE ONE CAPSULE BY MOUTH EVERY DAY  . urea (CARMOL) 40 % CREA APPLY  TO BOTTOMS OF HEELS EVERY DAY AS NEEDED FOR CRACKING HEELS  . Omega-3 Fatty Acids (OMEGA 3 PO) Take 2 capsules by mouth daily. Reported on 04/14/2015   No current facility-administered medications on file prior to visit.    Review of Systems Per HPI unless specifically indicated in ROS section     Objective:    BP 130/90 mmHg  Pulse 92  Temp(Src) 97.2 F (36.2 C) (Oral)  Wt 453 lb 4 oz (205.593 kg)  Wt Readings from Last 3 Encounters:  04/14/15 453 lb 4 oz (205.593 kg)  01/08/14 462 lb 4 oz (209.675 kg)  08/07/13 457 lb 4 oz (207.407 kg)   Body mass index is 58.17 kg/(m^2).  Physical Exam  Constitutional: He is oriented to person, place, and time. He appears well-developed and well-nourished. No distress.  Morbidly obese  HENT:  Head: Normocephalic and atraumatic.  Right Ear: Hearing, tympanic membrane, external ear and ear canal normal.  Left Ear: Hearing, tympanic membrane, external ear and ear canal normal.  Nose: Mucosal edema (nasal mucosal irritation) present. No rhinorrhea. Right sinus exhibits no maxillary sinus tenderness and no frontal sinus tenderness. Left sinus exhibits no maxillary sinus tenderness and no frontal sinus tenderness.  Mouth/Throat: Uvula is midline, oropharynx is clear and moist and mucous membranes are normal. Dental caries present. No oropharyngeal exudate, posterior oropharyngeal edema, posterior oropharyngeal erythema or tonsillar abscesses.  Eyes: Conjunctivae and EOM are normal. Pupils are equal, round, and reactive to light. No scleral icterus.  Neck: Normal range of motion. Neck supple. Carotid bruit is not present. No thyromegaly present.  Cardiovascular: Normal rate, regular rhythm, normal heart sounds and intact distal pulses.   No murmur heard. Pulses:      Radial pulses are 2+ on the right side, and 2+ on the left side.  Pulmonary/Chest: Effort normal and breath sounds normal. No respiratory distress. He has no wheezes. He has no rales.    Abdominal: Soft. Bowel sounds are normal. He exhibits no distension and no mass. There is no tenderness. There is no rebound and no guarding.  Musculoskeletal: Normal range of motion. He exhibits edema (chronic lymphedema R>L).  See HPI for foot exam if done  Lymphadenopathy:    He has no cervical adenopathy.  Neurological: He is alert and oriented to person, place, and time.  CN grossly intact, station and gait intact Recall 3/3 Calculation 5/5 serial 3s  Skin: Skin is warm and dry. No rash noted.  Psychiatric: He has a normal mood and affect. His behavior is normal. Judgment and thought content normal.  Nursing note and vitals reviewed.  Results for orders placed or performed in visit on 04/14/15  HM DIABETES EYE EXAM  Result Value Ref Range   HM Diabetic Eye Exam No Retinopathy No Retinopathy      Assessment & Plan:   Problem List Items Addressed This Visit    Morbid obesity with BMI of 50.0-59.9, adult ([place])    Continue to monitor.  Relevant Medications   glucose 4 GM chewable tablet   Medicare annual wellness visit, subsequent - Primary    I have personally reviewed the Medicare Annual Wellness questionnaire and have noted 1. The patient's medical and social history 2. Their use of alcohol, tobacco or illicit drugs 3. Their current medications and supplements 4. The patient's functional ability including ADL's, fall risks, home safety risks and hearing or visual impairment. Cognitive function has been assessed and addressed as indicated.  5. Diet and physical activity 6. Evidence for depression or mood disorders The patients weight, height, BMI have been recorded in the chart. I have made referrals, counseling and provided education to the patient based on review of the above and I have provided the pt with a written personalized care plan for preventive services. Provider list updated.. See scanned questionairre as needed for further documentation. Reviewed  preventative protocols and updated unless pt declined.       Lymphedema    Encouraged continued regular pump use.      Hypothyroidism    Check TSH today.       Relevant Orders   TSH   HTN (hypertension)    Chronic, stable. Continue current regimen.      Relevant Medications   furosemide (LASIX) 20 MG tablet   HLD (hyperlipidemia)    Check FLP today. Continue atorvastatin.      Relevant Medications   furosemide (LASIX) 20 MG tablet   Other Relevant Orders   Lipid panel   Comprehensive metabolic panel   Diabetes type 2, uncontrolled ([place])    Updated chart - eye exam 09/2014, foot exam today.  Low sugars endorsed. Will stop humulin R.  Check A1c today.  Pt agrees to return in 2 months for diabetes follow up.      Relevant Medications   glucose 4 GM chewable tablet   Other Relevant Orders   Hemoglobin A1c   Microalbumin / creatinine urine ratio   Dental caries    Encouraged he f/u with dentist.      Advanced care planning/counseling discussion    Advanced directives - previously provided with advanced directive packet      Acute sinusitis    Given duration and comorbidities, treat aggressively with augmentin course. Pt agrees.      Relevant Medications   amoxicillin-clavulanate (AUGMENTIN) 875-125 MG tablet    Other Visit Diagnoses    History of colonic polyps        Relevant Orders    Ambulatory referral to Gastroenterology        Follow up plan: Return in about 2 months (around 06/12/2015), or as needed, for follow up visit.

## 2015-04-14 NOTE — Assessment & Plan Note (Signed)
Continue to monitor

## 2015-04-14 NOTE — Addendum Note (Signed)
Addended by: Daralene Milch C on: 04/14/2015 01:52 PM   Modules accepted: Miquel Dunn

## 2015-04-14 NOTE — Assessment & Plan Note (Signed)
Encouraged continued regular pump use.

## 2015-04-14 NOTE — Progress Notes (Signed)
Pre visit review using our clinic review tool, if applicable. No additional management support is needed unless otherwise documented below in the visit note. 

## 2015-04-14 NOTE — Assessment & Plan Note (Signed)
Check FLP today. Continue atorvastatin. 

## 2015-04-14 NOTE — Addendum Note (Signed)
Addended by: Royann Shivers A on: 04/14/2015 02:16 PM   Modules accepted: Orders

## 2015-04-14 NOTE — Assessment & Plan Note (Addendum)
Updated chart - eye exam 09/2014, foot exam today.  Low sugars endorsed. Will stop humulin R.  Check A1c today.  Pt agrees to return in 2 months for diabetes follow up.

## 2015-04-14 NOTE — Assessment & Plan Note (Signed)
Given duration and comorbidities, treat aggressively with augmentin course. Pt agrees.

## 2015-04-14 NOTE — Assessment & Plan Note (Signed)

## 2015-04-14 NOTE — Patient Instructions (Addendum)
labwork today. Flu shot today.  Take antibiotic for possible sinus infection. Stop humulin. Continue levemir 60u daily. Continue metformin. We will refer you back for colonoscopy. Call to schedule appointment with urologist as you're due.  Continue lymphedema pump which is helping.  Return in 2 months for diabetes follow up.  Bring log of sugars to next appointment. Try to schedule sugar checks and schedule meals at same time each day.   Health Maintenance, Male A healthy lifestyle and preventative care can promote health and wellness.  Maintain regular health, dental, and eye exams.  Eat a healthy diet. Foods like vegetables, fruits, whole grains, low-fat dairy products, and lean protein foods contain the nutrients you need and are low in calories. Decrease your intake of foods high in solid fats, added sugars, and salt. Get information about a proper diet from your health care provider, if necessary.  Regular physical exercise is one of the most important things you can do for your health. Most adults should get at least 150 minutes of moderate-intensity exercise (any activity that increases your heart rate and causes you to sweat) each week. In addition, most adults need muscle-strengthening exercises on 2 or more days a week.   Maintain a healthy weight. The body mass index (BMI) is a screening tool to identify possible weight problems. It provides an estimate of body fat based on height and weight. Your health care provider can find your BMI and can help you achieve or maintain a healthy weight. For males 20 years and older:  A BMI below 18.5 is considered underweight.  A BMI of 18.5 to 24.9 is normal.  A BMI of 25 to 29.9 is considered overweight.  A BMI of 30 and above is considered obese.  Maintain normal blood lipids and cholesterol by exercising and minimizing your intake of saturated fat. Eat a balanced diet with plenty of fruits and vegetables. Blood tests for lipids and  cholesterol should begin at age 29 and be repeated every 5 years. If your lipid or cholesterol levels are high, you are over age 50, or you are at high risk for heart disease, you may need your cholesterol levels checked more frequently.Ongoing high lipid and cholesterol levels should be treated with medicines if diet and exercise are not working.  If you smoke, find out from your health care provider how to quit. If you do not use tobacco, do not start.  Lung cancer screening is recommended for adults aged 82-80 years who are at high risk for developing lung cancer because of a history of smoking. A yearly low-dose CT scan of the lungs is recommended for people who have at least a 30-pack-year history of smoking and are current smokers or have quit within the past 15 years. A pack year of smoking is smoking an average of 1 pack of cigarettes a day for 1 year (for example, a 30-pack-year history of smoking could mean smoking 1 pack a day for 30 years or 2 packs a day for 15 years). Yearly screening should continue until the smoker has stopped smoking for at least 15 years. Yearly screening should be stopped for people who develop a health problem that would prevent them from having lung cancer treatment.  If you choose to drink alcohol, do not have more than 2 drinks per day. One drink is considered to be 12 oz (360 mL) of beer, 5 oz (150 mL) of wine, or 1.5 oz (45 mL) of liquor.  Avoid the use of  street drugs. Do not share needles with anyone. Ask for help if you need support or instructions about stopping the use of drugs.  High blood pressure causes heart disease and increases the risk of stroke. High blood pressure is more likely to develop in:  People who have blood pressure in the end of the normal range (100-139/85-89 mm Hg).  People who are overweight or obese.  People who are African American.  If you are 70-35 years of age, have your blood pressure checked every 3-5 years. If you are 55  years of age or older, have your blood pressure checked every year. You should have your blood pressure measured twice--once when you are at a hospital or clinic, and once when you are not at a hospital or clinic. Record the average of the two measurements. To check your blood pressure when you are not at a hospital or clinic, you can use:  An automated blood pressure machine at a pharmacy.  A home blood pressure monitor.  If you are 55-38 years old, ask your health care provider if you should take aspirin to prevent heart disease.  Diabetes screening involves taking a blood sample to check your fasting blood sugar level. This should be done once every 3 years after age 64 if you are at a normal weight and without risk factors for diabetes. Testing should be considered at a younger age or be carried out more frequently if you are overweight and have at least 1 risk factor for diabetes.  Colorectal cancer can be detected and often prevented. Most routine colorectal cancer screening begins at the age of 16 and continues through age 53. However, your health care provider may recommend screening at an earlier age if you have risk factors for colon cancer. On a yearly basis, your health care provider may provide home test kits to check for hidden blood in the stool. A small camera at the end of a tube may be used to directly examine the colon (sigmoidoscopy or colonoscopy) to detect the earliest forms of colorectal cancer. Talk to your health care provider about this at age 68 when routine screening begins. A direct exam of the colon should be repeated every 5-10 years through age 1, unless early forms of precancerous polyps or small growths are found.  People who are at an increased risk for hepatitis B should be screened for this virus. You are considered at high risk for hepatitis B if:  You were born in a country where hepatitis B occurs often. Talk with your health care provider about which countries  are considered high risk.  Your parents were born in a high-risk country and you have not received a shot to protect against hepatitis B (hepatitis B vaccine).  You have HIV or AIDS.  You use needles to inject street drugs.  You live with, or have sex with, someone who has hepatitis B.  You are a man who has sex with other men (MSM).  You get hemodialysis treatment.  You take certain medicines for conditions like cancer, organ transplantation, and autoimmune conditions.  Hepatitis C blood testing is recommended for all people born from [birth year] through 1964 and any individual with known risk factors for hepatitis C.  Healthy men should no longer receive prostate-specific antigen (PSA) blood tests as part of routine cancer screening. Talk to your health care provider about prostate cancer screening.  Testicular cancer screening is not recommended for adolescents or adult males who have no symptoms. Screening  includes self-exam, a health care provider exam, and other screening tests. Consult with your health care provider about any symptoms you have or any concerns you have about testicular cancer.  Practice safe sex. Use condoms and avoid high-risk sexual practices to reduce the spread of sexually transmitted infections (STIs).  You should be screened for STIs, including gonorrhea and chlamydia if:  You are sexually active and are younger than 24 years.  You are older than 24 years, and your health care provider tells you that you are at risk for this type of infection.  Your sexual activity has changed since you were last screened, and you are at an increased risk for chlamydia or gonorrhea. Ask your health care provider if you are at risk.  If you are at risk of being infected with HIV, it is recommended that you take a prescription medicine daily to prevent HIV infection. This is called pre-exposure prophylaxis (PrEP). You are considered at risk if:  You are a man who has sex with  other men (MSM).  You are a heterosexual man who is sexually active with multiple partners.  You take drugs by injection.  You are sexually active with a partner who has HIV.  Talk with your health care provider about whether you are at high risk of being infected with HIV. If you choose to begin PrEP, you should first be tested for HIV. You should then be tested every 3 months for as long as you are taking PrEP.  Use sunscreen. Apply sunscreen liberally and repeatedly throughout the day. You should seek shade when your shadow is shorter than you. Protect yourself by wearing long sleeves, pants, a wide-brimmed hat, and sunglasses year round whenever you are outdoors.  Tell your health care provider of new moles or changes in moles, especially if there is a change in shape or color. Also, tell your health care provider if a mole is larger than the size of a pencil eraser.  A one-time screening for abdominal aortic aneurysm (AAA) and surgical repair of large AAAs by ultrasound is recommended for men aged 26-75 years who are current or former smokers.  Stay current with your vaccines (immunizations).   This information is not intended to replace advice given to you by your health care provider. Make sure you discuss any questions you have with your health care provider.   Document Released: 09/11/2007 Document Revised: 04/05/2014 Document Reviewed: 08/10/2010 Elsevier Interactive Patient Education 2016 [place] for Diabetes Mellitus Carbohydrate counting is a method for keeping track of the amount of carbohydrates you eat. Eating carbohydrates naturally increases the level of sugar (glucose) in your blood, so it is important for you to know the amount that is okay for you to have in every meal. Carbohydrate counting helps keep the level of glucose in your blood within normal limits. The amount of carbohydrates allowed is different for every person. A dietitian  can help you calculate the amount that is right for you. Once you know the amount of carbohydrates you can have, you can count the carbohydrates in the foods you want to eat. Carbohydrates are found in the following foods:  Grains, such as breads and cereals.  Dried beans and soy products.  Starchy vegetables, such as potatoes, peas, and corn.  Fruit and fruit juices.  Milk and yogurt.  Sweets and snack foods, such as cake, cookies, candy, chips, soft drinks, and fruit drinks. CARBOHYDRATE COUNTING There are two ways to count  the carbohydrates in your food. You can use either of the methods or a combination of both. Reading the "Nutrition Facts" on Max The "Nutrition Facts" is an area that is included on the labels of almost all packaged food and beverages in the [place]. It includes the serving size of that food or beverage and information about the nutrients in each serving of the food, including the grams (g) of carbohydrate per serving.  Decide the number of servings of this food or beverage that you will be able to eat or drink. Multiply that number of servings by the number of grams of carbohydrate that is listed on the label for that serving. The total will be the amount of carbohydrates you will be having when you eat or drink this food or beverage. Learning Standard Serving Sizes of Food When you eat food that is not packaged or does not include "Nutrition Facts" on the label, you need to measure the servings in order to count the amount of carbohydrates.A serving of most carbohydrate-rich foods contains about 15 g of carbohydrates. The following list includes serving sizes of carbohydrate-rich foods that provide 15 g ofcarbohydrate per serving:   1 slice of bread (1 oz) or 1 six-inch tortilla.    of a hamburger bun or English muffin.  4-6 crackers.   cup unsweetened dry cereal.    cup hot cereal.   cup rice or pasta.    cup mashed potatoes or  of  a large baked potato.  1 cup fresh fruit or one small piece of fruit.    cup canned or frozen fruit or fruit juice.  1 cup milk.   cup plain fat-free yogurt or yogurt sweetened with artificial sweeteners.   cup cooked dried beans or starchy vegetable, such as peas, corn, or potatoes.  Decide the number of standard-size servings that you will eat. Multiply that number of servings by 15 (the grams of carbohydrates in that serving). For example, if you eat 2 cups of strawberries, you will have eaten 2 servings and 30 g of carbohydrates (2 servings x 15 g = 30 g). For foods such as soups and casseroles, in which more than one food is mixed in, you will need to count the carbohydrates in each food that is included. EXAMPLE OF CARBOHYDRATE COUNTING Sample Dinner  3 oz chicken breast.   cup of brown rice.   cup of corn.  1 cup milk.   1 cup strawberries with sugar-free whipped topping.  Carbohydrate Calculation Step 1: Identify the foods that contain carbohydrates:   Rice.   Corn.   Milk.   Strawberries. Step 2:Calculate the number of servings eaten of each:   2 servings of rice.   1 serving of corn.   1 serving of milk.   1 serving of strawberries. Step 3: Multiply each of those number of servings by 15 g:   2 servings of rice x 15 g = 30 g.   1 serving of corn x 15 g = 15 g.   1 serving of milk x 15 g = 15 g.   1 serving of strawberries x 15 g = 15 g. Step 4: Add together all of the amounts to find the total grams of carbohydrates eaten: 30 g + 15 g + 15 g + 15 g = 75 g.   This information is not intended to replace advice given to you by your health care provider. Make sure you discuss any questions you  have with your health care provider.   Document Released: 03/15/2005 Document Revised: 04/05/2014 Document Reviewed: 02/09/2013 Elsevier Interactive Patient Education [employer].

## 2015-04-14 NOTE — Assessment & Plan Note (Signed)
Encouraged he f/u with dentist.

## 2015-04-14 NOTE — Assessment & Plan Note (Signed)
Advanced directives - previously provided with advanced directive packet

## 2015-04-14 NOTE — Assessment & Plan Note (Signed)
Check TSH today

## 2015-04-18 ENCOUNTER — Encounter: Payer: Self-pay | Admitting: *Deleted

## 2015-04-21 ENCOUNTER — Other Ambulatory Visit: Payer: Self-pay | Admitting: Family Medicine

## 2015-06-10 ENCOUNTER — Other Ambulatory Visit: Payer: Self-pay | Admitting: Family Medicine

## 2015-06-10 ENCOUNTER — Other Ambulatory Visit: Payer: Self-pay | Admitting: Podiatrist

## 2015-06-12 ENCOUNTER — Telehealth: Payer: Self-pay | Admitting: Family Medicine

## 2015-06-12 ENCOUNTER — Ambulatory Visit: Payer: Commercial Managed Care - HMO | Admitting: Family Medicine

## 2015-06-12 NOTE — Telephone Encounter (Signed)
plz call to schedule f/u visit when I return.

## 2015-06-12 NOTE — Telephone Encounter (Signed)
Patient did not come for their scheduled appointment today for 2 month follow up.  Please let me know if the patient needs to be contacted immediately for follow up or if no follow up is necessary.

## 2015-06-16 ENCOUNTER — Other Ambulatory Visit: Payer: Self-pay | Admitting: Family Medicine

## 2015-06-18 NOTE — Telephone Encounter (Signed)
Left message asking pt to call office  °

## 2015-06-20 NOTE — Telephone Encounter (Signed)
Left message asking pt to call office  °

## 2015-06-23 ENCOUNTER — Encounter: Payer: Self-pay | Admitting: Family Medicine

## 2015-06-23 NOTE — Telephone Encounter (Signed)
Mailed letter °

## 2015-07-01 ENCOUNTER — Telehealth: Payer: Self-pay

## 2015-07-01 ENCOUNTER — Telehealth: Payer: Self-pay | Admitting: *Deleted

## 2015-07-01 NOTE — Telephone Encounter (Signed)
[facility] sent fax that Humulin is no longer preferred and needs to be changed to Novolin R. Please send to [facility]. Thanks.

## 2015-07-01 NOTE — Telephone Encounter (Signed)
Kayla from [facility] left v/m; pt ins no longer covers humulin R; ins will cover Novolin R. Kayla request cb. Humulin R on hx med list.

## 2015-07-03 NOTE — Telephone Encounter (Signed)
Please notify pharmacy - pt is no longer taking regular insulin.

## 2015-07-03 NOTE — Telephone Encounter (Signed)
Pharmacy notified.

## 2015-07-04 NOTE — Telephone Encounter (Signed)
See other phone note. This was already taken care of.

## 2015-07-07 NOTE — Telephone Encounter (Signed)
Pt left v/m requesting changing Humulin R to Novolin R due to cost; I left v/m requesting cb.

## 2015-07-07 NOTE — Telephone Encounter (Signed)
plz notify sent in. 

## 2015-07-07 NOTE — Telephone Encounter (Signed)
Pt said that the humulin was stopped and pt was taking levemir but his sugar went to 569 4 or 5 days ago and pt started taking Humulin prn for high blood sugars.After pt restarted the humulin and taking levemir 60 units at breakfast  Blood sugar range is in the 200's. FBS this morning was in the 200's; pt does not remember exact #. Pt watching diet to a certain degree. Insurance will not cover humulin and pt request Novolin R instead. Pt request cb. [facility].

## 2015-07-08 MED ORDER — INSULIN REGULAR HUMAN 100 UNIT/ML IJ SOLN: 10.0000 [IU] | Freq: Two times a day (BID) | INTRAMUSCULAR | Status: DC

## 2015-07-08 NOTE — Addendum Note (Signed)
Addended by: [name] on: 07/08/2015 12:03 AM   Modules accepted: Orders

## 2015-07-08 NOTE — Telephone Encounter (Signed)
Patient advised.

## 2015-07-13 DIAGNOSIS — E109 Type 1 diabetes mellitus without complications: Secondary | ICD-10-CM | POA: Diagnosis not present

## 2015-07-15 ENCOUNTER — Other Ambulatory Visit: Payer: Self-pay

## 2015-07-15 NOTE — Telephone Encounter (Signed)
Kayla at [facility] left v/m requesting refill tamsulosin; pt last annual exam on 04/14/15 and pt was advised to call for urology appt; med last refilled # 30 x 4 on 07/15/14.Please advise.

## 2015-07-16 ENCOUNTER — Other Ambulatory Visit: Payer: Self-pay | Admitting: *Deleted

## 2015-07-16 MED ORDER — TAMSULOSIN HCL 0.4 MG PO CAPS: 0.4000 mg | ORAL_CAPSULE | Freq: Every day | ORAL | Status: DC

## 2015-08-09 ENCOUNTER — Other Ambulatory Visit: Payer: Self-pay | Admitting: Family Medicine

## 2015-10-12 DIAGNOSIS — E109 Type 1 diabetes mellitus without complications: Secondary | ICD-10-CM | POA: Diagnosis not present

## 2015-12-27 ENCOUNTER — Other Ambulatory Visit: Payer: Self-pay | Admitting: Family Medicine

## 2016-01-12 DIAGNOSIS — E109 Type 1 diabetes mellitus without complications: Secondary | ICD-10-CM | POA: Diagnosis not present

## 2016-01-29 ENCOUNTER — Telehealth: Payer: Self-pay | Admitting: *Deleted

## 2016-01-29 NOTE — Telephone Encounter (Signed)
Erika with [facility] calls to ensure that we received forms for supplies request.  Forms have been completed and are in Kim's box to be faxed.

## 2016-01-29 NOTE — Telephone Encounter (Signed)
Filled and in Kim's box. 

## 2016-01-29 NOTE — Telephone Encounter (Signed)
Form for diabetes supplies in your IN box for completion.

## 2016-01-30 NOTE — Telephone Encounter (Signed)
Form faxed

## 2016-03-02 ENCOUNTER — Other Ambulatory Visit: Payer: Self-pay | Admitting: Family Medicine

## 2016-03-12 ENCOUNTER — Telehealth: Payer: Self-pay

## 2016-03-12 NOTE — Telephone Encounter (Signed)
Pt left v/m requesting refill Novolin R; pt already has available refills; pt voiced understanding. Pt also said his ex wife told him she had called [facility] and advised not to give pt any med without an appt. 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 FY to Dr Darnell Level.

## 2016-03-13 NOTE — Telephone Encounter (Signed)
Noted. I don't see where wife called. However, regardless he is due for f/u DM visit - plz call and schedule this, or if pt declines then schedule CPE for after 04/14/2016

## 2016-03-17 ENCOUNTER — Encounter (HOSPITAL_COMMUNITY): Payer: Self-pay | Admitting: Emergency Medicine

## 2016-03-17 ENCOUNTER — Emergency Department (HOSPITAL_COMMUNITY): Payer: Commercial Managed Care - HMO

## 2016-03-17 ENCOUNTER — Inpatient Hospital Stay (HOSPITAL_COMMUNITY)
Admission: EM | Admit: 2016-03-17 | Discharge: 2016-03-24 | DRG: 871 | Disposition: A | Discharge status: Skilled Nursing Facility | Payer: Commercial Managed Care - HMO | Attending: Nephrology | Admitting: Nephrology

## 2016-03-17 DIAGNOSIS — I959 Hypotension, unspecified: Secondary | ICD-10-CM | POA: Diagnosis present

## 2016-03-17 DIAGNOSIS — R600 Localized edema: Secondary | ICD-10-CM

## 2016-03-17 DIAGNOSIS — R6520 Severe sepsis without septic shock: Secondary | ICD-10-CM | POA: Diagnosis present

## 2016-03-17 DIAGNOSIS — N179 Acute kidney failure, unspecified: Secondary | ICD-10-CM | POA: Diagnosis present

## 2016-03-17 DIAGNOSIS — R748 Abnormal levels of other serum enzymes: Secondary | ICD-10-CM | POA: Diagnosis not present

## 2016-03-17 DIAGNOSIS — R739 Hyperglycemia, unspecified: Secondary | ICD-10-CM

## 2016-03-17 DIAGNOSIS — R404 Transient alteration of awareness: Secondary | ICD-10-CM | POA: Diagnosis not present

## 2016-03-17 DIAGNOSIS — I517 Cardiomegaly: Secondary | ICD-10-CM | POA: Diagnosis present

## 2016-03-17 DIAGNOSIS — I10 Essential (primary) hypertension: Secondary | ICD-10-CM | POA: Diagnosis not present

## 2016-03-17 DIAGNOSIS — E039 Hypothyroidism, unspecified: Secondary | ICD-10-CM | POA: Diagnosis present

## 2016-03-17 DIAGNOSIS — A498 Other bacterial infections of unspecified site: Secondary | ICD-10-CM

## 2016-03-17 DIAGNOSIS — E1100 Type 2 diabetes mellitus with hyperosmolarity without nonketotic hyperglycemic-hyperosmolar coma (NKHHC): Secondary | ICD-10-CM | POA: Diagnosis not present

## 2016-03-17 DIAGNOSIS — Z9111 Patient's noncompliance with dietary regimen: Secondary | ICD-10-CM | POA: Diagnosis not present

## 2016-03-17 DIAGNOSIS — E876 Hypokalemia: Secondary | ICD-10-CM | POA: Diagnosis not present

## 2016-03-17 DIAGNOSIS — A419 Sepsis, unspecified organism: Secondary | ICD-10-CM | POA: Diagnosis not present

## 2016-03-17 DIAGNOSIS — I248 Other forms of acute ischemic heart disease: Secondary | ICD-10-CM | POA: Diagnosis present

## 2016-03-17 DIAGNOSIS — I890 Lymphedema, not elsewhere classified: Secondary | ICD-10-CM | POA: Diagnosis present

## 2016-03-17 DIAGNOSIS — Z794 Long term (current) use of insulin: Secondary | ICD-10-CM | POA: Diagnosis not present

## 2016-03-17 DIAGNOSIS — I872 Venous insufficiency (chronic) (peripheral): Secondary | ICD-10-CM | POA: Diagnosis present

## 2016-03-17 DIAGNOSIS — Z8546 Personal history of malignant neoplasm of prostate: Secondary | ICD-10-CM | POA: Diagnosis not present

## 2016-03-17 DIAGNOSIS — Z6841 Body Mass Index (BMI) 40.0 and over, adult: Secondary | ICD-10-CM

## 2016-03-17 DIAGNOSIS — B952 Enterococcus as the cause of diseases classified elsewhere: Secondary | ICD-10-CM | POA: Diagnosis present

## 2016-03-17 DIAGNOSIS — R4789 Other speech disturbances: Secondary | ICD-10-CM

## 2016-03-17 DIAGNOSIS — E784 Other hyperlipidemia: Secondary | ICD-10-CM | POA: Diagnosis not present

## 2016-03-17 DIAGNOSIS — IMO0002 Reserved for concepts with insufficient information to code with codable children: Secondary | ICD-10-CM | POA: Diagnosis present

## 2016-03-17 DIAGNOSIS — E860 Dehydration: Secondary | ICD-10-CM

## 2016-03-17 DIAGNOSIS — Z7982 Long term (current) use of aspirin: Secondary | ICD-10-CM | POA: Diagnosis not present

## 2016-03-17 DIAGNOSIS — G9341 Metabolic encephalopathy: Secondary | ICD-10-CM | POA: Diagnosis present

## 2016-03-17 DIAGNOSIS — E1165 Type 2 diabetes mellitus with hyperglycemia: Secondary | ICD-10-CM | POA: Diagnosis present

## 2016-03-17 DIAGNOSIS — N390 Urinary tract infection, site not specified: Secondary | ICD-10-CM | POA: Diagnosis not present

## 2016-03-17 DIAGNOSIS — Z79899 Other long term (current) drug therapy: Secondary | ICD-10-CM

## 2016-03-17 DIAGNOSIS — I878 Other specified disorders of veins: Secondary | ICD-10-CM | POA: Diagnosis present

## 2016-03-17 DIAGNOSIS — R7309 Other abnormal glucose: Secondary | ICD-10-CM | POA: Diagnosis not present

## 2016-03-17 DIAGNOSIS — E118 Type 2 diabetes mellitus with unspecified complications: Secondary | ICD-10-CM

## 2016-03-17 DIAGNOSIS — N3001 Acute cystitis with hematuria: Secondary | ICD-10-CM

## 2016-03-17 DIAGNOSIS — R41841 Cognitive communication deficit: Secondary | ICD-10-CM | POA: Diagnosis not present

## 2016-03-17 DIAGNOSIS — M6281 Muscle weakness (generalized): Secondary | ICD-10-CM | POA: Diagnosis not present

## 2016-03-17 DIAGNOSIS — B9689 Other specified bacterial agents as the cause of diseases classified elsewhere: Secondary | ICD-10-CM | POA: Diagnosis not present

## 2016-03-17 DIAGNOSIS — Z87891 Personal history of nicotine dependence: Secondary | ICD-10-CM | POA: Diagnosis not present

## 2016-03-17 DIAGNOSIS — E038 Other specified hypothyroidism: Secondary | ICD-10-CM | POA: Diagnosis present

## 2016-03-17 DIAGNOSIS — R4182 Altered mental status, unspecified: Secondary | ICD-10-CM | POA: Diagnosis not present

## 2016-03-17 DIAGNOSIS — R319 Hematuria, unspecified: Secondary | ICD-10-CM

## 2016-03-17 DIAGNOSIS — Z91148 Patient's other noncompliance with medication regimen for other reason: Secondary | ICD-10-CM

## 2016-03-17 DIAGNOSIS — E785 Hyperlipidemia, unspecified: Secondary | ICD-10-CM | POA: Diagnosis present

## 2016-03-17 DIAGNOSIS — R410 Disorientation, unspecified: Secondary | ICD-10-CM | POA: Diagnosis present

## 2016-03-17 DIAGNOSIS — Z9114 Patient's other noncompliance with medication regimen: Secondary | ICD-10-CM

## 2016-03-17 DIAGNOSIS — R609 Edema, unspecified: Secondary | ICD-10-CM | POA: Diagnosis not present

## 2016-03-17 DIAGNOSIS — A4159 Other Gram-negative sepsis: Principal | ICD-10-CM | POA: Diagnosis present

## 2016-03-17 DIAGNOSIS — K5900 Constipation, unspecified: Secondary | ICD-10-CM | POA: Diagnosis not present

## 2016-03-17 LAB — URINALYSIS, ROUTINE W REFLEX MICROSCOPIC
Bilirubin Urine: NEGATIVE
Glucose, UA: >=500 mg/dL — AB
KETONES UR: 5 mg/dL — AB
NITRITE: NEGATIVE
PH: 5 (ref 5.0–8.0)
Protein, ur: 30 mg/dL — AB
Specific Gravity, Urine: 1.016 (ref 1.005–1.030)

## 2016-03-17 LAB — CBC WITH DIFFERENTIAL/PLATELET
BASOS ABS: 0 10*3/uL (ref 0.0–0.1)
Basophils Relative: 0 %
EOS PCT: 0 %
Eosinophils Absolute: 0 10*3/uL (ref 0.0–0.7)
HCT: 44.9 % (ref 39.0–52.0)
HEMOGLOBIN: 15 g/dL (ref 13.0–17.0)
LYMPHS PCT: 5 %
Lymphs Abs: 0.8 10*3/uL (ref 0.7–4.0)
MCH: 30.3 pg (ref 26.0–34.0)
MCHC: 33.4 g/dL (ref 30.0–36.0)
MCV: 90.7 fL (ref 78.0–100.0)
Monocytes Absolute: 0.7 10*3/uL (ref 0.1–1.0)
Monocytes Relative: 4 %
NEUTROS PCT: 91 %
Neutro Abs: 15.5 10*3/uL — ABNORMAL HIGH (ref 1.7–7.7)
PLATELETS: 150 10*3/uL (ref 150–400)
RBC: 4.95 MIL/uL (ref 4.22–5.81)
RDW: 14.2 % (ref 11.5–15.5)
WBC: 17.1 10*3/uL — AB (ref 4.0–10.5)

## 2016-03-17 LAB — COMPREHENSIVE METABOLIC PANEL
ALT: 32 U/L (ref 17–63)
AST: 41 U/L (ref 15–41)
Albumin: 3.4 g/dL — ABNORMAL LOW (ref 3.5–5.0)
Alkaline Phosphatase: 72 U/L (ref 38–126)
Anion gap: 13 (ref 5–15)
BUN: 10 mg/dL (ref 6–20)
CHLORIDE: 102 mmol/L (ref 101–111)
CO2: 23 mmol/L (ref 22–32)
CREATININE: 1.65 mg/dL — AB (ref 0.61–1.24)
Calcium: 9.6 mg/dL (ref 8.9–10.3)
GFR calc Af Amer: 46 mL/min — ABNORMAL LOW (ref >60)
GFR calc non Af Amer: 39 mL/min — ABNORMAL LOW (ref >60)
Glucose, Bld: 282 mg/dL — ABNORMAL HIGH (ref 65–99)
Potassium: 4.6 mmol/L (ref 3.5–5.1)
SODIUM: 138 mmol/L (ref 135–145)
Total Bilirubin: 1.7 mg/dL — ABNORMAL HIGH (ref 0.3–1.2)
Total Protein: 7.2 g/dL (ref 6.5–8.1)

## 2016-03-17 LAB — RAPID URINE DRUG SCREEN, HOSP PERFORMED
Amphetamines: NOT DETECTED
Barbiturates: NOT DETECTED
Benzodiazepines: NOT DETECTED
Cocaine: NOT DETECTED
Opiates: NOT DETECTED
TETRAHYDROCANNABINOL: NOT DETECTED

## 2016-03-17 LAB — ETHANOL

## 2016-03-17 LAB — I-STAT CG4 LACTIC ACID, ED: Lactic Acid, Venous: 6.1 mmol/L (ref 0.5–1.9)

## 2016-03-17 MED ORDER — DEXTROSE 5 % IV SOLN
2.0000 g | Freq: Once | INTRAVENOUS | Status: AC
  Administered 2016-03-17: 2 g via INTRAVENOUS
  Filled 2016-03-17: qty 2

## 2016-03-17 MED ORDER — SODIUM CHLORIDE 0.9 % IV BOLUS (SEPSIS)
1000.0000 mL | Freq: Once | INTRAVENOUS | Status: AC
  Administered 2016-03-17: 1000 mL via INTRAVENOUS

## 2016-03-17 NOTE — ED Triage Notes (Signed)
Per [facility]: Pt to ED from home after pt's daughters called EMS d/t pt "not acting his normal self." Per EMS, daughters state that he is normally able to walk around, take own medications and is A&O. Last known well was last night and pt is currently only alerted to self. He was unable to stand with EMS, daughters also state that his lower legs are more swollen than normal. EMS VS: 97/54, HR 140 ST with RBBB, 96% RA, CBG 306. Resp e/u at this time.

## 2016-03-17 NOTE — ED Provider Notes (Signed)
[facility] DEPT Provider Note   CSN: [id number] Arrival date & time: 03/17/16  2023     History   Chief Complaint Chief Complaint  Patient presents with  . Altered Mental Status    HPI Quanta D Viars is a 75 y.o. male.  Pt presents to the ED today with altered mental status.  Pt's daughters called EMS and said he was not his normal self.  Pt was last seen normal this morning.  The pt said that he is normally able to walk around and is alert and oriented.  He is unable to stand with EMS.  He said that he did not take his meds, but it is unclear how long it has been since he took them.  Pt denies any pain.  Pt's daughter is here and gives more history.  She said patient lives at home by himself.  She checks on him frequently.  She said that he has not been taking his medications right.  He has also cancelled his last 2 doctor's visits because he has not wanted to see his doctor.      Past Medical History:  Diagnosis Date  . Colon polyps 04/2010   tubular adenomas, rpt colonoscopy 3 years [facility] [place])  . Hepatitis 1961   when in service, thinks viral  . History of prostate cancer    tx seed implants in [place] now followed by Dr. Gaynelle Arabian with yearly PSAs  . HLD (hyperlipidemia)   . HTN (hypertension)   . Hypothyroid   . Lymphedema 05/10/2013   Lymphedema pump per [facility] Dr Delana Meyer   . Obesity   . T2DM (type 2 diabetes mellitus) ([place])    underwent DMSE remotely  . Vitamin D deficiency     Patient Active Problem List   Diagnosis Date Noted  . Cardiomegaly 03/18/2016  . Sepsis ([place]) 03/17/2016  . UTI (urinary tract infection) 03/17/2016  . Acute sinusitis 04/14/2015  . Advanced care planning/counseling discussion 01/08/2014  . Lumbago 01/08/2014  . Lymphedema 05/10/2013  . Lipoma 09/19/2012  . Medicare annual wellness visit, subsequent 12/06/2011  . Morbid obesity with BMI of 50.0-59.9, adult ([place])   . Hypothyroidism 09/17/2010  . Diabetes type 2, uncontrolled ([place])  04/13/2010  . HLD (hyperlipidemia) 04/13/2010  . 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 HTN (hypertension) 04/13/2010  . Dental caries 04/13/2010  . LEG EDEMA, BILATERAL 04/13/2010  . PERS HX NONCOMPLIANCE W/MED TX PRS HAZARDS HLTH 04/13/2010    Past Surgical History:  Procedure Laterality Date  . COLONOSCOPY  2012   tubular adenoma mult polyps, int/ext hemorrhoids, diverticula, rpt 3 yeras  . goiter removed  2002   on calcium ever since  . INSERTION PROSTATE RADIATION SEED  2003  . [place]  . US ECHOCARDIOGRAPHY  03/2013   Mild LVH and diastolic dysfunction, mild LA dilation, EF 55-60%, could not measure PA pressure.       Home Medications    Prior to Admission medications   Medication Sig Start Date End Date Taking? Authorizing Provider  acetaminophen (TYLENOL) 650 MG CR tablet Take 650 mg by mouth every 8 (eight) hours as needed for pain.    Yes Historical Provider, MD  ammonium lactate (AMLACTIN) 12 % cream APPLY TOPICALLY AS NEEDED. 12/29/15  Yes [name], MD  aspirin 325 MG tablet Take 325 mg by mouth daily.   Yes Historical Provider, MD  atorvastatin (LIPITOR) 40 MG tablet Take one tablet daily **MUST HAVE PHYSICAL FOR FURTHER REFILLS**  01/14/15  Yes [name], MD  calcium-vitamin D (OSCAL WITH D) 500-200 MG-UNIT per tablet Take 1 tablet by mouth daily.   Yes Historical Provider, MD  clotrimazole (LOTRIMIN) 1 % cream APPLY TOPICALLY TWICE A DAY TO INTERDIGITAL WEB SPACES 08/19/14  Yes [name], MD  clotrimazole (LOTRIMIN) 1 % cream APPLY 1 APPLICATION TOPICALLY 2 (TWO) TIMES DAILY. INTERDIGITAL WEB SPACES 12/29/15  Yes [name], MD  furosemide (LASIX) 20 MG tablet Take 1 tablet (20 mg total) by mouth 2 (two) times daily as needed. 04/14/15  Yes [name], MD  glucose 4 GM chewable tablet Chew 1 tablet (4 g total) by mouth as needed for low blood sugar. 04/14/15  Yes [name], MD  Insulin Detemir  (LEVEMIR FLEXTOUCH) 100 UNIT/ML Pen Inject 60 Units into the skin daily with breakfast. 04/21/15  Yes [name], MD  levothyroxine (SYNTHROID, LEVOTHROID) 200 MCG tablet TAKE 1 TABLET (200 MCG TOTAL) BY MOUTH DAILY. 07/22/14  Yes [name], MD  loperamide (IMODIUM A-D) 2 MG tablet Take 2 mg by mouth as needed.   Yes Historical Provider, MD  metFORMIN (GLUCOPHAGE) 1000 MG tablet TAKE 1 TABLET BY MOUTH TWICE A DAY WITH MEALS 06/17/15  Yes [name], MD  metoprolol (LOPRESSOR) 100 MG tablet TAKE 1 TABLET BY MOUTH 2 TIMES DAILY. 07/22/14  Yes [name], MD  Multiple Vitamin (MULTIVITAMIN) capsule Take 1 capsule by mouth daily.   Yes Historical Provider, MD  NOVOLIN R 100 UNIT/ML injection INJECT 0.1 MLS (10 UNITS TOTAL) INTO THE SKIN 2 (TWO) TIMES DAILY BEFORE A MEAL. 03/03/16  Yes [name], MD  Omega-3 Fatty Acids (OMEGA 3 PO) Take 2 capsules by mouth daily. Reported on 04/14/2015   Yes Historical Provider, MD  quinapril (ACCUPRIL) 20 MG tablet TAKE 1 TABLET BY MOUTH TWICE A DAY 08/18/13  Yes [name], MD  spironolactone (ALDACTONE) 25 MG tablet TAKE 1 TABLET BY MOUTH 2 TIMES A DAY 07/02/13  Yes [name], MD  tamsulosin (FLOMAX) 0.4 MG CAPS capsule TAKE ONE CAPSULE BY MOUTH EVERY DAY 08/11/15  Yes [name], MD  urea (CARMOL) 40 % CREA APPLY TO BOTTOMS OF HEELS EVERY DAY AS NEEDED FOR CRACKING HEELS 06/11/15  Yes Wallene Huh, DPM  tamsulosin (FLOMAX) 0.4 MG CAPS capsule Take 1 capsule (0.4 mg total) by mouth daily. Patient not taking: Reported on 03/17/2016 07/16/15   [name], MD    Family History Family History  Problem Relation Age of Onset  . Coronary artery disease Mother 16  . Prostate cancer Brother   . Diabetes Brother   . Stroke Neg Hx     Social History Social History  Substance Use Topics  . Smoking status: Former Smoker    Quit date: 01/27/1985  . Smokeless tobacco: Never Used  . Alcohol use No     Allergies     Patient has no known allergies.   Review of Systems Review of Systems  Unable to perform ROS: Mental status change  All other systems reviewed and are negative.    Physical Exam Updated Vital Signs BP 103/77   Pulse (!) 113   Temp 98.4 F (36.9 C)   Resp 18   Ht 6' (1.829 m)   Wt (!) 453 lb (205.5 kg)   SpO2 96%   BMI 61.44 kg/m   Physical Exam  Constitutional: He appears well-developed and well-nourished.  HENT:  Head: Normocephalic and atraumatic.  Right Ear: External ear normal.  Left Ear: External ear  normal.  Nose: Nose normal.  Mouth/Throat: Oropharynx is clear and moist.  Eyes: Conjunctivae and EOM are normal. Pupils are equal, round, and reactive to light.  Neck: Normal range of motion. Neck supple.  Cardiovascular: Normal rate, regular rhythm, normal heart sounds and intact distal pulses.   Pulmonary/Chest: Effort normal and breath sounds normal.  Abdominal: Soft. Bowel sounds are normal.  Musculoskeletal: He exhibits edema.  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  Pt knows his name.  He does not know where he is.  He does not know that Christmas is coming up.  Pt is following commands.  He is moving all 4 extremities.  Nursing note and vitals reviewed.    ED Treatments / Results  Labs (all labs ordered are listed, but only abnormal results are displayed) Labs Reviewed  CBC WITH DIFFERENTIAL/PLATELET - Abnormal; Notable for the following:       Result Value   WBC 17.1 (*)    Neutro Abs 15.5 (*)    All other components within normal limits  COMPREHENSIVE METABOLIC PANEL - Abnormal; Notable for the following:    Glucose, Bld 282 (*)    Creatinine, Ser 1.65 (*)    Albumin 3.4 (*)    Total Bilirubin 1.7 (*)    GFR calc non Af Amer 39 (*)    GFR calc Af Amer 46 (*)    All other components within normal limits  URINALYSIS, ROUTINE W REFLEX MICROSCOPIC - Abnormal; Notable for the following:    Color, Urine AMBER (*)     APPearance HAZY (*)    Glucose, UA >=500 (*)    Hgb urine dipstick LARGE (*)    Ketones, ur 5 (*)    Protein, ur 30 (*)    Leukocytes, UA MODERATE (*)    Bacteria, UA RARE (*)    Squamous Epithelial / LPF 0-5 (*)    All other components within normal limits  LACTIC ACID, PLASMA - Abnormal; Notable for the following:    Lactic Acid, Venous 3.9 (*)    All other components within normal limits  MAGNESIUM - Abnormal; Notable for the following:    Magnesium 1.4 (*)    All other components within normal limits  COMPREHENSIVE METABOLIC PANEL - Abnormal; Notable for the following:    Glucose, Bld 270 (*)    Creatinine, Ser 1.27 (*)    Calcium 8.7 (*)    Total Protein 6.2 (*)    Albumin 2.8 (*)    GFR calc non Af Amer 54 (*)    All other components within normal limits  CBC - Abnormal; Notable for the following:    WBC 17.1 (*)    Hemoglobin 12.9 (*)    HCT 38.0 (*)    Platelets 148 (*)    All other components within normal limits  TROPONIN I - Abnormal; Notable for the following:    Troponin I 0.03 (*)    All other components within normal limits  LACTIC ACID, PLASMA - Abnormal; Notable for the following:    Lactic Acid, Venous 3.4 (*)    All other components within normal limits  LACTIC ACID, PLASMA - Abnormal; Notable for the following:    Lactic Acid, Venous 3.5 (*)    All other components within normal limits  GLUCOSE, CAPILLARY - Abnormal; Notable for the following:    Glucose-Capillary 214 (*)    All other components within normal limits  GLUCOSE, CAPILLARY - Abnormal; Notable for the following:  Glucose-Capillary 222 (*)    All other components within normal limits  GLUCOSE, CAPILLARY - Abnormal; Notable for the following:    Glucose-Capillary 186 (*)    All other components within normal limits  I-STAT CG4 LACTIC ACID, ED - Abnormal; Notable for the following:    Lactic Acid, Venous 6.10 (*)    All other components within normal limits  I-STAT CG4 LACTIC ACID, ED -  Abnormal; Notable for the following:    Lactic Acid, Venous 5.10 (*)    All other components within normal limits  CULTURE, BLOOD (ROUTINE X 2)  CULTURE, BLOOD (ROUTINE X 2)  RESPIRATORY PANEL BY PCR  MRSA PCR SCREENING  URINE CULTURE  RAPID URINE DRUG SCREEN, HOSP PERFORMED  ETHANOL  CREATININE, URINE, RANDOM  SODIUM, URINE, RANDOM  PROCALCITONIN  PROTIME-INR  APTT  PHOSPHORUS  TSH  TROPONIN I  HEMOGLOBIN A1C  TROPONIN I  TROPONIN I  TROPONIN I  I-STAT TROPOININ, ED    EKG  EKG Interpretation  Date/Time:  Wednesday March 17 2016 23:51:14 EST Ventricular Rate:  126 PR Interval:    QRS Duration: 150 QT Interval:  330 QTC Calculation: 478 R Axis:   -89 Text Interpretation:  Fast sinus arrhythmia RBBB and LAFB Inferior infarct, acute Lateral leads are also involved Confirmed by [facility] MD, Absalom Aro ([id number]) on 03/17/2016 11:55:10 PM Also confirmed by Gilford Raid MD, Zilah Villaflor ([id number]), editor [place]  CCT, BEVERLY ([id number])  on 03/18/2016 11:06:04 AM       Radiology Dg Chest 2 View  Result Date: 03/17/2016 CLINICAL DATA:  Initial e acute altered mental status. Valuation for EXAM: CHEST  2 VIEW COMPARISON:  None available. FINDINGS: Mild to moderate cardiomegaly. Mediastinal silhouette within normal limits. Lungs are mildly hypoinflated. There is diffuse vascular congestion with interstitial prominence, suggesting mild pulmonary interstitial edema. No definite pleural effusion, although evaluation somewhat limited by patient body habitus. No consolidative airspace disease. No pneumothorax. No acute osseous abnormality. IMPRESSION: Cardiomegaly with diffuse pulmonary vascular congestion and interstitial prominence, suggesting mild diffuse pulmonary interstitial edema. Electronically Signed   By: Jeannine Boga M.D.   On: 03/17/2016 21:27   Ct Head Wo Contrast  Result Date: 03/17/2016 CLINICAL DATA:  Altered mental status EXAM: CT HEAD WITHOUT CONTRAST TECHNIQUE: Contiguous  axial images were obtained from the base of the skull through the vertex without intravenous contrast. COMPARISON:  None. FINDINGS: Brain: No mass lesion, intraparenchymal hemorrhage or extra-axial collection. No evidence of acute cortical infarct. There is moderate atrophy. There is periventricular hypoattenuation compatible with chronic microvascular disease. Vascular: No hyperdense vessel or unexpected calcification. Skull: Normal visualized skull base, calvarium and extracranial soft tissues. Sinuses/Orbits: No sinus fluid levels or advanced mucosal thickening. No mastoid effusion. Normal orbits. IMPRESSION: 1. No acute intracranial abnormality. 2. Moderate atrophy and chronic microvascular ischemia. Electronically Signed   By: Ulyses Jarred M.D.   On: 03/17/2016 21:47   US Renal  Result Date: 03/18/2016 CLINICAL DATA:  75 year old male with hematuria, UTI, and sepsis. EXAM: RENAL / URINARY TRACT ULTRASOUND COMPLETE COMPARISON:  None. FINDINGS: Evaluation of this exam is limited due to patient's body habitus. Right Kidney: Length: 12.1 cm.  No hydronephrosis or echogenic stone. Left Kidney: Length: 12.1 cm.  No hydronephrosis or echogenic stone. Bladder: Appears normal for degree of bladder distention. IMPRESSION: Unremarkable renal ultrasound on limited evaluation. Electronically Signed   By: Anner Crete M.D.   On: 03/18/2016 04:38    Procedures Procedures (including critical care time)  Medications Ordered in ED Medications  tamsulosin (FLOMAX) capsule 0.4 mg (0.4 mg Oral Given 03/18/16 0826)  insulin detemir (LEVEMIR) injection 60 Units (60 Units Subcutaneous Given 03/18/16 1303)  atorvastatin (LIPITOR) tablet 40 mg (not administered)  levothyroxine (SYNTHROID, LEVOTHROID) tablet 200 mcg (200 mcg Oral Given 03/18/16 0826)  aspirin tablet 325 mg (325 mg Oral Given 03/18/16 0826)  sodium chloride flush (NS) 0.9 % injection 3 mL (3 mLs Intravenous Not Given 03/18/16 1000)  acetaminophen  (TYLENOL) tablet 650 mg (not administered)    Or  acetaminophen (TYLENOL) suppository 650 mg (not administered)  HYDROcodone-acetaminophen (NORCO/VICODIN) 5-325 MG per tablet 1-2 tablet (not administered)  ondansetron (ZOFRAN) tablet 4 mg (not administered)    Or  ondansetron (ZOFRAN) injection 4 mg (not administered)  insulin aspart (novoLOG) injection 0-9 Units (2 Units Subcutaneous Given 03/18/16 1550)  heparin injection 5,000 Units (5,000 Units Subcutaneous Given 03/18/16 1302)  0.9 %  sodium chloride infusion ( Intravenous Stopped 03/18/16 1030)  bisacodyl (DULCOLAX) suppository 10 mg (not administered)  senna (SENOKOT) tablet 8.6 mg (8.6 mg Oral Given 03/18/16 0825)  levalbuterol (XOPENEX) nebulizer solution 0.63 mg (not administered)  guaiFENesin (MUCINEX) 12 hr tablet 600 mg (600 mg Oral Given 03/18/16 0826)  cefTRIAXone (ROCEPHIN) 2 g in dextrose 5 % 50 mL IVPB (not administered)  ipratropium (ATROVENT) nebulizer solution 0.5 mg (not administered)  perflutren lipid microspheres (DEFINITY) IV suspension (4 mLs Intravenous Given 03/18/16 1402)  sodium chloride 0.9 % bolus 1,000 mL (0 mLs Intravenous Stopped 03/18/16 0006)  sodium chloride 0.9 % bolus 1,000 mL (0 mLs Intravenous Stopped 03/18/16 0107)  cefTRIAXone (ROCEPHIN) 2 g in dextrose 5 % 50 mL IVPB (0 g Intravenous Stopped 03/18/16 0018)     Initial Impression / Assessment and Plan / ED Course  I have reviewed the triage vital signs and the nursing notes.  Pertinent labs & imaging results that were available during my care of the patient were reviewed by me and considered in my medical decision making (see chart for details).  Clinical Course     Pt given IVFS, tylenol, rocephin.  He is getting less confused with fluid treatment.  CODE sepsis called due to fever, tachycardia, elevated wbc.  Pt given additional IVFs for blood pressure.  Pt d/w Dr. Roel Cluck (triad) for admission.  Final Clinical Impressions(s) / ED  Diagnoses   Final diagnoses:  Metabolic encephalopathy  Acute cystitis with hematuria  Dehydration  Hyperglycemia  Peripheral edema  Noncompliance with medications  Chronic venous stasis dermatitis  Sepsis, due to unspecified organism [facility])  Hematuria  Word finding difficulty    New Prescriptions Current Discharge Medication List       Isla Pence, MD 03/18/16 [phone]

## 2016-03-17 NOTE — ED Notes (Signed)
Pt transported to x-ray, to go to CT afterward.

## 2016-03-17 NOTE — Telephone Encounter (Signed)
Appt already on schedule for 1/18

## 2016-03-18 ENCOUNTER — Inpatient Hospital Stay (HOSPITAL_COMMUNITY): Payer: Commercial Managed Care - HMO

## 2016-03-18 ENCOUNTER — Encounter (HOSPITAL_COMMUNITY): Payer: Self-pay

## 2016-03-18 DIAGNOSIS — G9341 Metabolic encephalopathy: Secondary | ICD-10-CM | POA: Diagnosis present

## 2016-03-18 DIAGNOSIS — I517 Cardiomegaly: Secondary | ICD-10-CM | POA: Diagnosis present

## 2016-03-18 DIAGNOSIS — R6520 Severe sepsis without septic shock: Secondary | ICD-10-CM | POA: Diagnosis present

## 2016-03-18 DIAGNOSIS — N390 Urinary tract infection, site not specified: Secondary | ICD-10-CM

## 2016-03-18 DIAGNOSIS — R410 Disorientation, unspecified: Secondary | ICD-10-CM | POA: Diagnosis present

## 2016-03-18 DIAGNOSIS — Z6841 Body Mass Index (BMI) 40.0 and over, adult: Secondary | ICD-10-CM

## 2016-03-18 DIAGNOSIS — Z7982 Long term (current) use of aspirin: Secondary | ICD-10-CM | POA: Diagnosis not present

## 2016-03-18 DIAGNOSIS — I890 Lymphedema, not elsewhere classified: Secondary | ICD-10-CM | POA: Diagnosis present

## 2016-03-18 DIAGNOSIS — R319 Hematuria, unspecified: Secondary | ICD-10-CM

## 2016-03-18 DIAGNOSIS — Z9111 Patient's noncompliance with dietary regimen: Secondary | ICD-10-CM | POA: Diagnosis not present

## 2016-03-18 DIAGNOSIS — E038 Other specified hypothyroidism: Secondary | ICD-10-CM | POA: Diagnosis present

## 2016-03-18 DIAGNOSIS — R609 Edema, unspecified: Secondary | ICD-10-CM | POA: Diagnosis not present

## 2016-03-18 DIAGNOSIS — E1100 Type 2 diabetes mellitus with hyperosmolarity without nonketotic hyperglycemic-hyperosmolar coma (NKHHC): Secondary | ICD-10-CM | POA: Diagnosis not present

## 2016-03-18 DIAGNOSIS — A419 Sepsis, unspecified organism: Secondary | ICD-10-CM | POA: Diagnosis not present

## 2016-03-18 DIAGNOSIS — I10 Essential (primary) hypertension: Secondary | ICD-10-CM | POA: Diagnosis not present

## 2016-03-18 DIAGNOSIS — B9689 Other specified bacterial agents as the cause of diseases classified elsewhere: Secondary | ICD-10-CM | POA: Diagnosis not present

## 2016-03-18 DIAGNOSIS — E1165 Type 2 diabetes mellitus with hyperglycemia: Secondary | ICD-10-CM | POA: Diagnosis present

## 2016-03-18 DIAGNOSIS — N179 Acute kidney failure, unspecified: Secondary | ICD-10-CM | POA: Diagnosis present

## 2016-03-18 DIAGNOSIS — Z794 Long term (current) use of insulin: Secondary | ICD-10-CM | POA: Diagnosis not present

## 2016-03-18 DIAGNOSIS — A4159 Other Gram-negative sepsis: Secondary | ICD-10-CM | POA: Diagnosis present

## 2016-03-18 DIAGNOSIS — E785 Hyperlipidemia, unspecified: Secondary | ICD-10-CM

## 2016-03-18 DIAGNOSIS — R748 Abnormal levels of other serum enzymes: Secondary | ICD-10-CM | POA: Diagnosis not present

## 2016-03-18 DIAGNOSIS — I872 Venous insufficiency (chronic) (peripheral): Secondary | ICD-10-CM | POA: Diagnosis present

## 2016-03-18 DIAGNOSIS — E039 Hypothyroidism, unspecified: Secondary | ICD-10-CM

## 2016-03-18 DIAGNOSIS — Z8546 Personal history of malignant neoplasm of prostate: Secondary | ICD-10-CM | POA: Diagnosis not present

## 2016-03-18 DIAGNOSIS — Z79899 Other long term (current) drug therapy: Secondary | ICD-10-CM | POA: Diagnosis not present

## 2016-03-18 DIAGNOSIS — E118 Type 2 diabetes mellitus with unspecified complications: Secondary | ICD-10-CM

## 2016-03-18 DIAGNOSIS — N3001 Acute cystitis with hematuria: Secondary | ICD-10-CM | POA: Diagnosis present

## 2016-03-18 DIAGNOSIS — Z9114 Patient's other noncompliance with medication regimen: Secondary | ICD-10-CM | POA: Diagnosis not present

## 2016-03-18 DIAGNOSIS — I248 Other forms of acute ischemic heart disease: Secondary | ICD-10-CM | POA: Diagnosis present

## 2016-03-18 DIAGNOSIS — Z87891 Personal history of nicotine dependence: Secondary | ICD-10-CM | POA: Diagnosis not present

## 2016-03-18 DIAGNOSIS — I878 Other specified disorders of veins: Secondary | ICD-10-CM | POA: Diagnosis present

## 2016-03-18 DIAGNOSIS — I959 Hypotension, unspecified: Secondary | ICD-10-CM | POA: Diagnosis present

## 2016-03-18 LAB — TROPONIN I
TROPONIN I: 0.03 ng/mL — AB (ref <0.03)
TROPONIN I: 0.05 ng/mL — AB (ref <0.03)
TROPONIN I: 0.03 ng/mL — AB (ref <0.03)

## 2016-03-18 LAB — RESPIRATORY PANEL BY PCR
Adenovirus: NOT DETECTED
BORDETELLA PERTUSSIS-RVPCR: NOT DETECTED
CORONAVIRUS 229E-RVPPCR: NOT DETECTED
CORONAVIRUS OC43-RVPPCR: NOT DETECTED
Chlamydophila pneumoniae: NOT DETECTED
Coronavirus HKU1: NOT DETECTED
Coronavirus NL63: NOT DETECTED
INFLUENZA A-RVPPCR: NOT DETECTED
INFLUENZA B-RVPPCR: NOT DETECTED
METAPNEUMOVIRUS-RVPPCR: NOT DETECTED
MYCOPLASMA PNEUMONIAE-RVPPCR: NOT DETECTED
PARAINFLUENZA VIRUS 1-RVPPCR: NOT DETECTED
PARAINFLUENZA VIRUS 4-RVPPCR: NOT DETECTED
Parainfluenza Virus 2: NOT DETECTED
Parainfluenza Virus 3: NOT DETECTED
RESPIRATORY SYNCYTIAL VIRUS-RVPPCR: NOT DETECTED
Rhinovirus / Enterovirus: NOT DETECTED

## 2016-03-18 LAB — ECHOCARDIOGRAM COMPLETE
HEIGHTINCHES: 72 in
WEIGHTICAEL: 7248 [oz_av]

## 2016-03-18 LAB — COMPREHENSIVE METABOLIC PANEL
ALBUMIN: 2.8 g/dL — AB (ref 3.5–5.0)
ALK PHOS: 58 U/L (ref 38–126)
ALT: 26 U/L (ref 17–63)
AST: 34 U/L (ref 15–41)
Anion gap: 8 (ref 5–15)
BILIRUBIN TOTAL: 1.1 mg/dL (ref 0.3–1.2)
BUN: 13 mg/dL (ref 6–20)
CALCIUM: 8.7 mg/dL — AB (ref 8.9–10.3)
CO2: 24 mmol/L (ref 22–32)
Chloride: 106 mmol/L (ref 101–111)
Creatinine, Ser: 1.27 mg/dL — ABNORMAL HIGH (ref 0.61–1.24)
GFR, EST NON AFRICAN AMERICAN: 54 mL/min — AB (ref >60)
GLUCOSE: 270 mg/dL — AB (ref 65–99)
Potassium: 4.2 mmol/L (ref 3.5–5.1)
Sodium: 138 mmol/L (ref 135–145)
TOTAL PROTEIN: 6.2 g/dL — AB (ref 6.5–8.1)

## 2016-03-18 LAB — MAGNESIUM: MAGNESIUM: 1.4 mg/dL — AB (ref 1.7–2.4)

## 2016-03-18 LAB — GLUCOSE, CAPILLARY
GLUCOSE-CAPILLARY: 186 mg/dL — AB (ref 65–99)
Glucose-Capillary: 163 mg/dL — ABNORMAL HIGH (ref 65–99)
Glucose-Capillary: 214 mg/dL — ABNORMAL HIGH (ref 65–99)
Glucose-Capillary: 222 mg/dL — ABNORMAL HIGH (ref 65–99)
Glucose-Capillary: 228 mg/dL — ABNORMAL HIGH (ref 65–99)

## 2016-03-18 LAB — APTT: APTT: 35 s (ref 24–36)

## 2016-03-18 LAB — LACTIC ACID, PLASMA
LACTIC ACID, VENOUS: 3.5 mmol/L — AB (ref 0.5–1.9)
Lactic Acid, Venous: 3.9 mmol/L (ref 0.5–1.9)
Lactic Acid, Venous: 3.4 mmol/L (ref 0.5–1.9)

## 2016-03-18 LAB — CREATININE, URINE, RANDOM: Creatinine, Urine: 192.37 mg/dL

## 2016-03-18 LAB — I-STAT TROPONIN, ED: TROPONIN I, POC: 0.01 ng/mL (ref 0.00–0.08)

## 2016-03-18 LAB — I-STAT CG4 LACTIC ACID, ED: Lactic Acid, Venous: 5.1 mmol/L (ref 0.5–1.9)

## 2016-03-18 LAB — SODIUM, URINE, RANDOM: Sodium, Ur: 74 mmol/L

## 2016-03-18 LAB — CBC
HEMATOCRIT: 38 % — AB (ref 39.0–52.0)
HEMOGLOBIN: 12.9 g/dL — AB (ref 13.0–17.0)
MCH: 30 pg (ref 26.0–34.0)
MCHC: 33.9 g/dL (ref 30.0–36.0)
MCV: 88.4 fL (ref 78.0–100.0)
Platelets: 148 10*3/uL — ABNORMAL LOW (ref 150–400)
RBC: 4.3 MIL/uL (ref 4.22–5.81)
RDW: 14 % (ref 11.5–15.5)
WBC: 17.1 10*3/uL — AB (ref 4.0–10.5)

## 2016-03-18 LAB — TSH: TSH: 1.918 u[IU]/mL (ref 0.350–4.500)

## 2016-03-18 LAB — PROCALCITONIN: PROCALCITONIN: 18.96 ng/mL

## 2016-03-18 LAB — PROTIME-INR
INR: 1.17
PROTHROMBIN TIME: 15 s (ref 11.4–15.2)

## 2016-03-18 LAB — MRSA PCR SCREENING: MRSA BY PCR: NEGATIVE

## 2016-03-18 LAB — PHOSPHORUS: PHOSPHORUS: 2.9 mg/dL (ref 2.5–4.6)

## 2016-03-18 MED ORDER — DEXTROSE 5 % IV SOLN
2.0000 g | INTRAVENOUS | Status: AC
  Administered 2016-03-18 – 2016-03-22 (×5): 2 g via INTRAVENOUS
  Filled 2016-03-18 (×5): qty 2

## 2016-03-18 MED ORDER — ACETAMINOPHEN 325 MG PO TABS
650.0000 mg | ORAL_TABLET | Freq: Four times a day (QID) | ORAL | Status: DC | PRN
  Administered 2016-03-19: 650 mg via ORAL
  Filled 2016-03-18: qty 2

## 2016-03-18 MED ORDER — ASPIRIN 325 MG PO TABS
325.0000 mg | ORAL_TABLET | Freq: Every day | ORAL | Status: DC
  Administered 2016-03-18 – 2016-03-24 (×7): 325 mg via ORAL
  Filled 2016-03-18 (×7): qty 1

## 2016-03-18 MED ORDER — TAMSULOSIN HCL 0.4 MG PO CAPS
0.4000 mg | ORAL_CAPSULE | Freq: Every day | ORAL | Status: DC
Start: 2016-03-18 — End: 2016-03-24
  Administered 2016-03-18 – 2016-03-24 (×7): 0.4 mg via ORAL
  Filled 2016-03-18 (×7): qty 1

## 2016-03-18 MED ORDER — IPRATROPIUM BROMIDE 0.02 % IN SOLN
0.5000 mg | Freq: Four times a day (QID) | RESPIRATORY_TRACT | Status: DC
  Administered 2016-03-18: 0.5 mg via RESPIRATORY_TRACT
  Filled 2016-03-18: qty 2.5

## 2016-03-18 MED ORDER — LEVALBUTEROL HCL 0.63 MG/3ML IN NEBU: 0.6300 mg | INHALATION_SOLUTION | Freq: Four times a day (QID) | RESPIRATORY_TRACT | Status: DC | PRN

## 2016-03-18 MED ORDER — BISACODYL 10 MG RE SUPP: 10.0000 mg | Freq: Every day | RECTAL | Status: DC | PRN

## 2016-03-18 MED ORDER — ONDANSETRON HCL 4 MG PO TABS: 4.0000 mg | ORAL_TABLET | Freq: Four times a day (QID) | ORAL | Status: DC | PRN

## 2016-03-18 MED ORDER — SODIUM CHLORIDE 0.9% FLUSH
3.0000 mL | Freq: Two times a day (BID) | INTRAVENOUS | Status: DC
  Administered 2016-03-18 – 2016-03-24 (×8): 3 mL via INTRAVENOUS

## 2016-03-18 MED ORDER — GUAIFENESIN ER 600 MG PO TB12
600.0000 mg | ORAL_TABLET | Freq: Two times a day (BID) | ORAL | Status: DC
  Administered 2016-03-18 – 2016-03-24 (×13): 600 mg via ORAL
  Filled 2016-03-18 (×12): qty 1

## 2016-03-18 MED ORDER — PERFLUTREN LIPID MICROSPHERE
1.0000 mL | INTRAVENOUS | Status: AC | PRN
  Administered 2016-03-18: 4 mL via INTRAVENOUS
  Filled 2016-03-18 (×2): qty 10

## 2016-03-18 MED ORDER — HEPARIN SODIUM (PORCINE) 5000 UNIT/ML IJ SOLN
5000.0000 [IU] | Freq: Three times a day (TID) | INTRAMUSCULAR | Status: DC
  Administered 2016-03-18 – 2016-03-24 (×19): 5000 [IU] via SUBCUTANEOUS
  Filled 2016-03-18 (×20): qty 1

## 2016-03-18 MED ORDER — INSULIN DETEMIR 100 UNIT/ML ~~LOC~~ SOLN
60.0000 [IU] | Freq: Every day | SUBCUTANEOUS | Status: DC
  Administered 2016-03-18 – 2016-03-24 (×7): 60 [IU] via SUBCUTANEOUS
  Filled 2016-03-18 (×8): qty 0.6

## 2016-03-18 MED ORDER — SODIUM CHLORIDE 0.9 % IV SOLN
INTRAVENOUS | Status: DC
  Administered 2016-03-19 – 2016-03-22 (×6): via INTRAVENOUS

## 2016-03-18 MED ORDER — SODIUM CHLORIDE 0.9 % IV BOLUS (SEPSIS): 500.0000 mL | Freq: Once | INTRAVENOUS | Status: DC

## 2016-03-18 MED ORDER — LEVOTHYROXINE SODIUM 100 MCG PO TABS
200.0000 ug | ORAL_TABLET | Freq: Every day | ORAL | Status: DC
  Administered 2016-03-18 – 2016-03-24 (×7): 200 ug via ORAL
  Filled 2016-03-18 (×2): qty 2
  Filled 2016-03-18: qty 1
  Filled 2016-03-18 (×4): qty 2
  Filled 2016-03-18: qty 1
  Filled 2016-03-18: qty 2

## 2016-03-18 MED ORDER — SODIUM CHLORIDE 0.9 % IV BOLUS (SEPSIS)
1000.0000 mL | Freq: Once | INTRAVENOUS | Status: AC
  Administered 2016-03-18: 1000 mL via INTRAVENOUS

## 2016-03-18 MED ORDER — ACETAMINOPHEN 650 MG RE SUPP: 650.0000 mg | Freq: Four times a day (QID) | RECTAL | Status: DC | PRN

## 2016-03-18 MED ORDER — ONDANSETRON HCL 4 MG/2ML IJ SOLN: 4.0000 mg | Freq: Four times a day (QID) | INTRAMUSCULAR | Status: DC | PRN

## 2016-03-18 MED ORDER — SENNA 8.6 MG PO TABS
1.0000 | ORAL_TABLET | Freq: Two times a day (BID) | ORAL | Status: DC
  Administered 2016-03-18 – 2016-03-24 (×10): 8.6 mg via ORAL
  Filled 2016-03-18 (×12): qty 1

## 2016-03-18 MED ORDER — SODIUM CHLORIDE 0.9 % IV SOLN
INTRAVENOUS | Status: AC
  Administered 2016-03-18: 01:00:00 via INTRAVENOUS

## 2016-03-18 MED ORDER — HYDROCODONE-ACETAMINOPHEN 5-325 MG PO TABS: 1.0000 | ORAL_TABLET | ORAL | Status: DC | PRN

## 2016-03-18 MED ORDER — INSULIN ASPART 100 UNIT/ML ~~LOC~~ SOLN
0.0000 [IU] | SUBCUTANEOUS | Status: DC
  Administered 2016-03-18: 5 [IU] via SUBCUTANEOUS
  Administered 2016-03-19: 2 [IU] via SUBCUTANEOUS
  Administered 2016-03-19: 3 [IU] via SUBCUTANEOUS
  Administered 2016-03-19 (×2): 2 [IU] via SUBCUTANEOUS
  Administered 2016-03-19: 3 [IU] via SUBCUTANEOUS
  Administered 2016-03-20: 2 [IU] via SUBCUTANEOUS
  Administered 2016-03-20 (×3): 3 [IU] via SUBCUTANEOUS
  Administered 2016-03-20: 2 [IU] via SUBCUTANEOUS
  Administered 2016-03-21: 3 [IU] via SUBCUTANEOUS
  Administered 2016-03-21 (×2): 2 [IU] via SUBCUTANEOUS
  Administered 2016-03-21: 5 [IU] via SUBCUTANEOUS
  Administered 2016-03-21 – 2016-03-22 (×4): 2 [IU] via SUBCUTANEOUS
  Administered 2016-03-23: 3 [IU] via SUBCUTANEOUS
  Administered 2016-03-23 – 2016-03-24 (×4): 2 [IU] via SUBCUTANEOUS

## 2016-03-18 MED ORDER — IPRATROPIUM BROMIDE 0.02 % IN SOLN: 0.5000 mg | Freq: Four times a day (QID) | RESPIRATORY_TRACT | Status: DC | PRN

## 2016-03-18 MED ORDER — ATORVASTATIN CALCIUM 40 MG PO TABS
40.0000 mg | ORAL_TABLET | Freq: Every day | ORAL | Status: DC
  Administered 2016-03-18 – 2016-03-23 (×6): 40 mg via ORAL
  Filled 2016-03-18 (×6): qty 1

## 2016-03-18 MED ORDER — INSULIN ASPART 100 UNIT/ML ~~LOC~~ SOLN
0.0000 [IU] | SUBCUTANEOUS | Status: DC
  Administered 2016-03-18: 5 [IU] via SUBCUTANEOUS
  Administered 2016-03-18: 3 [IU] via SUBCUTANEOUS
  Administered 2016-03-18: 2 [IU] via SUBCUTANEOUS
  Administered 2016-03-18: 3 [IU] via SUBCUTANEOUS

## 2016-03-18 NOTE — ED Notes (Signed)
[place] in progress.  To transport to the floor when complete.

## 2016-03-18 NOTE — H&P (Signed)
Derek RIESGO [id number] DOB: November 28, 1940 DOA: 03/17/2016     PCP: [name], MD   Outpatient Specialists: [facility] Patient coming from:  home Lives alone,   but daughter checks on him frequently    Chief Complaint: Confusion  HPI: Derek Bass is a 75 y.o. male with medical history significant of transient 2 uncontrolled, HTN, HDL  Chronic morbid obesity, chronic lymphedema  Presented from home with confusion after daughter called EMS saying the patient does not seem to be like himself anemia survival patient was unable to stand up his lower extremities noted to be more swollen than usual vital signs blood pressure 97/54 heart rate 140 blood sugar over 306. Family states he was wheezing initially he had severe chills. Family denies any sick contacts. He was incontinent of urine. Denies chest pain.    Examination did not take his medications recently. He hasn't been following up with his primary PCP   Regarding pertinent Chronic problems: She has poorly controlled diabetes with noncompliance to diet.  Regarding chronic lymphedema uses lymphedema pump and dry J compression stockings   IN ER:  Temp (24hrs), Avg:99.9 F (37.7 C), Min:99.4 F (37.4 C), Max:100.4 F (38 C)     Respirations 20 satting 100% on room air heart rate down to 125 from initial 140s blood pressure 98/32 Lactic acid 6.10 WBC 17.1 Hg 15  Cr 1.65 up from baseline of 0.81  CT head no acute intracranial normality moderate atrophy Chest x-ray showing cardiomegaly with diffuse pulmonary congestion and interstitial prominence diffuse pulmonary interstitial edema.  Following Medications were ordered in ER: Medications  cefTRIAXone (ROCEPHIN) 2 g in dextrose 5 % 50 mL IVPB (2 g Intravenous New Bag/Given 03/17/16 2348)  sodium chloride 0.9 % bolus 1,000 mL (1,000 mLs Intravenous New Bag/Given 03/17/16 2201)  sodium chloride 0.9 % bolus 1,000 mL (1,000 mLs Intravenous New  Bag/Given 03/17/16 2348)      Hospitalist was called for admission for Severe sepsis  Review of Systems:    Pertinent positives include:  Fevers, chills, confusion  Constitutional:  No weight loss, night sweats, fatigue, weight loss  HEENT:  No headaches, Difficulty swallowing,Tooth/dental problems,Sore throat,  No sneezing, itching, ear ache, nasal congestion, post nasal drip,  Cardio-vascular:  No chest pain, Orthopnea, PND, anasarca, dizziness, palpitations.no Bilateral lower extremity swelling  GI:  No heartburn, indigestion, abdominal pain, nausea, vomiting, diarrhea, change in bowel habits, loss of appetite, melena, blood in stool, hematemesis Resp:  no shortness of breath at rest. No dyspnea on exertion, No excess mucus, no productive cough, No non-productive cough, No coughing up of blood.No change in color of mucus.No wheezing. Skin:  no rash or lesions. No jaundice GU:  no dysuria, change in color of urine, no urgency or frequency. No straining to urinate.  No flank pain.  Musculoskeletal:  No joint pain or no joint swelling. No decreased range of motion. No back pain.  Psych:  No change in mood or affect. No depression or anxiety. No memory loss.  Neuro: no localizing neurological complaints, no tingling, no weakness, no double vision, no gait abnormality, no slurred speech, no confusion  As per HPI otherwise 10 point review of systems negative.   Past Medical History: Past Medical History:  Diagnosis Date  . Colon polyps 04/2010   tubular adenomas, rpt colonoscopy 3 years [facility] [place])  . Hepatitis 1961   when in service, thinks viral  . History of prostate cancer    tx seed implants  in [place] now followed by Dr. Gaynelle Arabian with yearly PSAs  . HLD (hyperlipidemia)   . HTN (hypertension)   . Hypothyroid   . Lymphedema 05/10/2013   Lymphedema pump per [facility] Dr Delana Meyer   . Obesity   . T2DM (type 2 diabetes mellitus) ([place])    underwent DMSE remotely  . Vitamin D deficiency     Past Surgical History:  Procedure Laterality Date  . COLONOSCOPY  2012   tubular adenoma mult polyps, int/ext hemorrhoids, diverticula, rpt 3 yeras  . goiter removed  2002   on calcium ever since  . INSERTION PROSTATE RADIATION SEED  2003  . [facility]  . US ECHOCARDIOGRAPHY  03/2013   Mild LVH and diastolic dysfunction, mild LA dilation, EF 55-60%, could not measure PA pressure.     Social History:  Ambulatory   independently     reports that he quit smoking about 31 years ago. He has never used smokeless tobacco. He reports that he does not drink alcohol or use drugs.  Allergies:  No Known Allergies     Family History:   Family History  Problem Relation Age of Onset  . Coronary artery disease Mother 63  . Prostate cancer Brother   . Diabetes Brother   . Stroke Neg Hx     Medications: Prior to Admission medications   Medication Sig Start Date End Date Taking? Authorizing Provider  acetaminophen (TYLENOL) 650 MG CR tablet Take 650 mg by mouth every 8 (eight) hours as needed for pain.    Yes Historical Provider, MD  ammonium lactate (AMLACTIN) 12 % cream APPLY TOPICALLY AS NEEDED. 12/29/15  Yes [name], MD  aspirin 325 MG tablet Take 325 mg by mouth daily.   Yes Historical Provider, MD  atorvastatin (LIPITOR) 40 MG tablet Take one tablet daily **MUST HAVE PHYSICAL FOR FURTHER REFILLS** 01/14/15  Yes [name], MD  calcium-vitamin D (OSCAL WITH D) 500-200 MG-UNIT per tablet Take 1 tablet by mouth daily.   Yes Historical Provider, MD  clotrimazole (LOTRIMIN) 1 % cream APPLY TOPICALLY TWICE A DAY TO INTERDIGITAL WEB SPACES 08/19/14  Yes [name], MD  clotrimazole (LOTRIMIN) 1 % cream APPLY 1 APPLICATION TOPICALLY 2 (TWO) TIMES DAILY. INTERDIGITAL WEB SPACES 12/29/15  Yes [name], MD  furosemide (LASIX) 20 MG tablet Take 1 tablet (20 mg total) by mouth 2 (two) times daily as needed. 04/14/15  Yes [name], MD  glucose 4 GM chewable tablet Chew 1 tablet (4 g total) by mouth as needed for low blood sugar. 04/14/15  Yes [name], MD  Insulin Detemir (LEVEMIR FLEXTOUCH) 100 UNIT/ML Pen Inject 60 Units into the skin daily with breakfast. 04/21/15  Yes [name], MD  levothyroxine (SYNTHROID, LEVOTHROID) 200 MCG tablet TAKE 1 TABLET (200 MCG TOTAL) BY MOUTH DAILY. 07/22/14  Yes [name], MD  loperamide (IMODIUM A-D) 2 MG tablet Take 2 mg by mouth as needed.   Yes Historical Provider, MD  metFORMIN (GLUCOPHAGE) 1000 MG tablet TAKE 1 TABLET BY MOUTH TWICE A DAY WITH MEALS 06/17/15  Yes [name], MD  metoprolol (LOPRESSOR) 100 MG tablet TAKE 1 TABLET BY MOUTH 2 TIMES DAILY. 07/22/14  Yes [name], MD  Multiple Vitamin (MULTIVITAMIN) capsule Take 1 capsule by mouth daily.   Yes Historical Provider, MD  NOVOLIN R 100 UNIT/ML injection INJECT 0.1 MLS (10 UNITS TOTAL) INTO THE SKIN 2 (TWO) TIMES DAILY BEFORE A MEAL. 03/03/16  Yes Garlon Hatchet  Danise Mina, MD  Omega-3 Fatty Acids (OMEGA 3 PO) Take 2 capsules by mouth daily. Reported on 04/14/2015   Yes Historical Provider, MD  quinapril (ACCUPRIL) 20 MG tablet TAKE 1 TABLET BY MOUTH TWICE A DAY 08/18/13  Yes [name], MD  spironolactone (ALDACTONE) 25 MG tablet TAKE 1 TABLET BY MOUTH 2 TIMES A DAY 07/02/13  Yes [name], MD  tamsulosin (FLOMAX) 0.4 MG CAPS capsule TAKE ONE CAPSULE BY MOUTH EVERY DAY 08/11/15  Yes [name], MD  urea (CARMOL) 40 % CREA APPLY TO BOTTOMS OF HEELS EVERY DAY AS NEEDED FOR CRACKING HEELS 06/11/15  Yes Wallene Huh, DPM  tamsulosin (FLOMAX) 0.4 MG CAPS capsule Take 1 capsule (0.4 mg total) by mouth daily. Patient not taking: Reported on 03/17/2016 07/16/15   [name], MD    Physical Exam: Patient Vitals for the past 24 hrs:  BP Temp Temp src Pulse Resp SpO2  03/17/16 2330 107/82 - - (!) 124 - 98 %  03/17/16 2300 - - - (!) 121 20 100 %  03/17/16 2245 142/69 - - - 20 -    03/17/16 2215 140/63 - - - 19 -  03/17/16 2206 (!) 152/110 100.4 F (38 C) Rectal (!) 124 16 98 %  03/17/16 2200 (!) 152/110 - - (!) 122 (!) 27 100 %  03/17/16 2046 (!) 149/124 99.4 F (37.4 C) Oral (!) 126 16 98 %  03/17/16 2045 (!) 149/124 - - 119 16 96 %  03/17/16 2023 - - - - - 96 %    1. General:  in No Acute distress 2. Psychological: Alert and  Oriented To self only 3. Head/ENT:   Dry Mucous Membranes                          Head Non traumatic, neck supple                           Poor Dentition 4. SKIN:  decreased Skin turgor,  Skin clean Dry and intact no rash 5. Heart: Regular rate and rhythm no Murmur, Rub or gallop 6. Lungs: Distant  breath sounds no wheezes or crackles   7. Abdomen: Soft,  non-tender, Non distended obese 8. Lower extremities: no clubbing, cyanosis, significant edema right worse than left 9. Neurologically Grossly intact, moving all 4 extremities equally   10. MSK: Normal range of motion   body mass index is unknown because there is no height or weight on file.  Labs on Admission:   Labs on Admission: I have personally reviewed following labs and imaging studies  CBC:  Recent Labs Lab 03/17/16 2104  WBC 17.1*  NEUTROABS 15.5*  HGB 15.0  HCT 44.9  MCV 90.7  PLT [id number]   Basic Metabolic Panel:  Recent Labs Lab 03/17/16 2104  NA 138  K 4.6  CL 102  CO2 23  GLUCOSE 282*  BUN 10  CREATININE 1.65*  CALCIUM 9.6   GFR: CrCl cannot be calculated (Unknown ideal weight.). Liver Function Tests:  Recent Labs Lab 03/17/16 2104  AST 41  ALT 32  ALKPHOS 72  BILITOT 1.7*  PROT 7.2  ALBUMIN 3.4*   No results for input(s): LIPASE, AMYLASE in the last 168 hours. No results for input(s): AMMONIA in the last 168 hours. Coagulation Profile: No results for input(s): INR, PROTIME in the last 168 hours. Cardiac Enzymes: No results for input(s): CKTOTAL, CKMB, CKMBINDEX, TROPONINI  in the last 168 hours. BNP (last 3 results) No results  for input(s): PROBNP in the last 8760 hours. HbA1C: No results for input(s): HGBA1C in the last 72 hours. CBG: No results for input(s): GLUCAP in the last 168 hours. Lipid Profile: No results for input(s): CHOL, HDL, LDLCALC, TRIG, CHOLHDL, LDLDIRECT in the last 72 hours. Thyroid Function Tests: No results for input(s): TSH, T4TOTAL, FREET4, T3FREE, THYROIDAB in the last 72 hours. Anemia Panel: No results for input(s): VITAMINB12, FOLATE, FERRITIN, TIBC, IRON, RETICCTPCT in the last 72 hours. Urine analysis:    Component Value Date/Time   COLORURINE AMBER (A) 03/17/2016 2306   APPEARANCEUR HAZY (A) 03/17/2016 2306   LABSPEC 1.016 03/17/2016 2306   PHURINE 5.0 03/17/2016 2306   GLUCOSEU >=500 (A) 03/17/2016 2306   HGBUR LARGE (A) 03/17/2016 2306   HGBUR Neg 04/13/2010 [place] 03/17/2016 2306   BILIRUBINUR neg 09/19/2012 1125   KETONESUR 5 (A) 03/17/2016 2306   PROTEINUR 30 (A) 03/17/2016 2306   UROBILINOGEN 0.2 09/19/2012 1125   UROBILINOGEN 1.0 04/13/2010 1120   NITRITE NEGATIVE 03/17/2016 2306   LEUKOCYTESUR MODERATE (A) 03/17/2016 2306   Sepsis Labs: @LABRCNTIP (procalcitonin:4,lacticidven:4) )No results found for this or any previous visit (from the past 240 hour(s)).    UA Evidence of hematuria and leukocytosis with rare bacteria  Lab Results  Component Value Date   HGBA1C 7.4 (H) 04/14/2015    CrCl cannot be calculated (Unknown ideal weight.).  BNP (last 3 results) No results for input(s): PROBNP in the last 8760 hours.   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   Cultures: No results found for: SDES, [place], CULT, REPTSTATUS   Radiological Exams on Admission: Dg Chest 2 View  Result Date: 03/17/2016 CLINICAL DATA:  Initial e acute altered  mental status. Valuation for EXAM: CHEST  2 VIEW COMPARISON:  None available. FINDINGS: Mild to moderate cardiomegaly. Mediastinal silhouette within normal limits. Lungs are mildly hypoinflated. There is diffuse vascular congestion with interstitial prominence, suggesting mild pulmonary interstitial edema. No definite pleural effusion, although evaluation somewhat limited by patient body habitus. No consolidative airspace disease. No pneumothorax. No acute osseous abnormality. IMPRESSION: Cardiomegaly with diffuse pulmonary vascular congestion and interstitial prominence, suggesting mild diffuse pulmonary interstitial edema. Electronically Signed   By: Jeannine Boga M.D.   On: 03/17/2016 21:27   Ct Head Wo Contrast  Result Date: 03/17/2016 CLINICAL DATA:  Altered mental status EXAM: CT HEAD WITHOUT CONTRAST TECHNIQUE: Contiguous axial images were obtained from the base of the skull through the vertex without intravenous contrast. COMPARISON:  None. FINDINGS: Brain: No mass lesion, intraparenchymal hemorrhage or extra-axial collection. No evidence of acute cortical infarct. There is moderate atrophy. There is periventricular hypoattenuation compatible with chronic microvascular disease. Vascular: No hyperdense vessel or unexpected calcification. Skull: Normal visualized skull base, calvarium and extracranial soft tissues. Sinuses/Orbits: No sinus fluid levels or advanced mucosal thickening. No mastoid effusion. Normal orbits. IMPRESSION: 1. No acute intracranial abnormality. 2. Moderate atrophy and chronic microvascular ischemia. Electronically Signed   By: Ulyses Jarred M.D.   On: 03/17/2016 21:47    Chart has been reviewed    Assessment/Plan  75 y.o. male with medical history significant of transient 2 uncontrolled, HTN, HDL  Chronic morbid obesity, chronic lymphedema admitted for severe sepsis and acute  encephalopathy  Present on Admission:   . Sepsis ([place])- Admit per Sepsis protocol  likely source being possibly UTI,   - rehydrate with 55ml/kg  - initiate broad spectrum antibiotics Vancomycin and Zosyn  -  obtain blood cultures  - Obtain serial lactic acid  - Obtain procalcitonin level  - Admit and monitor vital signs closely  -  PCCM has been consulted Check respiratory panel Sepsis - Repeat Assessment  Performed at:    00:45  Vitals     Blood pressure 118/65, pulse (!) 123, temperature 99.5 F (37.5 C), temperature source Oral, resp. rate 22, height 6' (1.829 m), weight (!) 205.5 kg (453 lb), SpO2 97 %.  Heart:     Tachycardic  Lungs:    Distant but no wheezes or crackles  Capillary Refill:   <2 sec  Peripheral Pulse:   Radial pulse palpable  Skin:     Normal Color . Diabetes type 2, uncontrolled ([place]) . HLD (hyperlipidemia) stable continue home medication . HTN (hypertension) hold home medications given hypotension . Hypothyroidism continue Synthroid . UTI (urinary tract infection) questionable true UTI we'll await results of urine culture broadly cover for sepsis which cover urinary source as well . Cardiomegaly  monitor on telemetry obtain echogram now the blood pressure has improved will avoid fluid overload  lymphedema chronic family is concerned if he is having DVT secondary to severe leg edema evidence of right leg swelling and some warmth. Will   obtain Dopplers Acute encephalopathy in the setting of infectious process but given the word finding difficulty we'll obtain MRI to rule out CVA Given hematuria will obtain renal ultrasound Acute renal failure will administer IV fluids monitor urine function obtain urine electrolytes Given abnormal EKG will cycle cardiac enzymes monitor on telemetry chest pain-free Other plan as per orders. DVT prophylaxis:    Hep [place]  Code Status:  FULL CODE   as per  family   Family Communication:   Family  at  Bedside  plan of care was discussed with  Daughter and  Wife,    Disposition Plan:   likely will need  placement for rehabilitation                                                 Would benefit from PT/OT eval prior to DC   Order once stable                       Diabetes coordinator     consulted                          Consults called: PCCM   Admission status:   Inpatient    Level of care        SDU      I have spent a total of 67 min on this admission  extra time was spent to discuss case  With PCCM   [place] 03/18/2016, 1:02 AM    [facility]  Pager [phone]   after 2 AM please page floor coverage PA If 7AM-7PM, please contact the day team taking care of the patient  [email or web address]  Password [email or web address]

## 2016-03-18 NOTE — Progress Notes (Signed)
eLink Physician-Brief Progress Note Patient Name: Derek Bass DOB: [birth year] MRN: [id number]   Date of Service  03/18/2016  HPI/Events of Note  Pt in ER with sepsis, possibly due to UTI, with confusion. BP stable.   eICU Interventions  Continue sepsis protocol, will monitor.         Laverle Hobby 03/18/2016, 12:54 AM

## 2016-03-18 NOTE — ED Notes (Signed)
Attempted report to floor.  Nurse reports that they are unsure if they can care for this patient and she is going to call [facility].  Waiting to hear back.

## 2016-03-18 NOTE — Progress Notes (Signed)
Pt received from ED at 0449. Pt calm and alert, though a little confused. Oriented to self and place, not time or situation. F/C and responds appropriately. Has trouble finding words, unclear if aphasia or memory-related. NIH stroke scale done, scored 4. Pt reports some numbness in left fingers (may be r/t uncontrolled DM).  Of note, RLE seems inflamed. Skin is reddened and hot to touch, noticeably warmer and redder than LLE. Obviously more swollen than LLE. Temp 99.9 oral. ST 120s. BP stable. 97% RA. 19rpm. No c/o pain.  Will continue to monitor.  Henreitta Leber, RN 5:56 AM 03/18/16

## 2016-03-18 NOTE — Progress Notes (Signed)
CRITICAL VALUE ALERT  Critical value received:  Troponin 0.03  Date of notification:  03/18/2016  Time of notification:  11:27 am  Critical value read back: Yes   Nurse who received alert: Rick Duff RN  Critical Value expected. Value equal to previous result

## 2016-03-18 NOTE — ED Notes (Signed)
Daughter [phone] cell

## 2016-03-18 NOTE — Progress Notes (Signed)
  Echocardiogram 2D Echocardiogram with definity has been performed.  Bobbye Charleston 03/18/2016, 2:21 PM

## 2016-03-18 NOTE — Progress Notes (Addendum)
PROGRESS NOTE    Derek Bass  [id number] DOB: 06/07/1940 DOA: 03/17/2016 PCP: [name], MD   Brief Narrative:   75 y.o. BM PMHx prostate cancer S/P seed implants in [place] followed by Dr. Gaynelle Arabian, HTN, HLD, DM type II uncontrolled with complications, Hepatitis (while in the service), Hypothyroidism, Chronic morbid obesity, Chronic lymphedema  Presented from home with confusion after daughter called EMS saying the patient does not seem to be like himself anemia survival patient was unable to stand up his lower extremities noted to be more swollen than usual vital signs blood pressure 97/54 heart rate 140 blood sugar over 306. Family states he was wheezing initially he had severe chills. Family denies any sick contacts. He was incontinent of urine. Denies chest pain.    Examination did not take his medications recently. He hasn't been following up with his primary PCP   Regarding pertinent Chronic problems: She has poorly controlled diabetes with noncompliance to diet.  Regarding chronic lymphedema uses lymphedema pump and dry J compression stockings    Subjective: 12/21 A/O 4, states altered mental status secondary to not taking his medication. States mentation starting to clear.   Assessment & Plan:   Active Problems:   Diabetes type 2, uncontrolled (HCC)   HLD (hyperlipidemia)   HTN (hypertension)   Hypothyroidism   Morbid obesity with BMI of 50.0-59.9, adult (HCC)   Lymphedema   Sepsis ([place])   UTI (urinary tract infection)   Cardiomegaly   Chronic morbid obesity, chronic lymphedema admitted for severe sepsis and acute encephalopathy  Sepsis ([place]) - Admit per Sepsis protocol likely source being possibly UTI,   - rehydrate with 39ml/kg  - initiate broad spectrum antibiotics Vancomycin and Zosyn  -  obtain blood cultures  - Obtain serial lactic acid  - Obtain procalcitonin level  - Admit and monitor vital signs closely  -  PCCM has been  consulted Check respiratory panel . Diabetes type 2, uncontrolled (HCC) -Levemir 60 units daily -Moderate SSI  . HLD (hyperlipidemia) stable continue home medication . HTN (hypertension) hold home medications given hypotension . Hypothyroidism continue Synthroid . UTI (urinary tract infection) questionable true UTI we'll await results of urine culture broadly cover for sepsis which cover urinary source as well . Cardiomegaly  monitor on telemetry obtain echogram now the blood pressure has improved will avoid fluid overload  lymphedema chronic family is concerned if he is having DVT secondary to severe leg edema evidence of right leg swelling and some warmth. Will   obtain Dopplers Acute encephalopathy in the setting of infectious process but given the word finding difficulty we'll obtain MRI to rule out CVA Given hematuria will obtain renal ultrasound Acute renal failure will administer IV fluids monitor urine function obtain urine electrolytes Given abnormal EKG will cycle cardiac enzymes monitor on telemetry chest pain-free   DVT prophylaxis: Heparin Code Status: Full Family Communication: None Disposition Plan:    Consultants:    Procedures/Significant Events:  Echocardiogram pending   VENTILATOR SETTINGS:    Cultures   Antimicrobials: Anti-infectives    Start     Dose/Rate Route Frequency Ordered Stop   03/18/16 2200  cefTRIAXone (ROCEPHIN) 2 g in dextrose 5 % 50 mL IVPB     2 g 100 mL/hr over 30 Minutes Intravenous Every 24 hours 03/18/16 0516     03/17/16 2345  cefTRIAXone (ROCEPHIN) 2 g in dextrose 5 % 50 mL IVPB     2 g 100 mL/hr over 30 Minutes Intravenous  Once 03/17/16 2335 03/18/16 0018       Devices    LINES / TUBES:      Continuous Infusions: . sodium chloride 75 mL/hr at 03/18/16 0700     Objective: Vitals:   03/18/16 0522 03/18/16 0600 03/18/16 0700 03/18/16 0833  BP:  (!) 115/56 (!) 110/54   Pulse: (!) 122 (!) 121 (!) 118   Resp:  20 20 (!) 21   Temp: 99.9 F (37.7 C)  98 F (36.7 C)   TempSrc: Oral  Oral   SpO2: 98% 96% 97% 100%  Weight:      Height:        Intake/Output Summary (Last 24 hours) at 03/18/16 [id number] Last data filed at 03/18/16 0700  Gross per 24 hour  Intake          2491.75 ml  Output              500 ml  Net          1991.75 ml   Filed Weights   03/18/16 0053  Weight: (!) 205.5 kg (453 lb)    Examination:  General: A/O 4, NAD, No acute respiratory distress Eyes: negative scleral hemorrhage, negative anisocoria, negative icterus ENT: Negative Runny nose, negative gingival bleeding, Neck:  Negative scars, masses, torticollis, lymphadenopathy, JVD Lungs: Clear to auscultation bilaterally without wheezes or crackles Cardiovascular: Regular rate and rhythm without murmur gallop or rub normal S1 and S2 Abdomen: MORBIDLY OBESE, negative, nondistended, positive soft, bowel sounds, no rebound, no ascites, no appreciable mass Extremities: positive significant cyanosis, clubbing, positive edema bilateral lower extremities Skin: Negative rashes, lesions, ulcers Psychiatric:  Negative depression, negative anxiety, negative fatigue, negative mania  Central nervous system:  Cranial nerves II through XII intact, tongue/uvula midline, all extremities muscle strength 5/5, sensation intact throughout, negative dysarthria, negative expressive aphasia, negative receptive aphasia.  .     Data Reviewed: Care during the described time interval was provided by me .  I have reviewed this patient's available data, including medical history, events of note, physical examination, and all test results as part of my evaluation. I have personally reviewed and interpreted all radiology studies.  CBC:  Recent Labs Lab 03/17/16 2104 03/18/16 0326  WBC 17.1* 17.1*  NEUTROABS 15.5*  --   HGB 15.0 12.9*  HCT 44.9 38.0*  MCV 90.7 88.4  PLT 150 123456*   Basic Metabolic Panel:  Recent Labs Lab 03/17/16 2104  03/18/16 0326  NA 138 138  K 4.6 4.2  CL 102 106  CO2 23 24  GLUCOSE 282* 270*  BUN 10 13  CREATININE 1.65* 1.27*  CALCIUM 9.6 8.7*  MG  --  1.4*  PHOS  --  2.9   GFR: Estimated Creatinine Clearance: 93 mL/min (by C-G formula based on SCr of 1.27 mg/dL (H)). Liver Function Tests:  Recent Labs Lab 03/17/16 2104 03/18/16 0326  AST 41 34  ALT 32 26  ALKPHOS 72 58  BILITOT 1.7* 1.1  PROT 7.2 6.2*  ALBUMIN 3.4* 2.8*   No results for input(s): LIPASE, AMYLASE in the last 168 hours. No results for input(s): AMMONIA in the last 168 hours. Coagulation Profile:  Recent Labs Lab 03/18/16 0326  INR 1.17   Cardiac Enzymes:  Recent Labs Lab 03/18/16 0326  TROPONINI <0.03   BNP (last 3 results) No results for input(s): PROBNP in the last 8760 hours. HbA1C: No results for input(s): HGBA1C in the last 72 hours. CBG: No results for input(s): GLUCAP in  the last 168 hours. Lipid Profile: No results for input(s): CHOL, HDL, LDLCALC, TRIG, CHOLHDL, LDLDIRECT in the last 72 hours. Thyroid Function Tests:  Recent Labs  03/18/16 0326  TSH 1.918   Anemia Panel: No results for input(s): VITAMINB12, FOLATE, FERRITIN, TIBC, IRON, RETICCTPCT in the last 72 hours. Urine analysis:    Component Value Date/Time   COLORURINE AMBER (A) 03/17/2016 2306   APPEARANCEUR HAZY (A) 03/17/2016 2306   LABSPEC 1.016 03/17/2016 2306   PHURINE 5.0 03/17/2016 2306   GLUCOSEU >=500 (A) 03/17/2016 2306   HGBUR LARGE (A) 03/17/2016 2306   HGBUR Neg 04/13/2010 [place] 03/17/2016 2306   BILIRUBINUR neg 09/19/2012 1125   KETONESUR 5 (A) 03/17/2016 2306   PROTEINUR 30 (A) 03/17/2016 2306   UROBILINOGEN 0.2 09/19/2012 1125   UROBILINOGEN 1.0 04/13/2010 1120   NITRITE NEGATIVE 03/17/2016 2306   LEUKOCYTESUR MODERATE (A) 03/17/2016 2306   Sepsis Labs: @LABRCNTIP (procalcitonin:4,lacticidven:4)  )No results found for this or any previous visit (from the past 240 hour(s)).        Radiology Studies: Dg Chest 2 View  Result Date: 03/17/2016 CLINICAL DATA:  Initial e acute altered mental status. Valuation for EXAM: CHEST  2 VIEW COMPARISON:  None available. FINDINGS: Mild to moderate cardiomegaly. Mediastinal silhouette within normal limits. Lungs are mildly hypoinflated. There is diffuse vascular congestion with interstitial prominence, suggesting mild pulmonary interstitial edema. No definite pleural effusion, although evaluation somewhat limited by patient body habitus. No consolidative airspace disease. No pneumothorax. No acute osseous abnormality. IMPRESSION: Cardiomegaly with diffuse pulmonary vascular congestion and interstitial prominence, suggesting mild diffuse pulmonary interstitial edema. Electronically Signed   By: Jeannine Boga M.D.   On: 03/17/2016 21:27   Ct Head Wo Contrast  Result Date: 03/17/2016 CLINICAL DATA:  Altered mental status EXAM: CT HEAD WITHOUT CONTRAST TECHNIQUE: Contiguous axial images were obtained from the base of the skull through the vertex without intravenous contrast. COMPARISON:  None. FINDINGS: Brain: No mass lesion, intraparenchymal hemorrhage or extra-axial collection. No evidence of acute cortical infarct. There is moderate atrophy. There is periventricular hypoattenuation compatible with chronic microvascular disease. Vascular: No hyperdense vessel or unexpected calcification. Skull: Normal visualized skull base, calvarium and extracranial soft tissues. Sinuses/Orbits: No sinus fluid levels or advanced mucosal thickening. No mastoid effusion. Normal orbits. IMPRESSION: 1. No acute intracranial abnormality. 2. Moderate atrophy and chronic microvascular ischemia. Electronically Signed   By: Ulyses Jarred M.D.   On: 03/17/2016 21:47   US Renal  Result Date: 03/18/2016 CLINICAL DATA:  75 year old male with hematuria, UTI, and sepsis. EXAM: RENAL / URINARY TRACT ULTRASOUND COMPLETE COMPARISON:  None. FINDINGS: Evaluation  of this exam is limited due to patient's body habitus. Right Kidney: Length: 12.1 cm.  No hydronephrosis or echogenic stone. Left Kidney: Length: 12.1 cm.  No hydronephrosis or echogenic stone. Bladder: Appears normal for degree of bladder distention. IMPRESSION: Unremarkable renal ultrasound on limited evaluation. Electronically Signed   By: Anner Crete M.D.   On: 03/18/2016 04:38        Scheduled Meds: . aspirin  325 mg Oral Daily  . atorvastatin  40 mg Oral q1800  . cefTRIAXone (ROCEPHIN)  IV  2 g Intravenous Q24H  . guaiFENesin  600 mg Oral BID  . heparin  5,000 Units Subcutaneous Q8H  . insulin aspart  0-9 Units Subcutaneous Q4H  . insulin detemir  60 Units Subcutaneous Daily  . ipratropium  0.5 mg Nebulization Q6H  . levothyroxine  200 mcg Oral QAC breakfast  .  senna  1 tablet Oral BID  . sodium chloride flush  3 mL Intravenous Q12H  . tamsulosin  0.4 mg Oral Daily   Continuous Infusions: . sodium chloride 75 mL/hr at 03/18/16 0700     LOS: 0 days    Time spent: 40 minutes    [place] Pizzimenti, Geraldo Docker, MD [facility] Pager [phone]   If 7PM-7AM, please contact night-coverage [email or web address] Password [facility] 03/18/2016, 8:38 AM

## 2016-03-18 NOTE — Progress Notes (Signed)
Pt brought to MRI for brain scan.  Pt was too large to fit in the biggest scanner we have.  Pt was taken back up to the floor.

## 2016-03-18 NOTE — Progress Notes (Signed)
**  Preliminary report by tech**  Bilateral lower extremity venous duplex completed. There is no obvious evidence of deep or superficial vein thrombosis involving the right and left lower extremities. All clearly visualized vessels appear patent and compressible. There is no evidence of Baker's cysts bilaterally.  03/18/16 3:52 PM Derek Bass RVT

## 2016-03-18 NOTE — Progress Notes (Signed)
CRITICAL VALUE ALERT  Critical value received:  Lactic Acid 3.4  Date of notification:  03/18/2016  Time of notification:  11:22  Critical value read back: yes  Nurse who received alert: Rick Duff RN  Critical Value expected, trending down

## 2016-03-19 DIAGNOSIS — N3001 Acute cystitis with hematuria: Secondary | ICD-10-CM

## 2016-03-19 DIAGNOSIS — I872 Venous insufficiency (chronic) (peripheral): Secondary | ICD-10-CM

## 2016-03-19 DIAGNOSIS — E1100 Type 2 diabetes mellitus with hyperosmolarity without nonketotic hyperglycemic-hyperosmolar coma (NKHHC): Secondary | ICD-10-CM

## 2016-03-19 DIAGNOSIS — E784 Other hyperlipidemia: Secondary | ICD-10-CM

## 2016-03-19 DIAGNOSIS — Z9114 Patient's other noncompliance with medication regimen: Secondary | ICD-10-CM

## 2016-03-19 LAB — BASIC METABOLIC PANEL
ANION GAP: 7 (ref 5–15)
BUN: 14 mg/dL (ref 6–20)
CHLORIDE: 104 mmol/L (ref 101–111)
CO2: 25 mmol/L (ref 22–32)
Calcium: 8.6 mg/dL — ABNORMAL LOW (ref 8.9–10.3)
Creatinine, Ser: 0.9 mg/dL (ref 0.61–1.24)
GFR calc non Af Amer: >60 mL/min (ref >60)
GLUCOSE: 125 mg/dL — AB (ref 65–99)
Potassium: 3.4 mmol/L — ABNORMAL LOW (ref 3.5–5.1)
Sodium: 136 mmol/L (ref 135–145)

## 2016-03-19 LAB — TROPONIN I
TROPONIN I: 0.09 ng/mL — AB (ref <0.03)
TROPONIN I: 0.2 ng/mL — AB (ref <0.03)
Troponin I: 0.03 ng/mL (ref <0.03)
Troponin I: 0.28 ng/mL (ref <0.03)
Troponin I: 0.17 ng/mL (ref <0.03)

## 2016-03-19 LAB — LIPID PANEL
Cholesterol: 115 mg/dL (ref 0–200)
HDL: 62 mg/dL (ref >40)
LDL CALC: 42 mg/dL (ref 0–99)
Total CHOL/HDL Ratio: 1.9 RATIO
Triglycerides: 55 mg/dL (ref <150)
VLDL: 11 mg/dL (ref 0–40)

## 2016-03-19 LAB — CBC WITH DIFFERENTIAL/PLATELET
Basophils Absolute: 0 10*3/uL (ref 0.0–0.1)
Basophils Relative: 0 %
Eosinophils Absolute: 0.1 10*3/uL (ref 0.0–0.7)
Eosinophils Relative: 0 %
HEMATOCRIT: 36.3 % — AB (ref 39.0–52.0)
HEMOGLOBIN: 12.2 g/dL — AB (ref 13.0–17.0)
LYMPHS ABS: 2.4 10*3/uL (ref 0.7–4.0)
LYMPHS PCT: 14 %
MCH: 29.6 pg (ref 26.0–34.0)
MCHC: 33.6 g/dL (ref 30.0–36.0)
MCV: 88.1 fL (ref 78.0–100.0)
MONOS PCT: 6 %
Monocytes Absolute: 1 10*3/uL (ref 0.1–1.0)
NEUTROS PCT: 80 %
Neutro Abs: 14.2 10*3/uL — ABNORMAL HIGH (ref 1.7–7.7)
Platelets: 137 10*3/uL — ABNORMAL LOW (ref 150–400)
RBC: 4.12 MIL/uL — AB (ref 4.22–5.81)
RDW: 14.1 % (ref 11.5–15.5)
WBC: 17.7 10*3/uL — AB (ref 4.0–10.5)

## 2016-03-19 LAB — GLUCOSE, CAPILLARY
GLUCOSE-CAPILLARY: 123 mg/dL — AB (ref 65–99)
GLUCOSE-CAPILLARY: 141 mg/dL — AB (ref 65–99)
GLUCOSE-CAPILLARY: 125 mg/dL — AB (ref 65–99)
GLUCOSE-CAPILLARY: 171 mg/dL — AB (ref 65–99)
GLUCOSE-CAPILLARY: 137 mg/dL — AB (ref 65–99)
Glucose-Capillary: 119 mg/dL — ABNORMAL HIGH (ref 65–99)
Glucose-Capillary: 124 mg/dL — ABNORMAL HIGH (ref 65–99)

## 2016-03-19 LAB — HEMOGLOBIN A1C
Hgb A1c MFr Bld: 10.9 % — ABNORMAL HIGH (ref 4.8–5.6)
Mean Plasma Glucose: 266 mg/dL

## 2016-03-19 LAB — LACTIC ACID, PLASMA
Lactic Acid, Venous: 1.6 mmol/L (ref 0.5–1.9)
Lactic Acid, Venous: 1.3 mmol/L (ref 0.5–1.9)

## 2016-03-19 LAB — MAGNESIUM: Magnesium: 1.6 mg/dL — ABNORMAL LOW (ref 1.7–2.4)

## 2016-03-19 MED ORDER — DILTIAZEM LOAD VIA INFUSION
10.0000 mg | Freq: Once | INTRAVENOUS | Status: DC
  Filled 2016-03-19: qty 10

## 2016-03-19 MED ORDER — METOPROLOL TARTRATE 5 MG/5ML IV SOLN
INTRAVENOUS | Status: AC
  Administered 2016-03-19: 5 mg
  Filled 2016-03-19: qty 5

## 2016-03-19 MED ORDER — MAGNESIUM SULFATE 50 % IJ SOLN
3.0000 g | Freq: Once | INTRAVENOUS | Status: AC
  Administered 2016-03-19: 3 g via INTRAVENOUS
  Filled 2016-03-19: qty 6

## 2016-03-19 MED ORDER — POTASSIUM CHLORIDE CRYS ER 20 MEQ PO TBCR
60.0000 meq | EXTENDED_RELEASE_TABLET | Freq: Once | ORAL | Status: AC
  Administered 2016-03-19: 60 meq via ORAL
  Filled 2016-03-19: qty 3

## 2016-03-19 MED ORDER — METOPROLOL TARTRATE 5 MG/5ML IV SOLN
5.0000 mg | Freq: Once | INTRAVENOUS | Status: AC
  Administered 2016-03-19: 5 mg via INTRAVENOUS
  Filled 2016-03-19: qty 5

## 2016-03-19 MED ORDER — METOPROLOL TARTRATE 5 MG/5ML IV SOLN
2.5000 mg | INTRAVENOUS | Status: DC | PRN
  Administered 2016-03-19: 5 mg via INTRAVENOUS
  Filled 2016-03-19: qty 5

## 2016-03-19 MED ORDER — DILTIAZEM HCL 100 MG IV SOLR
5.0000 mg/h | INTRAVENOUS | Status: DC
  Filled 2016-03-19: qty 100

## 2016-03-19 NOTE — Progress Notes (Signed)
PROGRESS NOTE    Derek Bass  [id number] DOB: 1940/07/29 DOA: 03/17/2016 PCP: [name], MD   Brief Narrative:   75 y.o. BM PMHx prostate cancer S/P seed implants in [place] followed by Dr. Gaynelle Arabian, HTN, HLD, DM type II uncontrolled with complications, Hepatitis (while in the service), Hypothyroidism, Chronic morbid obesity, Chronic lymphedema  Presented from home with confusion after daughter called EMS saying the patient does not seem to be like himself anemia survival patient was unable to stand up his lower extremities noted to be more swollen than usual vital signs blood pressure 97/54 heart rate 140 blood sugar over 306. Family states he was wheezing initially he had severe chills. Family denies any sick contacts. He was incontinent of urine. Denies chest pain.    Examination did not take his medications recently. He hasn't been following up with his primary PCP   Regarding pertinent Chronic problems: She has poorly controlled diabetes with noncompliance to diet.  Regarding chronic lymphedema uses lymphedema pump and dry J compression stockings    Subjective: 12/22  MAXIMUM TEMPERATURE last 24 hours 38.2C.  A/O 4, states does not remember speaking with me on 12/21 Sitting in bed comfortably eating red beans and rice from [employer].      Assessment & Plan:   Active Problems:   Diabetes type 2, uncontrolled (HCC)   HLD (hyperlipidemia)   HTN (hypertension)   Hypothyroidism   Morbid obesity with BMI of 50.0-59.9, adult (HCC)   Lymphedema   Sepsis ([place])   UTI (urinary tract infection)   Cardiomegaly   Sepsis/Positive ENTEROBACTER SPECIES UTI  -Normal saline 75 ml/hr -Continue current antibiotics until sensitivities return -Blood cultures NGTD. -Lactic acid normalized   Diabetes type 2, uncontrolled ([place]) -10/21 Hemoglobin A1c= 10.9 -Levemir 60 units daily -Moderate SSI  Cardiomegaly -Cardiac enzymes normalized. Most likely  secondary to demand ischemia -Echocardiogram confirmed cardiomegaly see results below. -Strict I&O -Daily weight  HTN -see tachycardic Sinus tachycardia -Metoprolol 5 mg QID PRN  -Metoprolol 25 mg BID (home dose 100 mg BID)  HLD  -Panel within ADA guideline -Lipitor 40 mg daily  Hypothyroidism  -TSH -Synthroid 200 g daily  Acute encephalopathy -Resolved    Hypokalemia -Potassium goal> 4 -K-Dur 60 mEq  Hypomagnesemia -Magnesium goal> 2 -Magnesium 3 g  Lymphedema/chronic venous stasis dermatitis -Wound Care consult for lymphedema pump     DVT prophylaxis: Heparin Code Status: Full Family Communication: None Disposition Plan: ? SNF   Consultants:    Procedures/Significant Events:  12/21 Echocardiogram: Left ventricle: severe focal basal hypertrophy of the septum with mild posterior wall   hypertrophy. -LVEF = 60% to 65%. - (grade 1 diastolic dysfunction). 12/21 bilateral lower extremity Doppler: Bilateral lower extremity venous duplex completed. There is no obvious evidence of deep or superficial vein thrombosis involving the right and left lower extremities. All clearly visualized vessels appear patent and compressible. There is no evidence of Baker's cysts bilaterally    VENTILATOR SETTINGS:     Cultures 12/20 blood culture 2 12/21 urine positive ENTEROBACTER SPECIES 12/21 MRSA by PCR negative      Antimicrobials: Anti-infectives    Start   Dose/Rate Route Frequency Stop   03/18/16 2200 cefTRIAXone (ROCEPHIN) 2 g in dextrose 5 % 50 mL IVPB     2 g 100 mL/hr over 30 Minutes Intravenous Every 24 hours     03/17/16 2345 cefTRIAXone (ROCEPHIN) 2 g in dextrose 5 % 50 mL IVPB     2 g 100 mL/hr  over 30 Minutes Intravenous  Once 03/18/16 0018       Devices    LINES / TUBES:      Continuous Infusions: . sodium chloride 75 mL/hr at 03/19/16 1539     Objective: Vitals:   03/19/16 1229 03/19/16 1302 03/19/16 1314 03/19/16 1716  BP:    117/69   Pulse:   (!) 111   Resp:   18   Temp: 98.9 F (37.2 C)  98.7 F (37.1 C) 98.1 F (36.7 C)  TempSrc: Oral  Oral Oral  SpO2:   97%   Weight:  (!) 192.8 kg (425 lb)    Height:  6\' 2"  (1.88 m)      Intake/Output Summary (Last 24 hours) at 03/19/16 1826 Last data filed at 03/19/16 1100  Gross per 24 hour  Intake             1540 ml  Output             1100 ml  Net              440 ml   Filed Weights   03/18/16 0053 03/19/16 1302  Weight: (!) 205.5 kg (453 lb) (!) 192.8 kg (425 lb)    Examination:  General: A/O 4, NAD, No acute respiratory distress Eyes: negative scleral hemorrhage, negative anisocoria, negative icterus ENT: Negative Runny nose, negative gingival bleeding, Neck:  Negative scars, masses, torticollis, lymphadenopathy, JVD Lungs: Clear to auscultation bilaterally without wheezes or crackles Cardiovascular: Regular rate and rhythm without murmur gallop or rub normal S1 and S2 Abdomen: MORBIDLY OBESE, negative, nondistended, positive soft, bowel sounds, no rebound, no ascites, no appreciable mass Extremities: positive significant cyanosis, clubbing, positive edema bilateral lower extremities Skin: Negative rashes, lesions, ulcers Psychiatric:  Negative depression, negative anxiety, negative fatigue, negative mania  Central nervous system:  Cranial nerves II through XII intact, tongue/uvula midline, all extremities muscle strength 5/5, sensation intact throughout, negative dysarthria, negative expressive aphasia, negative receptive aphasia.  .     Data Reviewed: Care during the described time interval was provided by me .  I have reviewed this patient's available data, including medical history, events of note, physical examination, and all test results as part of my evaluation. I have personally reviewed and interpreted all radiology studies.  CBC:  Recent Labs Lab 03/17/16 2104 03/18/16 0326 03/19/16 0423  WBC 17.1* 17.1* 17.7*  NEUTROABS 15.5*   --  14.2*  HGB 15.0 12.9* 12.2*  HCT 44.9 38.0* 36.3*  MCV 90.7 88.4 88.1  PLT 150 148* 0000000*   Basic Metabolic Panel:  Recent Labs Lab 03/17/16 2104 03/18/16 0326 03/19/16 0423  NA 138 138 136  K 4.6 4.2 3.4*  CL 102 106 104  CO2 23 24 25   GLUCOSE 282* 270* 125*  BUN 10 13 14   CREATININE 1.65* 1.27* 0.90  CALCIUM 9.6 8.7* 8.6*  MG  --  1.4* 1.6*  PHOS  --  2.9  --    GFR: Estimated Creatinine Clearance: 128.7 mL/min (by C-G formula based on SCr of 0.9 mg/dL). Liver Function Tests:  Recent Labs Lab 03/17/16 2104 03/18/16 0326  AST 41 34  ALT 32 26  ALKPHOS 72 58  BILITOT 1.7* 1.1  PROT 7.2 6.2*  ALBUMIN 3.4* 2.8*   No results for input(s): LIPASE, AMYLASE in the last 168 hours. No results for input(s): AMMONIA in the last 168 hours. Coagulation Profile:  Recent Labs Lab 03/18/16 0326  INR 1.17   Cardiac Enzymes:  Recent Labs Lab 03/19/16 0423 03/19/16 0714 03/19/16 0955 03/19/16 1250 03/19/16 1510  TROPONINI 0.09* 0.28* 0.20* 0.17* <0.03   BNP (last 3 results) No results for input(s): PROBNP in the last 8760 hours. HbA1C:  Recent Labs  03/18/16 0326  HGBA1C 10.9*   CBG:  Recent Labs Lab 03/19/16 0408 03/19/16 0845 03/19/16 1228 03/19/16 1457 03/19/16 1532  GLUCAP 123* 119* 141* 124* 125*   Lipid Profile:  Recent Labs  03/19/16 0423  CHOL 115  HDL 62  LDLCALC 42  TRIG 55  CHOLHDL 1.9   Thyroid Function Tests:  Recent Labs  03/18/16 0326  TSH 1.918   Anemia Panel: No results for input(s): VITAMINB12, FOLATE, FERRITIN, TIBC, IRON, RETICCTPCT in the last 72 hours. Urine analysis:    Component Value Date/Time   COLORURINE AMBER (A) 03/17/2016 2306   APPEARANCEUR HAZY (A) 03/17/2016 2306   LABSPEC 1.016 03/17/2016 2306   PHURINE 5.0 03/17/2016 2306   GLUCOSEU >=500 (A) 03/17/2016 2306   HGBUR LARGE (A) 03/17/2016 2306   HGBUR Neg 04/13/2010 1120   BILIRUBINUR NEGATIVE 03/17/2016 2306   BILIRUBINUR neg 09/19/2012  1125   KETONESUR 5 (A) 03/17/2016 2306   PROTEINUR 30 (A) 03/17/2016 2306   UROBILINOGEN 0.2 09/19/2012 1125   UROBILINOGEN 1.0 04/13/2010 1120   NITRITE NEGATIVE 03/17/2016 2306   LEUKOCYTESUR MODERATE (A) 03/17/2016 2306   Sepsis Labs: @LABRCNTIP (procalcitonin:4,lacticidven:4)  ) Recent Results (from the past 240 hour(s))  Blood culture (routine x 2)     Status: None (Preliminary result)   Collection Time: 03/17/16 10:32 PM  Result Value Ref Range Status   Specimen Description BLOOD RIGHT HAND  Final   Special Requests BOTTLES DRAWN AEROBIC AND ANAEROBIC 5ML  Final   Culture NO GROWTH 2 DAYS  Final   Report Status PENDING  Incomplete  Blood culture (routine x 2)     Status: None (Preliminary result)   Collection Time: 03/17/16 10:46 PM  Result Value Ref Range Status   Specimen Description BLOOD RIGHT WRIST  Final   Special Requests IN PEDIATRIC BOTTLE 1ML  Final   Culture NO GROWTH 2 DAYS  Final   Report Status PENDING  Incomplete  Urine culture     Status: Abnormal (Preliminary result)   Collection Time: 03/18/16 12:45 AM  Result Value Ref Range Status   Specimen Description URINE, RANDOM  Final   Special Requests NONE  Final   Culture (A)  Final    >=100,000 COLONIES/mL ENTEROBACTER SPECIES SUSCEPTIBILITIES TO FOLLOW    Report Status PENDING  Incomplete  MRSA PCR Screening     Status: None   Collection Time: 03/18/16  4:50 AM  Result Value Ref Range Status   MRSA by PCR NEGATIVE NEGATIVE Final    Comment:        The GeneXpert MRSA Assay (FDA approved for NASAL specimens only), is one component of a comprehensive MRSA colonization surveillance program. It is not intended to diagnose MRSA infection nor to guide or monitor treatment for MRSA infections.   Respiratory Panel by PCR     Status: None   Collection Time: 03/18/16  6:00 AM  Result Value Ref Range Status   Adenovirus NOT DETECTED NOT DETECTED Final   Coronavirus 229E NOT DETECTED NOT DETECTED Final    Coronavirus HKU1 NOT DETECTED NOT DETECTED Final   Coronavirus NL63 NOT DETECTED NOT DETECTED Final   Coronavirus OC43 NOT DETECTED NOT DETECTED Final   Metapneumovirus NOT DETECTED NOT DETECTED Final   Rhinovirus /  Enterovirus NOT DETECTED NOT DETECTED Final   Influenza A NOT DETECTED NOT DETECTED Final   Influenza B NOT DETECTED NOT DETECTED Final   Parainfluenza Virus 1 NOT DETECTED NOT DETECTED Final   Parainfluenza Virus 2 NOT DETECTED NOT DETECTED Final   Parainfluenza Virus 3 NOT DETECTED NOT DETECTED Final   Parainfluenza Virus 4 NOT DETECTED NOT DETECTED Final   Respiratory Syncytial Virus NOT DETECTED NOT DETECTED Final   Bordetella pertussis NOT DETECTED NOT DETECTED Final   Chlamydophila pneumoniae NOT DETECTED NOT DETECTED Final   Mycoplasma pneumoniae NOT DETECTED NOT DETECTED Final         Radiology Studies: Dg Chest 2 View  Result Date: 03/17/2016 CLINICAL DATA:  Initial e acute altered mental status. Valuation for EXAM: CHEST  2 VIEW COMPARISON:  None available. FINDINGS: Mild to moderate cardiomegaly. Mediastinal silhouette within normal limits. Lungs are mildly hypoinflated. There is diffuse vascular congestion with interstitial prominence, suggesting mild pulmonary interstitial edema. No definite pleural effusion, although evaluation somewhat limited by patient body habitus. No consolidative airspace disease. No pneumothorax. No acute osseous abnormality. IMPRESSION: Cardiomegaly with diffuse pulmonary vascular congestion and interstitial prominence, suggesting mild diffuse pulmonary interstitial edema. Electronically Signed   By: Jeannine Boga M.D.   On: 03/17/2016 21:27   Ct Head Wo Contrast  Result Date: 03/17/2016 CLINICAL DATA:  Altered mental status EXAM: CT HEAD WITHOUT CONTRAST TECHNIQUE: Contiguous axial images were obtained from the base of the skull through the vertex without intravenous contrast. COMPARISON:  None. FINDINGS: Brain: No mass lesion,  intraparenchymal hemorrhage or extra-axial collection. No evidence of acute cortical infarct. There is moderate atrophy. There is periventricular hypoattenuation compatible with chronic microvascular disease. Vascular: No hyperdense vessel or unexpected calcification. Skull: Normal visualized skull base, calvarium and extracranial soft tissues. Sinuses/Orbits: No sinus fluid levels or advanced mucosal thickening. No mastoid effusion. Normal orbits. IMPRESSION: 1. No acute intracranial abnormality. 2. Moderate atrophy and chronic microvascular ischemia. Electronically Signed   By: Ulyses Jarred M.D.   On: 03/17/2016 21:47   US Renal  Result Date: 03/18/2016 CLINICAL DATA:  75 year old male with hematuria, UTI, and sepsis. EXAM: RENAL / URINARY TRACT ULTRASOUND COMPLETE COMPARISON:  None. FINDINGS: Evaluation of this exam is limited due to patient's body habitus. Right Kidney: Length: 12.1 cm.  No hydronephrosis or echogenic stone. Left Kidney: Length: 12.1 cm.  No hydronephrosis or echogenic stone. Bladder: Appears normal for degree of bladder distention. IMPRESSION: Unremarkable renal ultrasound on limited evaluation. Electronically Signed   By: Anner Crete M.D.   On: 03/18/2016 04:38        Scheduled Meds: . aspirin  325 mg Oral Daily  . atorvastatin  40 mg Oral q1800  . cefTRIAXone (ROCEPHIN)  IV  2 g Intravenous Q24H  . guaiFENesin  600 mg Oral BID  . heparin  5,000 Units Subcutaneous Q8H  . insulin aspart  0-15 Units Subcutaneous Q4H  . insulin detemir  60 Units Subcutaneous Daily  . levothyroxine  200 mcg Oral QAC breakfast  . senna  1 tablet Oral BID  . sodium chloride flush  3 mL Intravenous Q12H  . tamsulosin  0.4 mg Oral Daily   Continuous Infusions: . sodium chloride 75 mL/hr at 03/19/16 1539     LOS: 1 day    Time spent: 40 minutes    Callum Wolf, Geraldo Docker, MD [facility] Pager [phone]   If 7PM-7AM, please contact night-coverage [email or web address] Password  [facility] 03/19/2016, 6:26 PM

## 2016-03-19 NOTE — Care Management Note (Signed)
Case Management Note  Patient Details  Name: ARKELL FEIGE MRN: [id number] Date of Birth: [birth year]  Subjective/Objective:    Pt lives alone, uses cane for ambulation, PCP is Dr Danise Mina @ [address]  States he was independent PTA . Had previously been using lymphedema pump which had decreased the edema in his (R) leg and he had become more mobile but stopped regular treatments and was only using pump sporadically, became weaker and less mobile.  PT/OT evals pending.              Expected Discharge Plan:  [place]  In-House Referral:  Clinical Social Work  Discharge planning Services  CM Consult  Status of Service:  In process, will continue to follow  Girard Cooter, RN 03/19/2016, 3:42 PM

## 2016-03-19 NOTE — Progress Notes (Signed)
[facility] Progress Note Patient Name: Derek Bass DOB: June 28, 1940 MRN: [id number]   Date of Service  03/19/2016  HPI/Events of Note  Call from nurse reporting tachycardia with rates ranging from the 140s to 150s.  Concern for AF/RVR.  No documented history.  HD stable.  eICU Interventions  Plan: PRN lopressor for now Cycle cardiac enzymes 12 lead EKG Nurse to continue to contact primary MD     Intervention Category Intermediate Interventions: Arrhythmia - evaluation and management  DETERDING,ELIZABETH 03/19/2016, 12:27 AM

## 2016-03-19 NOTE — Progress Notes (Signed)
03/19/2016 Patient transfer from [place] to 4E16 at 1345. He is alert, oriented and weak. Patient have swelling in bilateral legs and he have Lymphedema in the right legs. The right red and swollen and warm to touch the left is also swollen. The left great toe and second toe have scab. Sacrum no soar noted.  Patient receive CHG bath, Elink and Central monitoring was made aware patient is on unit. Jarris Kortz WellingtonRN

## 2016-03-19 NOTE — Progress Notes (Signed)
MD paged 4 times without response. Patient noted to have inconclusive tachycardia/SVT/Afib w/ RVR.  Called Elink.  Order for metoprolol.  Given instructions to continue to monitor and contact primary.  Clyda Hurdle RN

## 2016-03-20 DIAGNOSIS — Z9114 Patient's other noncompliance with medication regimen: Secondary | ICD-10-CM

## 2016-03-20 DIAGNOSIS — B9689 Other specified bacterial agents as the cause of diseases classified elsewhere: Secondary | ICD-10-CM

## 2016-03-20 DIAGNOSIS — N3001 Acute cystitis with hematuria: Secondary | ICD-10-CM

## 2016-03-20 DIAGNOSIS — E038 Other specified hypothyroidism: Secondary | ICD-10-CM

## 2016-03-20 DIAGNOSIS — A498 Other bacterial infections of unspecified site: Secondary | ICD-10-CM

## 2016-03-20 DIAGNOSIS — Z91148 Patient's other noncompliance with medication regimen for other reason: Secondary | ICD-10-CM

## 2016-03-20 LAB — URINE CULTURE: Culture: >=100000 — AB

## 2016-03-20 LAB — CBC WITH DIFFERENTIAL/PLATELET
Basophils Absolute: 0 10*3/uL (ref 0.0–0.1)
Basophils Relative: 0 %
EOS PCT: 1 %
Eosinophils Absolute: 0.2 10*3/uL (ref 0.0–0.7)
HCT: 35.3 % — ABNORMAL LOW (ref 39.0–52.0)
Hemoglobin: 11.9 g/dL — ABNORMAL LOW (ref 13.0–17.0)
LYMPHS ABS: 2 10*3/uL (ref 0.7–4.0)
LYMPHS PCT: 13 %
MCH: 30.1 pg (ref 26.0–34.0)
MCHC: 33.7 g/dL (ref 30.0–36.0)
MCV: 89.4 fL (ref 78.0–100.0)
Monocytes Absolute: 1 10*3/uL (ref 0.1–1.0)
Monocytes Relative: 7 %
Neutro Abs: 12.5 10*3/uL — ABNORMAL HIGH (ref 1.7–7.7)
Neutrophils Relative %: 79 %
PLATELETS: 144 10*3/uL — AB (ref 150–400)
RBC: 3.95 MIL/uL — AB (ref 4.22–5.81)
RDW: 14.2 % (ref 11.5–15.5)
WBC: 15.7 10*3/uL — AB (ref 4.0–10.5)

## 2016-03-20 LAB — TROPONIN I
TROPONIN I: 0.16 ng/mL — AB (ref <0.03)
Troponin I: 0.14 ng/mL (ref <0.03)
Troponin I: 0.06 ng/mL (ref <0.03)
Troponin I: 0.51 ng/mL (ref <0.03)
Troponin I: 0.06 ng/mL (ref <0.03)

## 2016-03-20 LAB — GLUCOSE, CAPILLARY
GLUCOSE-CAPILLARY: 120 mg/dL — AB (ref 65–99)
GLUCOSE-CAPILLARY: 134 mg/dL — AB (ref 65–99)
Glucose-Capillary: 137 mg/dL — ABNORMAL HIGH (ref 65–99)
Glucose-Capillary: 154 mg/dL — ABNORMAL HIGH (ref 65–99)
Glucose-Capillary: 141 mg/dL — ABNORMAL HIGH (ref 65–99)
Glucose-Capillary: 159 mg/dL — ABNORMAL HIGH (ref 65–99)
Glucose-Capillary: 173 mg/dL — ABNORMAL HIGH (ref 65–99)

## 2016-03-20 LAB — BASIC METABOLIC PANEL
Anion gap: 5 (ref 5–15)
BUN: 11 mg/dL (ref 6–20)
CHLORIDE: 106 mmol/L (ref 101–111)
CO2: 25 mmol/L (ref 22–32)
CREATININE: 0.8 mg/dL (ref 0.61–1.24)
Calcium: 8.5 mg/dL — ABNORMAL LOW (ref 8.9–10.3)
GFR calc Af Amer: >60 mL/min (ref >60)
GFR calc non Af Amer: >60 mL/min (ref >60)
GLUCOSE: 131 mg/dL — AB (ref 65–99)
Potassium: 3.7 mmol/L (ref 3.5–5.1)
Sodium: 136 mmol/L (ref 135–145)

## 2016-03-20 LAB — MAGNESIUM: Magnesium: 2 mg/dL (ref 1.7–2.4)

## 2016-03-20 LAB — TSH: TSH: 3.482 u[IU]/mL (ref 0.350–4.500)

## 2016-03-20 MED ORDER — GLUCERNA SHAKE PO LIQD
237.0000 mL | Freq: Three times a day (TID) | ORAL | Status: DC
  Administered 2016-03-21 – 2016-03-23 (×6): 237 mL via ORAL

## 2016-03-20 MED ORDER — MAGNESIUM CITRATE PO SOLN
0.5000 | Freq: Once | ORAL | Status: AC
  Administered 2016-03-20: 0.5 via ORAL
  Filled 2016-03-20: qty 296

## 2016-03-20 MED ORDER — METOPROLOL TARTRATE 25 MG PO TABS
25.0000 mg | ORAL_TABLET | Freq: Two times a day (BID) | ORAL | Status: DC
  Administered 2016-03-20 (×2): 25 mg via ORAL
  Filled 2016-03-20 (×2): qty 1

## 2016-03-20 NOTE — Consult Note (Signed)
Lymphedema pumps are only place in the outpatient setting.  Would suggest appt for patient in an outpatient lymphedema clinic and CM to contact [place] for pump evaluation as outpatient. Alexzander Dolinger Liane Comber MSN, RN, [employer],  CNS

## 2016-03-20 NOTE — Progress Notes (Signed)
PROGRESS NOTE    Derek Bass  [id number] DOB: 08-06-1940 DOA: 03/17/2016 PCP: [name], MD   Brief Narrative:   75 y.o. BM PMHx prostate cancer S/P seed implants in [place] followed by Dr. Gaynelle Arabian, HTN, HLD, DM type II uncontrolled with complications, Hepatitis (while in the service), Hypothyroidism, Chronic morbid obesity, Chronic lymphedema  Presented from home with confusion after daughter called EMS saying the patient does not seem to be like himself anemia survival patient was unable to stand up his lower extremities noted to be more swollen than usual vital signs blood pressure 97/54 heart rate 140 blood sugar over 306. Family states he was wheezing initially he had severe chills. Family denies any sick contacts. He was incontinent of urine. Denies chest pain.    Examination did not take his medications recently. He hasn't been following up with his primary PCP   Regarding pertinent Chronic problems: She has poorly controlled diabetes with noncompliance to diet.  Regarding chronic lymphedema uses lymphedema pump and dry J compression stockings    Subjective: 12/23 last 24 hours afebrile A/O 4. Patient sitting comfortably in bed stating feels significantly improved. Would like to get up to chair today. Possibly try to ambulate. Currently negative CP negative SOB.        Assessment & Plan:   Active Problems:   Diabetes type 2, uncontrolled ([place])   HLD (hyperlipidemia)   HTN (hypertension)   Hypothyroidism   Morbid obesity with BMI of 50.0-59.9, adult (HCC)   Lymphedema   Sepsis ([place])   UTI (urinary tract infection)   Cardiomegaly   Acute cystitis with hematuria   Chronic venous stasis dermatitis   Noncompliance with medications   Sepsis/Positive ENTEROBACTER SPECIES UTI  -Normal saline 75 ml/hr -Continue current antibiotics  -Blood cultures NGTD. -Lactic acid normalized   Diabetes type 2, uncontrolled ([place]) -10/21 Hemoglobin A1c=  10.9 -Levemir 60 units daily -Moderate SSI  Cardiomegaly -Troponins have been trending them. However overnight slight trend up. However patient asymptomatic. Still believed secondary to demand ischemia but will trend troponins. Obtain EKG. If returning up and EKG changes will obtain cardiology consult. -Echocardiogram confirmed cardiomegaly see results below. -Strict I&O since admission +1.9 L -Daily weight Filed Weights   03/18/16 0053 03/19/16 1302 03/20/16 0438  Weight: (!) 205.5 kg (453 lb) (!) 192.8 kg (425 lb) (!) 193.2 kg (426 lb)   HTN -see tachycardic Sinus tachycardia -Metoprolol 5 mg QID PRN  -Metoprolol 25 mg BID (home dose 100 mg BID)  HLD  -Panel within ADA guideline -Lipitor 40 mg daily  Hypothyroidism  -TSH -Synthroid 200 g daily  Acute encephalopathy -Resolved  Hypokalemia -Potassium goal> 4  Hypomagnesemia -Magnesium goal> 2  Lymphedema/chronic venous stasis dermatitis -Wound Care consult for lymphedema pump  Constipation -1/2 bottle magnesium citrate. May repeat if no BM in 6 hours    DVT prophylaxis: Heparin Code Status: Full Family Communication: None Disposition Plan: ? SNF   Consultants:    Procedures/Significant Events:  12/21 Echocardiogram: Left ventricle: severe focal basal hypertrophy of the septum with mild posterior wall   hypertrophy. -LVEF = 60% to 65%. - (grade 1 diastolic dysfunction). 12/21 bilateral lower extremity Doppler: Bilateral lower extremity venous duplex completed. There is no obvious evidence of deep or superficial vein thrombosis involving the right and left lower extremities. All clearly visualized vessels appear patent and compressible. There is no evidence of Baker's cysts bilaterally    VENTILATOR SETTINGS:     Cultures 12/20 blood culture 2 12/21  urine positive ENTEROBACTER SPECIES 12/21 MRSA by PCR negative      Antimicrobials: Anti-infectives    Start   Dose/Rate Route Frequency Stop     03/18/16 2200 cefTRIAXone (ROCEPHIN) 2 g in dextrose 5 % 50 mL IVPB     2 g 100 mL/hr over 30 Minutes Intravenous Every 24 hours     03/17/16 2345 cefTRIAXone (ROCEPHIN) 2 g in dextrose 5 % 50 mL IVPB     2 g 100 mL/hr over 30 Minutes Intravenous  Once 03/18/16 0018       Devices    LINES / TUBES:      Continuous Infusions: . sodium chloride 75 mL/hr at 03/20/16 0944     Objective: Vitals:   03/20/16 0349 03/20/16 0400 03/20/16 0438 03/20/16 0836  BP:  127/67  (!) 124/57  Pulse:  (!) 116  (!) 107  Resp:  (!) 23  18  Temp: 99 F (37.2 C)   98.1 F (36.7 C)  TempSrc: Oral   Oral  SpO2:  95%  96%  Weight:   (!) 193.2 kg (426 lb)   Height:        Intake/Output Summary (Last 24 hours) at 03/20/16 1121 Last data filed at 03/20/16 0700  Gross per 24 hour  Intake             1550 ml  Output             1100 ml  Net              450 ml   Filed Weights   03/18/16 0053 03/19/16 1302 03/20/16 0438  Weight: (!) 205.5 kg (453 lb) (!) 192.8 kg (425 lb) (!) 193.2 kg (426 lb)    Examination:  General: A/O 4, NAD, No acute respiratory distress Eyes: negative scleral hemorrhage, negative anisocoria, negative icterus ENT: Negative Runny nose, negative gingival bleeding, Neck:  Negative scars, masses, torticollis, lymphadenopathy, JVD Lungs: Clear to auscultation bilaterally without wheezes or crackles Cardiovascular: Regular rate and rhythm without murmur gallop or rub normal S1 and S2 Abdomen: MORBIDLY OBESE, negative, nondistended, positive soft, bowel sounds, no rebound, no ascites, no appreciable mass Extremities: positive significant cyanosis, clubbing, positive edema bilateral lower extremities Skin: Negative rashes, lesions, ulcers Psychiatric:  Negative depression, negative anxiety, negative fatigue, negative mania  Central nervous system:  Cranial nerves II through XII intact, tongue/uvula midline, all extremities muscle strength 5/5, sensation intact  throughout, negative dysarthria, negative expressive aphasia, negative receptive aphasia.  .     Data Reviewed: Care during the described time interval was provided by me .  I have reviewed this patient's available data, including medical history, events of note, physical examination, and all test results as part of my evaluation. I have personally reviewed and interpreted all radiology studies.  CBC:  Recent Labs Lab 03/17/16 2104 03/18/16 0326 03/19/16 0423 03/20/16 0200  WBC 17.1* 17.1* 17.7* 15.7*  NEUTROABS 15.5*  --  14.2* 12.5*  HGB 15.0 12.9* 12.2* 11.9*  HCT 44.9 38.0* 36.3* 35.3*  MCV 90.7 88.4 88.1 89.4  PLT 150 148* 137* [id number]*   Basic Metabolic Panel:  Recent Labs Lab 03/17/16 2104 03/18/16 0326 03/19/16 0423 03/20/16 0200  NA 138 138 136 136  K 4.6 4.2 3.4* 3.7  CL 102 106 104 106  CO2 23 24 25 25   GLUCOSE 282* 270* 125* 131*  BUN 10 13 14 11   CREATININE 1.65* 1.27* 0.90 0.80  CALCIUM 9.6 8.7* 8.6* 8.5*  MG  --  1.4* 1.6* 2.0  PHOS  --  2.9  --   --    GFR: Estimated Creatinine Clearance: 145.1 mL/min (by C-G formula based on SCr of 0.8 mg/dL). Liver Function Tests:  Recent Labs Lab 03/17/16 2104 03/18/16 0326  AST 41 34  ALT 32 26  ALKPHOS 72 58  BILITOT 1.7* 1.1  PROT 7.2 6.2*  ALBUMIN 3.4* 2.8*   No results for input(s): LIPASE, AMYLASE in the last 168 hours. No results for input(s): AMMONIA in the last 168 hours. Coagulation Profile:  Recent Labs Lab 03/18/16 0326  INR 1.17   Cardiac Enzymes:  Recent Labs Lab 03/19/16 0955 03/19/16 1250 03/19/16 1510 03/19/16 2248 03/20/16 0200  TROPONINI 0.20* 0.17* <0.03 0.14* 0.06*   BNP (last 3 results) No results for input(s): PROBNP in the last 8760 hours. HbA1C:  Recent Labs  03/18/16 0326  HGBA1C 10.9*   CBG:  Recent Labs Lab 03/19/16 1532 03/19/16 2023 03/19/16 2312 03/20/16 0348 03/20/16 0835  GLUCAP 125* 171* 137* 120* 137*   Lipid Profile:  Recent Labs   03/19/16 0423  CHOL 115  HDL 62  LDLCALC 42  TRIG 55  CHOLHDL 1.9   Thyroid Function Tests:  Recent Labs  03/20/16 0200  TSH 3.482   Anemia Panel: No results for input(s): VITAMINB12, FOLATE, FERRITIN, TIBC, IRON, RETICCTPCT in the last 72 hours. Urine analysis:    Component Value Date/Time   COLORURINE AMBER (A) 03/17/2016 2306   APPEARANCEUR HAZY (A) 03/17/2016 2306   LABSPEC 1.016 03/17/2016 2306   PHURINE 5.0 03/17/2016 2306   GLUCOSEU >=500 (A) 03/17/2016 2306   HGBUR LARGE (A) 03/17/2016 2306   HGBUR Neg 04/13/2010 1120   BILIRUBINUR NEGATIVE 03/17/2016 2306   BILIRUBINUR neg 09/19/2012 1125   KETONESUR 5 (A) 03/17/2016 2306   PROTEINUR 30 (A) 03/17/2016 2306   UROBILINOGEN 0.2 09/19/2012 1125   UROBILINOGEN 1.0 04/13/2010 1120   NITRITE NEGATIVE 03/17/2016 2306   LEUKOCYTESUR MODERATE (A) 03/17/2016 2306   Sepsis Labs: @LABRCNTIP (procalcitonin:4,lacticidven:4)  ) Recent Results (from the past 240 hour(s))  Blood culture (routine x 2)     Status: None (Preliminary result)   Collection Time: 03/17/16 10:32 PM  Result Value Ref Range Status   Specimen Description BLOOD RIGHT HAND  Final   Special Requests BOTTLES DRAWN AEROBIC AND ANAEROBIC 5ML  Final   Culture NO GROWTH 2 DAYS  Final   Report Status PENDING  Incomplete  Blood culture (routine x 2)     Status: None (Preliminary result)   Collection Time: 03/17/16 10:46 PM  Result Value Ref Range Status   Specimen Description BLOOD RIGHT WRIST  Final   Special Requests IN PEDIATRIC BOTTLE 1ML  Final   Culture NO GROWTH 2 DAYS  Final   Report Status PENDING  Incomplete  Urine culture     Status: Abnormal   Collection Time: 03/18/16 12:45 AM  Result Value Ref Range Status   Specimen Description URINE, RANDOM  Final   Special Requests NONE  Final   Culture >=100,000 COLONIES/mL ENTEROBACTER SPECIES (A)  Final   Report Status 03/20/2016 FINAL  Final   Organism ID, Bacteria ENTEROBACTER SPECIES (A)  Final       Susceptibility   Enterobacter species - MIC*    CEFAZOLIN <=4 RESISTANT Resistant     CEFTRIAXONE <=1 SENSITIVE Sensitive     CIPROFLOXACIN <=0.25 SENSITIVE Sensitive     GENTAMICIN <=1 SENSITIVE Sensitive     IMIPENEM <=0.25 SENSITIVE Sensitive  NITROFURANTOIN 64 INTERMEDIATE Intermediate     TRIMETH/SULFA <=20 SENSITIVE Sensitive     PIP/TAZO <=4 SENSITIVE Sensitive     * >=100,000 COLONIES/mL ENTEROBACTER SPECIES  MRSA PCR Screening     Status: None   Collection Time: 03/18/16  4:50 AM  Result Value Ref Range Status   MRSA by PCR NEGATIVE NEGATIVE Final    Comment:        The GeneXpert MRSA Assay (FDA approved for NASAL specimens only), is one component of a comprehensive MRSA colonization surveillance program. It is not intended to diagnose MRSA infection nor to guide or monitor treatment for MRSA infections.   Respiratory Panel by PCR     Status: None   Collection Time: 03/18/16  6:00 AM  Result Value Ref Range Status   Adenovirus NOT DETECTED NOT DETECTED Final   Coronavirus 229E NOT DETECTED NOT DETECTED Final   Coronavirus HKU1 NOT DETECTED NOT DETECTED Final   Coronavirus NL63 NOT DETECTED NOT DETECTED Final   Coronavirus OC43 NOT DETECTED NOT DETECTED Final   Metapneumovirus NOT DETECTED NOT DETECTED Final   Rhinovirus / Enterovirus NOT DETECTED NOT DETECTED Final   Influenza A NOT DETECTED NOT DETECTED Final   Influenza B NOT DETECTED NOT DETECTED Final   Parainfluenza Virus 1 NOT DETECTED NOT DETECTED Final   Parainfluenza Virus 2 NOT DETECTED NOT DETECTED Final   Parainfluenza Virus 3 NOT DETECTED NOT DETECTED Final   Parainfluenza Virus 4 NOT DETECTED NOT DETECTED Final   Respiratory Syncytial Virus NOT DETECTED NOT DETECTED Final   Bordetella pertussis NOT DETECTED NOT DETECTED Final   Chlamydophila pneumoniae NOT DETECTED NOT DETECTED Final   Mycoplasma pneumoniae NOT DETECTED NOT DETECTED Final         Radiology Studies: No results  found.      Scheduled Meds: . aspirin  325 mg Oral Daily  . atorvastatin  40 mg Oral q1800  . cefTRIAXone (ROCEPHIN)  IV  2 g Intravenous Q24H  . guaiFENesin  600 mg Oral BID  . heparin  5,000 Units Subcutaneous Q8H  . insulin aspart  0-15 Units Subcutaneous Q4H  . insulin detemir  60 Units Subcutaneous Daily  . levothyroxine  200 mcg Oral QAC breakfast  . senna  1 tablet Oral BID  . sodium chloride flush  3 mL Intravenous Q12H  . tamsulosin  0.4 mg Oral Daily   Continuous Infusions: . sodium chloride 75 mL/hr at 03/20/16 0944     LOS: 2 days    Time spent: 40 minutes    WOODS, Geraldo Docker, MD [facility] Pager [phone]   If 7PM-7AM, please contact night-coverage [email or web address] Password [facility] 03/20/2016, 11:21 AM

## 2016-03-20 NOTE — Progress Notes (Signed)
CRITICAL VALUE ALERT  Critical value received:  Troponin 0.14  Date of notification:  03/20/2016  Time of notification: 0004  Critical value read back: yes  Nurse who received alert:  Brien Mates  MD notified (1st page):  Walden Field MD  Time of first page:  03:42  MD notified (2nd page):  Time of second page:  Responding MD:  Walden Field MD  Time MD responded:  03:47

## 2016-03-20 NOTE — Evaluation (Signed)
Physical Therapy Evaluation Patient Details Name: Derek Bass MRN: [id number] DOB: 01/04/1941 Today's Date: 03/20/2016   History of Present Illness  Pt is a 75 y/o male admitted secondary to confusion and inability to get out of bed secondary to increased edema in bilateral LEs. PMH including but not limited to chronic lymphedema, chronic morbid obesity, HTN, and poorly controlled DM.  Clinical Impression  Pt presented supine in bed with HOB elevated, awake and willing to participate in therapy session. Prior to admission, pt reported that he was mod I for functional mobility with use of SPC to ambulate. Pt lives alone but stated that his daughters and ex-wife visit him frequently. Pt currently requires max A for bed mobility. Pt very limited this session secondary to fatigue, weakness and pain in R LE. Pt would continue to benefit from skilled physical therapy services at this time while admitted and after d/c to address his below listed limitations in order to improve his overall safety and independence with functional mobility.      Follow Up Recommendations SNF;Supervision/Assistance - 24 hour    Equipment Recommendations  None recommended by PT    Recommendations for Other Services       Precautions / Restrictions Precautions Precautions: Fall Restrictions Weight Bearing Restrictions: No      Mobility  Bed Mobility Overal bed mobility: Needs Assistance Bed Mobility: Rolling Rolling: Max assist         General bed mobility comments: pt was able to assist with bilateral UEs, max A provided at pelvis, bilateral LEs and use of bed pads  Transfers                 General transfer comment: did not attempt as it was not safe for pt or therapist as pt required max A for rolling and only one person to assist  Ambulation/Gait                Stairs            Wheelchair Mobility    Modified Rankin (Stroke Patients Only)       Balance                                              Pertinent Vitals/Pain Pain Assessment: No/denies pain    Home Living Family/patient expects to be discharged to:: Private residence Living Arrangements: Alone Available Help at Discharge: Family;Available PRN/intermittently   Home Access: Level entry     Home Layout: Two level;Able to live on main level with bedroom/bathroom Home Equipment: Kasandra Knudsen - single point      Prior Function Level of Independence: Independent with assistive device(s)         Comments: pt reported that he ambulates with use of SPC     Hand Dominance        Extremity/Trunk Assessment   Upper Extremity Assessment Upper Extremity Assessment: Generalized weakness    Lower Extremity Assessment Lower Extremity Assessment: Generalized weakness;RLE deficits/detail;LLE deficits/detail RLE Deficits / Details: pt unable to lift R LE off of support surface against gravity without physical assistance. Pt with significant edema in R lower leg, ankle and foot. Pt also reported pain with palpation of distal, dorsal aspect of foot. LLE Deficits / Details: pt was able to lift L LE off of support surface through partial ROM at hip against gravity. Mild edema observed  in foot and ankle.       Communication   Communication: No difficulties  Cognition Arousal/Alertness: Awake/alert Behavior During Therapy: WFL for tasks assessed/performed (tearful) Overall Cognitive Status: No family/caregiver present to determine baseline cognitive functioning Area of Impairment: Orientation;Memory Orientation Level: Disoriented to;Time;Situation   Memory: Decreased short-term memory         General Comments: pt became very tearful when therapist was re-orienting him to time and day.    General Comments      Exercises General Exercises - Lower Extremity Short Arc QuadSinclair Ship;Right;5 reps;Supine Heel Slides: AAROM;Right;5 reps   Assessment/Plan    PT Assessment  Patient needs continued PT services  PT Problem List Decreased strength;Decreased range of motion;Decreased activity tolerance;Decreased balance;Decreased mobility;Decreased coordination;Decreased cognition;Decreased knowledge of use of DME;Decreased safety awareness;Cardiopulmonary status limiting activity;Obesity;Pain          PT Treatment Interventions DME instruction;Gait training;Stair training;Functional mobility training;Therapeutic activities;Therapeutic exercise;Balance training;Neuromuscular re-education;Patient/family education    PT Goals (Current goals can be found in the Care Plan section)  Acute Rehab PT Goals Patient Stated Goal: to decrease swelling in R LE PT Goal Formulation: With patient Time For Goal Achievement: 04/03/16 Potential to Achieve Goals: Fair    Frequency Min 2X/week   Barriers to discharge        Co-evaluation               End of Session   Activity Tolerance: Patient limited by fatigue;Patient limited by pain Patient left: in bed;with call bell/phone within reach Nurse Communication: Mobility status;Need for lift equipment         Time: [id number] PT Time Calculation (min) (ACUTE ONLY): 23 min   Charges:   PT Evaluation $PT Eval Moderate Complexity: 1 Procedure PT Treatments $Therapeutic Activity: 8-22 mins   PT G CodesClearnce Sorrel Tracia Lacomb 03/20/2016, 9:55 AM Sherie Don, PT, DPT [phone]

## 2016-03-21 DIAGNOSIS — R748 Abnormal levels of other serum enzymes: Secondary | ICD-10-CM

## 2016-03-21 LAB — BASIC METABOLIC PANEL
ANION GAP: 6 (ref 5–15)
BUN: 7 mg/dL (ref 6–20)
CALCIUM: 8.5 mg/dL — AB (ref 8.9–10.3)
CO2: 24 mmol/L (ref 22–32)
Chloride: 106 mmol/L (ref 101–111)
Creatinine, Ser: 0.64 mg/dL (ref 0.61–1.24)
GFR calc Af Amer: >60 mL/min (ref >60)
GLUCOSE: 123 mg/dL — AB (ref 65–99)
POTASSIUM: 3.7 mmol/L (ref 3.5–5.1)
SODIUM: 136 mmol/L (ref 135–145)

## 2016-03-21 LAB — CBC WITH DIFFERENTIAL/PLATELET
BASOS ABS: 0 10*3/uL (ref 0.0–0.1)
BASOS PCT: 0 %
EOS ABS: 0.3 10*3/uL (ref 0.0–0.7)
Eosinophils Relative: 3 %
HCT: 35.1 % — ABNORMAL LOW (ref 39.0–52.0)
Hemoglobin: 11.9 g/dL — ABNORMAL LOW (ref 13.0–17.0)
Lymphocytes Relative: 19 %
Lymphs Abs: 2 10*3/uL (ref 0.7–4.0)
MCH: 29.5 pg (ref 26.0–34.0)
MCHC: 33.9 g/dL (ref 30.0–36.0)
MCV: 86.9 fL (ref 78.0–100.0)
MONOS PCT: 12 %
Monocytes Absolute: 1.2 10*3/uL — ABNORMAL HIGH (ref 0.1–1.0)
NEUTROS ABS: 6.9 10*3/uL (ref 1.7–7.7)
Neutrophils Relative %: 66 %
PLATELETS: 184 10*3/uL (ref 150–400)
RBC: 4.04 MIL/uL — ABNORMAL LOW (ref 4.22–5.81)
RDW: 13.6 % (ref 11.5–15.5)
WBC: 10.5 10*3/uL (ref 4.0–10.5)

## 2016-03-21 LAB — GLUCOSE, CAPILLARY
GLUCOSE-CAPILLARY: 158 mg/dL — AB (ref 65–99)
Glucose-Capillary: 128 mg/dL — ABNORMAL HIGH (ref 65–99)
Glucose-Capillary: 127 mg/dL — ABNORMAL HIGH (ref 65–99)
Glucose-Capillary: 145 mg/dL — ABNORMAL HIGH (ref 65–99)
Glucose-Capillary: 216 mg/dL — ABNORMAL HIGH (ref 65–99)
Glucose-Capillary: 116 mg/dL — ABNORMAL HIGH (ref 65–99)

## 2016-03-21 LAB — TROPONIN I
TROPONIN I: 0.11 ng/mL — AB (ref <0.03)
TROPONIN I: 0.05 ng/mL — AB (ref <0.03)

## 2016-03-21 LAB — MAGNESIUM: MAGNESIUM: 2 mg/dL (ref 1.7–2.4)

## 2016-03-21 MED ORDER — METOPROLOL TARTRATE 50 MG PO TABS
50.0000 mg | ORAL_TABLET | Freq: Two times a day (BID) | ORAL | Status: DC
  Administered 2016-03-21 – 2016-03-24 (×7): 50 mg via ORAL
  Filled 2016-03-21 (×7): qty 1

## 2016-03-21 NOTE — Clinical Social Work Placement (Signed)
   CLINICAL SOCIAL WORK PLACEMENT  NOTE  Date:  03/21/2016  Patient Details  Name: DVID BEDI MRN: [id number] Date of Birth: 04-26-1940  Clinical Social Work is seeking post-discharge placement for this patient at the [place] level of care (*CSW will initial, date and re-position this form in  chart as items are completed):  Yes   Patient/family provided with [place] Work Department's list of facilities offering this level of care within the geographic area requested by the patient (or if unable, by the patient's family).  Yes   Patient/family informed of their freedom to choose among providers that offer the needed level of care, that participate in Medicare, Medicaid or managed care program needed by the patient, have an available bed and are willing to accept the patient.  Yes   Patient/family informed of [facility]'s ownership interest in [facility] and [facility], as well as of the fact that they are under no obligation to receive care at these facilities.  PASRR submitted to EDS on 03/21/16     PASRR number received on 03/21/16     Existing PASRR number confirmed on       FL2 transmitted to all facilities in geographic area requested by pt/family on 03/21/16     FL2 transmitted to all facilities within larger geographic area on       Patient informed that his/her managed care company has contracts with or will negotiate with certain facilities, including the following:            Patient/family informed of bed offers received.  Patient chooses bed at       Physician recommends and patient chooses bed at      Patient to be transferred to   on  .  Patient to be transferred to facility by       Patient family notified on   of transfer.  Name of family member notified:        PHYSICIAN Please prepare priority discharge summary, including medications, Please prepare prescriptions, Please sign FL2     Additional  Comment:    _______________________________________________ Rigoberto Noel, LCSW 03/21/2016, 11:42 AM

## 2016-03-21 NOTE — Progress Notes (Addendum)
PROGRESS NOTE    Derek Bass  [id number] DOB: 05/15/1940 DOA: 03/17/2016 PCP: [name], MD   Brief Narrative:   75 y.o. BM PMHx prostate cancer S/P seed implants in [place] followed by Dr. Gaynelle Arabian, HTN, HLD, DM type II uncontrolled with complications, Hepatitis (while in the service), Hypothyroidism, Chronic morbid obesity, Chronic lymphedema  Presented from home with confusion after daughter called EMS saying the patient does not seem to be like himself anemia survival patient was unable to stand up his lower extremities noted to be more swollen than usual vital signs blood pressure 97/54 heart rate 140 blood sugar over 306. Family states he was wheezing initially he had severe chills. Family denies any sick contacts. He was incontinent of urine. Denies chest pain.    Examination did not take his medications recently. He hasn't been following up with his primary PCP   Regarding pertinent Chronic problems: She has poorly controlled diabetes with noncompliance to diet.  Regarding chronic lymphedema uses lymphedema pump and dry J compression stockings    Subjective: 12/24 A/O 4. Patient sitting comfortably in bed stating feels significantly improved. Currently negative CP negative SOB. ADDENDUM: Found patient halfway in/out of bed. Patient had been attempting to ambulate to bathroom, even though had been counseled by staff not to attempt without assistance. Patient sustained no injury.       Assessment & Plan:   Active Problems:   Diabetes type 2, uncontrolled (HCC)   HLD (hyperlipidemia)   HTN (hypertension)   Hypothyroidism   Morbid obesity with BMI of 50.0-59.9, adult (HCC)   Lymphedema   Sepsis ([place])   UTI (urinary tract infection)   Cardiomegaly   Acute cystitis with hematuria   Chronic venous stasis dermatitis   Noncompliance with medications   Sepsis secondary to UTI ([place])   Infection due to Enterobacter species   Uncontrolled type 2  diabetes mellitus with complication ([place])   Essential hypertension   Other hyperlipidemia   Other specified hypothyroidism   Sepsis/Positive ENTEROBACTER SPECIES UTI  -Normal saline 75 ml/hr -Continue current antibiotics  -Blood cultures NGTD. -Lactic acid normalized   Diabetes type 2, uncontrolled with complication -[id number] Hemoglobin A1c= 10.9 -Levemir 60 units daily -Moderate SSI  Cardiomegaly -Troponins have been trending them. However overnight slight trend up. However patient asymptomatic. Still believed secondary to demand ischemia but will trend troponins. Obtain EKG. If returning up and EKG changes will obtain cardiology consult. -Echocardiogram confirmed cardiomegaly see results below. -Strict I&O since admission + 2.5 L -Daily weight Filed Weights   03/19/16 1302 03/20/16 0438 03/21/16 0500  Weight: (!) 192.8 kg (425 lb) (!) 193.2 kg (426 lb) (!) 192.8 kg (425 lb)   HTN -see tachycardic  Sinus tachycardia -Metoprolol 5 mg QID PRN  -12/24 Increase Metoprolol 50 mg BID (home dose 100 mg BID)  Elevated troponin -Spoke with Dr. Domenic Polite Cardiology and he agrees that atypical presentation but still most likely secondary to demand ischemia. Also agrees that even if true ACS patient extremely poor candidate for any invasive procedure. Recommended patient establish care as outpatient upon discharge.  HLD  -Panel within ADA guideline -Lipitor 40 mg daily  Hypothyroidism  -TSH. WNL -Synthroid 200 g daily  Acute encephalopathy -Resolved  Hypokalemia -Potassium goal> 4  Hypomagnesemia -Magnesium goal> 2  Lymphedema/chronic venous stasis dermatitis -Wound Care consulted; Lymphedema pump only provided in outpatient setting  Constipation -1/2 bottle magnesium citrate. May repeat if no BM in 6 hours    DVT prophylaxis: Heparin Code  Status: Full Family Communication: None Disposition Plan: ? SNF   Consultants:  Phone consult Dr. Domenic Polite  Cardiology    Procedures/Significant Events:  12/21 Echocardiogram: Left ventricle: severe focal basal hypertrophy of the septum with mild posterior wall   hypertrophy. -LVEF = 60% to 65%. - (grade 1 diastolic dysfunction). 12/21 bilateral lower extremity Doppler: Bilateral lower extremity venous duplex completed. There is no obvious evidence of deep or superficial vein thrombosis involving the right and left lower extremities. All clearly visualized vessels appear patent and compressible. There is no evidence of Baker's cysts bilaterally    VENTILATOR SETTINGS:     Cultures 12/20 blood culture 2 12/21 urine positive ENTEROBACTER SPECIES 12/21 MRSA by PCR negative      Antimicrobials: Anti-infectives    Start   Dose/Rate Route Frequency Stop   03/18/16 2200 cefTRIAXone (ROCEPHIN) 2 g in dextrose 5 % 50 mL IVPB     2 g 100 mL/hr over 30 Minutes Intravenous Every 24 hours     03/17/16 2345 cefTRIAXone (ROCEPHIN) 2 g in dextrose 5 % 50 mL IVPB     2 g 100 mL/hr over 30 Minutes Intravenous  Once 03/18/16 0018       Devices    LINES / TUBES:      Continuous Infusions: . sodium chloride 75 mL/hr at 03/20/16 2232     Objective: Vitals:   03/21/16 0310 03/21/16 0500 03/21/16 0600 03/21/16 0800  BP: 105/77  107/66 117/65  Pulse: (!) 104  (!) 111 (!) 113  Resp:   10 17  Temp: 98.7 F (37.1 C)  98.8 F (37.1 C)   TempSrc: Oral  Oral   SpO2: 96%  96% 94%  Weight:  (!) 192.8 kg (425 lb)    Height:        Intake/Output Summary (Last 24 hours) at 03/21/16 [id number] Last data filed at 03/21/16 0800  Gross per 24 hour  Intake             2520 ml  Output             2675 ml  Net             -155 ml   Filed Weights   03/19/16 1302 03/20/16 0438 03/21/16 0500  Weight: (!) 192.8 kg (425 lb) (!) 193.2 kg (426 lb) (!) 192.8 kg (425 lb)    Examination:  General: A/O 4, NAD, No acute respiratory distress Eyes: negative scleral hemorrhage, negative anisocoria,  negative icterus ENT: Negative Runny nose, negative gingival bleeding, Neck:  Negative scars, masses, torticollis, lymphadenopathy, JVD Lungs: Clear to auscultation bilaterally without wheezes or crackles Cardiovascular: Regular rate and rhythm without murmur gallop or rub normal S1 and S2 Abdomen: MORBIDLY OBESE, negative, nondistended, positive soft, bowel sounds, no rebound, no ascites, no appreciable mass Extremities: positive significant cyanosis, clubbing, positive edema bilateral lower extremities Skin: Negative rashes, lesions, ulcers Psychiatric:  Negative depression, negative anxiety, negative fatigue, negative mania  Central nervous system:  Cranial nerves II through XII intact, tongue/uvula midline, all extremities muscle strength 5/5, sensation intact throughout, negative dysarthria, negative expressive aphasia, negative receptive aphasia.  .     Data Reviewed: Care during the described time interval was provided by me .  I have reviewed this patient's available data, including medical history, events of note, physical examination, and all test results as part of my evaluation. I have personally reviewed and interpreted all radiology studies.  CBC:  Recent Labs Lab 03/17/16 2104 03/18/16 0326 03/19/16  0423 03/20/16 0200 03/21/16 0251  WBC 17.1* 17.1* 17.7* 15.7* 10.5  NEUTROABS 15.5*  --  14.2* 12.5* 6.9  HGB 15.0 12.9* 12.2* 11.9* 11.9*  HCT 44.9 38.0* 36.3* 35.3* 35.1*  MCV 90.7 88.4 88.1 89.4 86.9  PLT 150 148* 137* 144* [id number]   Basic Metabolic Panel:  Recent Labs Lab 03/17/16 2104 03/18/16 0326 03/19/16 0423 03/20/16 0200 03/21/16 0251  NA 138 138 136 136 136  K 4.6 4.2 3.4* 3.7 3.7  CL 102 106 104 106 106  CO2 23 24 25 25 24   GLUCOSE 282* 270* 125* 131* 123*  BUN 10 13 14 11 7   CREATININE 1.65* 1.27* 0.90 0.80 0.64  CALCIUM 9.6 8.7* 8.6* 8.5* 8.5*  MG  --  1.4* 1.6* 2.0 2.0  PHOS  --  2.9  --   --   --    GFR: Estimated Creatinine Clearance: 144.8  mL/min (by C-G formula based on SCr of 0.64 mg/dL). Liver Function Tests:  Recent Labs Lab 03/17/16 2104 03/18/16 0326  AST 41 34  ALT 32 26  ALKPHOS 72 58  BILITOT 1.7* 1.1  PROT 7.2 6.2*  ALBUMIN 3.4* 2.8*   No results for input(s): LIPASE, AMYLASE in the last 168 hours. No results for input(s): AMMONIA in the last 168 hours. Coagulation Profile:  Recent Labs Lab 03/18/16 0326  INR 1.17   Cardiac Enzymes:  Recent Labs Lab 03/20/16 0200 03/20/16 1022 03/20/16 1445 03/20/16 2133 03/21/16 0251  TROPONINI 0.06* 0.51* 0.06* 0.16* <0.03   BNP (last 3 results) No results for input(s): PROBNP in the last 8760 hours. HbA1C: No results for input(s): HGBA1C in the last 72 hours. CBG:  Recent Labs Lab 03/20/16 1856 03/20/16 2126 03/20/16 2308 03/21/16 0303 03/21/16 0809  GLUCAP 141* 159* 173* 128* 127*   Lipid Profile:  Recent Labs  03/19/16 0423  CHOL 115  HDL 62  LDLCALC 42  TRIG 55  CHOLHDL 1.9   Thyroid Function Tests:  Recent Labs  03/20/16 0200  TSH 3.482   Anemia Panel: No results for input(s): VITAMINB12, FOLATE, FERRITIN, TIBC, IRON, RETICCTPCT in the last 72 hours. Urine analysis:    Component Value Date/Time   COLORURINE AMBER (A) 03/17/2016 2306   APPEARANCEUR HAZY (A) 03/17/2016 2306   LABSPEC 1.016 03/17/2016 2306   PHURINE 5.0 03/17/2016 2306   GLUCOSEU >=500 (A) 03/17/2016 2306   HGBUR LARGE (A) 03/17/2016 2306   HGBUR Neg 04/13/2010 1120   BILIRUBINUR NEGATIVE 03/17/2016 2306   BILIRUBINUR neg 09/19/2012 1125   KETONESUR 5 (A) 03/17/2016 2306   PROTEINUR 30 (A) 03/17/2016 2306   UROBILINOGEN 0.2 09/19/2012 1125   UROBILINOGEN 1.0 04/13/2010 1120   NITRITE NEGATIVE 03/17/2016 2306   LEUKOCYTESUR MODERATE (A) 03/17/2016 2306   Sepsis Labs: @LABRCNTIP (procalcitonin:4,lacticidven:4)  ) Recent Results (from the past 240 hour(s))  Blood culture (routine x 2)     Status: None (Preliminary result)   Collection Time: 03/17/16  10:32 PM  Result Value Ref Range Status   Specimen Description BLOOD RIGHT HAND  Final   Special Requests BOTTLES DRAWN AEROBIC AND ANAEROBIC 5ML  Final   Culture NO GROWTH 3 DAYS  Final   Report Status PENDING  Incomplete  Blood culture (routine x 2)     Status: None (Preliminary result)   Collection Time: 03/17/16 10:46 PM  Result Value Ref Range Status   Specimen Description BLOOD RIGHT WRIST  Final   Special Requests IN PEDIATRIC BOTTLE 1ML  Final   Culture  NO GROWTH 3 DAYS  Final   Report Status PENDING  Incomplete  Urine culture     Status: Abnormal   Collection Time: 03/18/16 12:45 AM  Result Value Ref Range Status   Specimen Description URINE, RANDOM  Final   Special Requests NONE  Final   Culture >=100,000 COLONIES/mL ENTEROBACTER SPECIES (A)  Final   Report Status 03/20/2016 FINAL  Final   Organism ID, Bacteria ENTEROBACTER SPECIES (A)  Final      Susceptibility   Enterobacter species - MIC*    CEFAZOLIN <=4 RESISTANT Resistant     CEFTRIAXONE <=1 SENSITIVE Sensitive     CIPROFLOXACIN <=0.25 SENSITIVE Sensitive     GENTAMICIN <=1 SENSITIVE Sensitive     IMIPENEM <=0.25 SENSITIVE Sensitive     NITROFURANTOIN 64 INTERMEDIATE Intermediate     TRIMETH/SULFA <=20 SENSITIVE Sensitive     PIP/TAZO <=4 SENSITIVE Sensitive     * >=100,000 COLONIES/mL ENTEROBACTER SPECIES  MRSA PCR Screening     Status: None   Collection Time: 03/18/16  4:50 AM  Result Value Ref Range Status   MRSA by PCR NEGATIVE NEGATIVE Final    Comment:        The GeneXpert MRSA Assay (FDA approved for NASAL specimens only), is one component of a comprehensive MRSA colonization surveillance program. It is not intended to diagnose MRSA infection nor to guide or monitor treatment for MRSA infections.   Respiratory Panel by PCR     Status: None   Collection Time: 03/18/16  6:00 AM  Result Value Ref Range Status   Adenovirus NOT DETECTED NOT DETECTED Final   Coronavirus 229E NOT DETECTED NOT  DETECTED Final   Coronavirus HKU1 NOT DETECTED NOT DETECTED Final   Coronavirus NL63 NOT DETECTED NOT DETECTED Final   Coronavirus OC43 NOT DETECTED NOT DETECTED Final   Metapneumovirus NOT DETECTED NOT DETECTED Final   Rhinovirus / Enterovirus NOT DETECTED NOT DETECTED Final   Influenza A NOT DETECTED NOT DETECTED Final   Influenza B NOT DETECTED NOT DETECTED Final   Parainfluenza Virus 1 NOT DETECTED NOT DETECTED Final   Parainfluenza Virus 2 NOT DETECTED NOT DETECTED Final   Parainfluenza Virus 3 NOT DETECTED NOT DETECTED Final   Parainfluenza Virus 4 NOT DETECTED NOT DETECTED Final   Respiratory Syncytial Virus NOT DETECTED NOT DETECTED Final   Bordetella pertussis NOT DETECTED NOT DETECTED Final   Chlamydophila pneumoniae NOT DETECTED NOT DETECTED Final   Mycoplasma pneumoniae NOT DETECTED NOT DETECTED Final         Radiology Studies: No results found.      Scheduled Meds: . aspirin  325 mg Oral Daily  . atorvastatin  40 mg Oral q1800  . cefTRIAXone (ROCEPHIN)  IV  2 g Intravenous Q24H  . feeding supplement (GLUCERNA SHAKE)  237 mL Oral TID BM  . guaiFENesin  600 mg Oral BID  . heparin  5,000 Units Subcutaneous Q8H  . insulin aspart  0-15 Units Subcutaneous Q4H  . insulin detemir  60 Units Subcutaneous Daily  . levothyroxine  200 mcg Oral QAC breakfast  . metoprolol tartrate  25 mg Oral BID  . senna  1 tablet Oral BID  . sodium chloride flush  3 mL Intravenous Q12H  . tamsulosin  0.4 mg Oral Daily   Continuous Infusions: . sodium chloride 75 mL/hr at 03/20/16 2232     LOS: 3 days    Time spent: 40 minutes    Charlestine Rookstool, Geraldo Docker, MD [facility] Pager [phone]  If 7PM-7AM, please contact night-coverage [email or web address] Password [facility] 03/21/2016, 8:35 AM

## 2016-03-21 NOTE — Clinical Social Work Note (Signed)
Clinical Social Work Assessment  Patient Details  Name: Derek Bass MRN: [id number] Date of Birth: December 23, 1940  Date of referral:  03/21/16               Reason for consult:  Facility Placement, Discharge Planning                Permission sought to share information with:  Facility Sport and exercise psychologist, Family Supports Permission granted to share information::  No (Patient states that he wants to handle his own affairs.)  Name::        Agency::     Relationship::     Contact Information:     Housing/Transportation Living arrangements for the past 2 months:  Single Family Home Source of Information:  Patient Patient Interpreter Needed:  None Criminal Activity/Legal Involvement Pertinent to Current Situation/Hospitalization:  No - Comment as needed Significant Relationships:  Adult Children Lives with:  Self Do you feel safe going back to the place where you live?  No Need for family participation in patient care:  No (Coment)  Care giving concerns:  The patient agrees with recommendation for SNF placement for short term rehab given his change in mobility status.   Social Worker assessment / plan:  CSW met with the patient at bedside to complete assessment. The patient became very teary eyed when discussing need for rehab. CSW can tell that the patient is overwhelmed by the change in his mobility status as he states that he has always been able to take care of himself. The patient feels he is losing his independence which is unsettling to him. The patient shares that he has a daughter but lives alone and prefers that CSW only make arrangements with him and he will update family as he sees fit. CSW explained SNF search/placment process and answered the patient's questions. CSW will follow up with bed offers once available. The patient will likely have a preference for [facility]. Patient aware that he will likely be ready for DC 12/26.  Employment status:  Retired Designer, industrial/product PT Recommendations:  [place] / Referral to community resources:  [place]  Patient/Family's Response to care:  The patient appears happy with the care he has received. The patient appreciates CSW's assistance with placement.   Patient/Family's Understanding of and Emotional Response to Diagnosis, Current Treatment, and Prognosis:  The patient appears to have a fair understanding of the reason for his admission and his post DC needs. The patient seems quite a bit overwhelmed at this time. Hopefully CSW's assistance with finalizing placement with help ease the patient's worries.   Emotional Assessment Appearance:  Appears stated age Attitude/Demeanor/Rapport:  Other (Very tearfull) Affect (typically observed):  Accepting, Tearful/Crying Orientation:  Oriented to Self, Oriented to Place, Oriented to  Time, Oriented to Situation Alcohol / Substance use:  Not Applicable Psych involvement (Current and /or in the community):  No (Comment)  Discharge Needs  Concerns to be addressed:  Care Coordination, Discharge Planning Concerns Readmission within the last 30 days:  No Current discharge risk:  Chronically ill, Physical Impairment, Lives alone Barriers to Discharge:  Continued Medical Work up   Fredderick Phenix B, LCSW 03/21/2016, 11:16 AM

## 2016-03-21 NOTE — NC FL2 (Signed)
[place] MEDICAID FL2 LEVEL OF CARE SCREENING TOOL     IDENTIFICATION  Patient Name: Derek Bass Birthdate: [birth year] Sex: male Admission Date (Current Location): 03/17/2016  [facility] and [place] Number:  Herbalist and Address:  [facility] [place], [place] [address] [place], [place] [address]      Provider Number: [id number]  Attending Physician Name and Address:  Allie Bossier, MD  Relative Name and Phone Number:       Current Level of Care: Hospital Recommended Level of Care: [place] Prior Approval Number:    Date Approved/Denied:   PASRR Number: [id number]  Discharge Plan: SNF    Current Diagnoses: Patient Active Problem List   Diagnosis Date Noted  . Acute cystitis with hematuria   . Chronic venous stasis dermatitis   . Noncompliance with medications   . Sepsis secondary to UTI ([place])   . Infection due to Enterobacter species   . Uncontrolled type 2 diabetes mellitus with complication ([place])   . Essential hypertension   . Other hyperlipidemia   . Other specified hypothyroidism   . Cardiomegaly 03/18/2016  . Sepsis ([place]) 03/17/2016  . UTI (urinary tract infection) 03/17/2016  . Acute sinusitis 04/14/2015  . Advanced care planning/counseling discussion 01/08/2014  . Lumbago 01/08/2014  . Lymphedema 05/10/2013  . Lipoma 09/19/2012  . Medicare annual wellness visit, subsequent 12/06/2011  . Morbid obesity with BMI of 50.0-59.9, adult ([place])   . Hypothyroidism 09/17/2010  . Diabetes type 2, uncontrolled ([place]) 04/13/2010  . HLD (hyperlipidemia) 04/13/2010  . 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 HTN (hypertension) 04/13/2010  . Dental caries 04/13/2010  . LEG EDEMA, BILATERAL 04/13/2010  . PERS HX NONCOMPLIANCE W/MED TX PRS HAZARDS HLTH 04/13/2010    Orientation RESPIRATION BLADDER Height & Weight     Self, Time, Situation, Place  Normal Continent Weight: (!) 192.8 kg (425 lb) Height:  6\' 2"  (188 cm)  BEHAVIORAL  SYMPTOMS/MOOD NEUROLOGICAL BOWEL NUTRITION STATUS   (NONE)  (NONE) Incontinent Diet (Heart Healthy)  AMBULATORY STATUS COMMUNICATION OF NEEDS Skin   Extensive Assist Verbally Normal                       Personal Care Assistance Level of Assistance  Bathing, Feeding, Dressing Bathing Assistance: Maximum assistance Feeding assistance: Independent Dressing Assistance: Maximum assistance     Functional Limitations Info  Sight, Hearing Sight Info: Adequate Hearing Info: Adequate Speech Info: Adequate    SPECIAL CARE FACTORS FREQUENCY  PT (By licensed PT), OT (By licensed OT)     PT Frequency: 5/week OT Frequency: 5/week            Contractures Contractures Info: Not present    Additional Factors Info  Code Status, Allergies, Insulin Sliding Scale Code Status Info: Full Code Allergies Info: NKDA   Insulin Sliding Scale Info: 6/day       Current Medications (03/21/2016):  This is the current hospital active medication list Current Facility-Administered Medications  Medication Dose Route Frequency Provider Last Rate Last Dose  . 0.9 %  sodium chloride infusion   Intravenous Continuous Allie Bossier, MD 75 mL/hr at 03/20/16 2232    . acetaminophen (TYLENOL) tablet 650 mg  650 mg Oral Q6H PRN Toy Baker, MD   650 mg at 03/19/16 0039   Or  . acetaminophen (TYLENOL) suppository 650 mg  650 mg Rectal Q6H PRN Toy Baker, MD      . aspirin tablet 325 mg  325 mg Oral Daily Toy Baker, MD   325 mg at 03/21/16 1059  . atorvastatin (LIPITOR) tablet 40 mg  40 mg Oral q1800 Toy Baker, MD   40 mg at 03/20/16 1849  . bisacodyl (DULCOLAX) suppository 10 mg  10 mg Rectal Daily PRN Toy Baker, MD      . cefTRIAXone (ROCEPHIN) 2 g in dextrose 5 % 50 mL IVPB  2 g Intravenous Q24H Gardiner Barefoot, NP   2 g at 03/20/16 2146  . feeding supplement (GLUCERNA SHAKE) (GLUCERNA SHAKE) liquid 237 mL  237 mL Oral TID BM Allie Bossier, MD   237 mL at  03/21/16 1101  . guaiFENesin (MUCINEX) 12 hr tablet 600 mg  600 mg Oral BID Toy Baker, MD   600 mg at 03/21/16 1059  . heparin injection 5,000 Units  5,000 Units Subcutaneous Q8H Toy Baker, MD   5,000 Units at 03/21/16 0553  . HYDROcodone-acetaminophen (NORCO/VICODIN) 5-325 MG per tablet 1-2 tablet  1-2 tablet Oral Q4H PRN Toy Baker, MD      . insulin aspart (novoLOG) injection 0-15 Units  0-15 Units Subcutaneous Q4H Allie Bossier, MD   2 Units at 03/21/16 0827  . insulin detemir (LEVEMIR) injection 60 Units  60 Units Subcutaneous Daily Toy Baker, MD   60 Units at 03/21/16 1059  . ipratropium (ATROVENT) nebulizer solution 0.5 mg  0.5 mg Nebulization Q6H PRN Allie Bossier, MD      . levalbuterol [facility]) nebulizer solution 0.63 mg  0.63 mg Nebulization Q6H PRN Toy Baker, MD      . levothyroxine (SYNTHROID, LEVOTHROID) tablet 200 mcg  200 mcg Oral QAC breakfast Toy Baker, MD   200 mcg at 03/21/16 0747  . metoprolol (LOPRESSOR) injection 2.5-5 mg  2.5-5 mg Intravenous Q3H PRN Colbert Coyer, MD   5 mg at 03/19/16 2234  . metoprolol (LOPRESSOR) tablet 50 mg  50 mg Oral BID Allie Bossier, MD   50 mg at 03/21/16 1059  . ondansetron (ZOFRAN) tablet 4 mg  4 mg Oral Q6H PRN Toy Baker, MD       Or  . ondansetron (ZOFRAN) injection 4 mg  4 mg Intravenous Q6H PRN Toy Baker, MD      . senna (SENOKOT) tablet 8.6 mg  1 tablet Oral BID Toy Baker, MD   8.6 mg at 03/21/16 1059  . sodium chloride flush (NS) 0.9 % injection 3 mL  3 mL Intravenous Q12H Toy Baker, MD   3 mL at 03/20/16 1034  . tamsulosin (FLOMAX) capsule 0.4 mg  0.4 mg Oral Daily Toy Baker, MD   0.4 mg at 03/21/16 1059     Discharge Medications: Please see discharge summary for a list of discharge medications.  Relevant Imaging Results:  Relevant Lab Results:   Additional Information SSN: [id number]  Rigoberto Noel,  LCSW

## 2016-03-22 DIAGNOSIS — G9341 Metabolic encephalopathy: Secondary | ICD-10-CM

## 2016-03-22 DIAGNOSIS — I872 Venous insufficiency (chronic) (peripheral): Secondary | ICD-10-CM

## 2016-03-22 LAB — CBC WITH DIFFERENTIAL/PLATELET
BASOS PCT: 0 %
Basophils Absolute: 0 10*3/uL (ref 0.0–0.1)
Eosinophils Absolute: 0.3 10*3/uL (ref 0.0–0.7)
Eosinophils Relative: 4 %
HEMATOCRIT: 36.2 % — AB (ref 39.0–52.0)
Hemoglobin: 12.1 g/dL — ABNORMAL LOW (ref 13.0–17.0)
LYMPHS PCT: 26 %
Lymphs Abs: 2.1 10*3/uL (ref 0.7–4.0)
MCH: 29.7 pg (ref 26.0–34.0)
MCHC: 33.4 g/dL (ref 30.0–36.0)
MCV: 88.9 fL (ref 78.0–100.0)
MONO ABS: 1.3 10*3/uL — AB (ref 0.1–1.0)
Monocytes Relative: 16 %
NEUTROS ABS: 4.4 10*3/uL (ref 1.7–7.7)
Neutrophils Relative %: 54 %
Platelets: 210 10*3/uL (ref 150–400)
RBC: 4.07 MIL/uL — ABNORMAL LOW (ref 4.22–5.81)
RDW: 13.9 % (ref 11.5–15.5)
WBC: 8.2 10*3/uL (ref 4.0–10.5)

## 2016-03-22 LAB — CULTURE, BLOOD (ROUTINE X 2)
CULTURE: NO GROWTH
CULTURE: NO GROWTH

## 2016-03-22 LAB — GLUCOSE, CAPILLARY
GLUCOSE-CAPILLARY: 137 mg/dL — AB (ref 65–99)
GLUCOSE-CAPILLARY: 150 mg/dL — AB (ref 65–99)
Glucose-Capillary: 108 mg/dL — ABNORMAL HIGH (ref 65–99)
Glucose-Capillary: 118 mg/dL — ABNORMAL HIGH (ref 65–99)
Glucose-Capillary: 139 mg/dL — ABNORMAL HIGH (ref 65–99)
Glucose-Capillary: 123 mg/dL — ABNORMAL HIGH (ref 65–99)

## 2016-03-22 LAB — TROPONIN I
TROPONIN I: 0.05 ng/mL — AB (ref <0.03)
Troponin I: 0.05 ng/mL (ref <0.03)
Troponin I: 0.03 ng/mL (ref <0.03)
Troponin I: <0.03 ng/mL (ref <0.03)

## 2016-03-22 LAB — BASIC METABOLIC PANEL
ANION GAP: 7 (ref 5–15)
BUN: 5 mg/dL — ABNORMAL LOW (ref 6–20)
CO2: 24 mmol/L (ref 22–32)
Calcium: 8.8 mg/dL — ABNORMAL LOW (ref 8.9–10.3)
Chloride: 107 mmol/L (ref 101–111)
Creatinine, Ser: 0.68 mg/dL (ref 0.61–1.24)
Glucose, Bld: 110 mg/dL — ABNORMAL HIGH (ref 65–99)
POTASSIUM: 3.6 mmol/L (ref 3.5–5.1)
SODIUM: 138 mmol/L (ref 135–145)

## 2016-03-22 LAB — MAGNESIUM: Magnesium: 2.1 mg/dL (ref 1.7–2.4)

## 2016-03-22 NOTE — Progress Notes (Addendum)
PROGRESS NOTE    Derek Bass  [id number] DOB: Apr 15, 1940 DOA: 03/17/2016 PCP: [name], MD   Brief Narrative:   75 y.o. BM PMHx prostate cancer S/P seed implants in [place] followed by Dr. Gaynelle Arabian, HTN, HLD, DM type II uncontrolled with complications, Hepatitis (while in the service), Hypothyroidism, Chronic morbid obesity, Chronic lymphedema  Presented from home with confusion after daughter called EMS saying the patient does not seem to be like himself anemia survival patient was unable to stand up his lower extremities noted to be more swollen than usual vital signs blood pressure 97/54 heart rate 140 blood sugar over 306. Family states he was wheezing initially he had severe chills. Family denies any sick contacts. He was incontinent of urine. Denies chest pain.    Examination did not take his medications recently. He hasn't been following up with his primary PCP   Regarding pertinent Chronic problems: She has poorly controlled diabetes with noncompliance to diet.  Regarding chronic lymphedema uses lymphedema pump and dry J compression stockings    Subjective: 12/25  A/O 4. Patient sitting comfortably in bed stating feels significantly improved. Currently negative CP negative SOB.       Assessment & Plan:   Active Problems:   Diabetes type 2, uncontrolled (HCC)   HLD (hyperlipidemia)   HTN (hypertension)   Hypothyroidism   Morbid obesity with BMI of 50.0-59.9, adult (HCC)   Lymphedema   Sepsis ([place])   UTI (urinary tract infection)   Cardiomegaly   Acute cystitis with hematuria   Chronic venous stasis dermatitis   Noncompliance with medications   Sepsis secondary to UTI ([place])   Infection due to Enterobacter species   Uncontrolled type 2 diabetes mellitus with complication ([place])   Essential hypertension   Other hyperlipidemia   Other specified hypothyroidism   Chronic venous stasis dermatitis of both lower extremities   Elevated  troponin   Sepsis/Positive ENTEROBACTER SPECIES UTI  -Complete 5 day course antibiotics  -Blood cultures negative   Diabetes type 2, uncontrolled with complication -[id number] Hemoglobin A1c= 10.9 -Levemir 60 units daily -Moderate SSI  Cardiomegaly -Troponins have been trending them. However overnight slight trend up. However patient asymptomatic. Still believed secondary to demand ischemia but will trend troponins. Obtain EKG. If returning up and EKG changes will obtain cardiology consult. -Echocardiogram confirmed cardiomegaly see results below. -Strict I&O since admission + 3.2 L -Daily weight Filed Weights   03/19/16 1302 03/20/16 0438 03/21/16 0500  Weight: (!) 192.8 kg (425 lb) (!) 193.2 kg (426 lb) (!) 192.8 kg (425 lb)   HTN -see tachycardic  Sinus tachycardia -Metoprolol 5 mg QID PRN  -12/24 Increase Metoprolol 50 mg BID (home dose 100 mg BID)  Elevated troponin -Spoke with Dr. Domenic Polite Cardiology and he agrees that atypical presentation but still most likely secondary to demand ischemia. Also agrees that even if true ACS patient extremely poor candidate for any invasive procedure. Recommended patient establish care as outpatient upon discharge.  HLD  -Panel within ADA guideline -Lipitor 40 mg daily  Hypothyroidism  -TSH. WNL -Synthroid 200 g daily  Acute encephalopathy -Resolved  Hypokalemia -Potassium goal> 4  Hypomagnesemia -Magnesium goal> 2  Lymphedema/chronic venous stasis dermatitis -Wound Care consulted; Lymphedema pump only provided in outpatient setting  Constipation -1/2 bottle magnesium citrate. May repeat if no BM in 6 hours    DVT prophylaxis: Heparin Code Status: Full Family Communication: Family present discussion of plan care Disposition Plan: SNF   Consultants:  Phone consult Dr. Domenic Polite  Cardiology    Procedures/Significant Events:  12/21 Echocardiogram: Left ventricle: severe focal basal hypertrophy of the septum with mild  posterior wall   hypertrophy. -LVEF = 60% to 65%. - (grade 1 diastolic dysfunction). 12/21 bilateral lower extremity Doppler: Bilateral lower extremity venous duplex completed. There is no obvious evidence of deep or superficial vein thrombosis involving the right and left lower extremities. All clearly visualized vessels appear patent and compressible. There is no evidence of Baker's cysts bilaterally    VENTILATOR SETTINGS:     Cultures 12/20 blood culture 2 negative 12/21 urine positive ENTEROBACTER SPECIES 12/21 MRSA by PCR negative      Antimicrobials: Anti-infectives    Start   Frequency Stop   03/18/16 2200 cefTRIAXone (ROCEPHIN) 2 g in dextrose 5 % 50 mL IVPB     Every 24 hours     03/17/16 2345 cefTRIAXone (ROCEPHIN) 2 g in dextrose 5 % 50 mL IVPB      Once 03/18/16 0018       Devices    LINES / TUBES:      Continuous Infusions: . sodium chloride 75 mL/hr at 03/21/16 2206     Objective: Vitals:   03/21/16 1700 03/21/16 1932 03/21/16 2329 03/22/16 0343  BP: 136/65 123/77 115/60 121/68  Pulse: 90 (!) 101 93 100  Resp: 14 (!) 23 18 12   Temp:  98.4 F (36.9 C) 98.2 F (36.8 C) 98.8 F (37.1 C)  TempSrc:  Oral Oral Oral  SpO2: 92% 97% 96% 97%  Weight:      Height:        Intake/Output Summary (Last 24 hours) at 03/22/16 [id number] Last data filed at 03/22/16 0600  Gross per 24 hour  Intake             2930 ml  Output             1200 ml  Net             1730 ml   Filed Weights   03/19/16 1302 03/20/16 0438 03/21/16 0500  Weight: (!) 192.8 kg (425 lb) (!) 193.2 kg (426 lb) (!) 192.8 kg (425 lb)    Examination:  General: A/O 4, NAD, No acute respiratory distress Eyes: negative scleral hemorrhage, negative anisocoria, negative icterus ENT: Negative Runny nose, negative gingival bleeding, Neck:  Negative scars, masses, torticollis, lymphadenopathy, JVD Lungs: Clear to auscultation bilaterally without wheezes or crackles Cardiovascular:  Regular rate and rhythm without murmur gallop or rub normal S1 and S2 Abdomen: MORBIDLY OBESE, negative, nondistended, positive soft, bowel sounds, no rebound, no ascites, no appreciable mass Extremities: positive significant cyanosis, clubbing, positive edema bilateral lower extremities Skin: Negative rashes, lesions, ulcers Psychiatric:  Negative depression, negative anxiety, negative fatigue, negative mania  Central nervous system:  Cranial nerves II through XII intact, tongue/uvula midline, all extremities muscle strength 5/5, sensation intact throughout, negative dysarthria, negative expressive aphasia, negative receptive aphasia.  .     Data Reviewed: Care during the described time interval was provided by me .  I have reviewed this patient's available data, including medical history, events of note, physical examination, and all test results as part of my evaluation. I have personally reviewed and interpreted all radiology studies.  CBC:  Recent Labs Lab 03/17/16 2104 03/18/16 0326 03/19/16 0423 03/20/16 0200 03/21/16 0251 03/22/16 0250  WBC 17.1* 17.1* 17.7* 15.7* 10.5 8.2  NEUTROABS 15.5*  --  14.2* 12.5* 6.9 4.4  HGB 15.0 12.9* 12.2* 11.9* 11.9* 12.1*  HCT 44.9  38.0* 36.3* 35.3* 35.1* 36.2*  MCV 90.7 88.4 88.1 89.4 86.9 88.9  PLT 150 148* 137* 144* 184 [id number]   Basic Metabolic Panel:  Recent Labs Lab 03/18/16 0326 03/19/16 0423 03/20/16 0200 03/21/16 0251 03/22/16 0250  NA 138 136 136 136 138  K 4.2 3.4* 3.7 3.7 3.6  CL 106 104 106 106 107  CO2 24 25 25 24 24   GLUCOSE 270* 125* 131* 123* 110*  BUN 13 14 11 7  5*  CREATININE 1.27* 0.90 0.80 0.64 0.68  CALCIUM 8.7* 8.6* 8.5* 8.5* 8.8*  MG 1.4* 1.6* 2.0 2.0 2.1  PHOS 2.9  --   --   --   --    GFR: Estimated Creatinine Clearance: 144.8 mL/min (by C-G formula based on SCr of 0.68 mg/dL). Liver Function Tests:  Recent Labs Lab 03/17/16 2104 03/18/16 0326  AST 41 34  ALT 32 26  ALKPHOS 72 58  BILITOT 1.7*  1.1  PROT 7.2 6.2*  ALBUMIN 3.4* 2.8*   No results for input(s): LIPASE, AMYLASE in the last 168 hours. No results for input(s): AMMONIA in the last 168 hours. Coagulation Profile:  Recent Labs Lab 03/18/16 0326  INR 1.17   Cardiac Enzymes:  Recent Labs Lab 03/21/16 0251 03/21/16 0908 03/21/16 1443 03/21/16 2127 03/22/16 0250  TROPONINI <0.03 0.11* 0.05* <0.03 0.05*   BNP (last 3 results) No results for input(s): PROBNP in the last 8760 hours. HbA1C: No results for input(s): HGBA1C in the last 72 hours. CBG:  Recent Labs Lab 03/21/16 1228 03/21/16 1710 03/21/16 1929 03/21/16 2328 03/22/16 0341  GLUCAP 145* 158* 216* 116* 108*   Lipid Profile: No results for input(s): CHOL, HDL, LDLCALC, TRIG, CHOLHDL, LDLDIRECT in the last 72 hours. Thyroid Function Tests:  Recent Labs  03/20/16 0200  TSH 3.482   Anemia Panel: No results for input(s): VITAMINB12, FOLATE, FERRITIN, TIBC, IRON, RETICCTPCT in the last 72 hours. Urine analysis:    Component Value Date/Time   COLORURINE AMBER (A) 03/17/2016 2306   APPEARANCEUR HAZY (A) 03/17/2016 2306   LABSPEC 1.016 03/17/2016 2306   PHURINE 5.0 03/17/2016 2306   GLUCOSEU >=500 (A) 03/17/2016 2306   HGBUR LARGE (A) 03/17/2016 2306   HGBUR Neg 04/13/2010 1120   BILIRUBINUR NEGATIVE 03/17/2016 2306   BILIRUBINUR neg 09/19/2012 1125   KETONESUR 5 (A) 03/17/2016 2306   PROTEINUR 30 (A) 03/17/2016 2306   UROBILINOGEN 0.2 09/19/2012 1125   UROBILINOGEN 1.0 04/13/2010 1120   NITRITE NEGATIVE 03/17/2016 2306   LEUKOCYTESUR MODERATE (A) 03/17/2016 2306   Sepsis Labs: @LABRCNTIP (procalcitonin:4,lacticidven:4)  ) Recent Results (from the past 240 hour(s))  Blood culture (routine x 2)     Status: None (Preliminary result)   Collection Time: 03/17/16 10:32 PM  Result Value Ref Range Status   Specimen Description BLOOD RIGHT HAND  Final   Special Requests BOTTLES DRAWN AEROBIC AND ANAEROBIC 5ML  Final   Culture NO GROWTH 4  DAYS  Final   Report Status PENDING  Incomplete  Blood culture (routine x 2)     Status: None (Preliminary result)   Collection Time: 03/17/16 10:46 PM  Result Value Ref Range Status   Specimen Description BLOOD RIGHT WRIST  Final   Special Requests IN PEDIATRIC BOTTLE 1ML  Final   Culture NO GROWTH 4 DAYS  Final   Report Status PENDING  Incomplete  Urine culture     Status: Abnormal   Collection Time: 03/18/16 12:45 AM  Result Value Ref Range Status   Specimen  Description URINE, RANDOM  Final   Special Requests NONE  Final   Culture >=100,000 COLONIES/mL ENTEROBACTER SPECIES (A)  Final   Report Status 03/20/2016 FINAL  Final   Organism ID, Bacteria ENTEROBACTER SPECIES (A)  Final      Susceptibility   Enterobacter species - MIC*    CEFAZOLIN <=4 RESISTANT Resistant     CEFTRIAXONE <=1 SENSITIVE Sensitive     CIPROFLOXACIN <=0.25 SENSITIVE Sensitive     GENTAMICIN <=1 SENSITIVE Sensitive     IMIPENEM <=0.25 SENSITIVE Sensitive     NITROFURANTOIN 64 INTERMEDIATE Intermediate     TRIMETH/SULFA <=20 SENSITIVE Sensitive     PIP/TAZO <=4 SENSITIVE Sensitive     * >=100,000 COLONIES/mL ENTEROBACTER SPECIES  MRSA PCR Screening     Status: None   Collection Time: 03/18/16  4:50 AM  Result Value Ref Range Status   MRSA by PCR NEGATIVE NEGATIVE Final    Comment:        The GeneXpert MRSA Assay (FDA approved for NASAL specimens only), is one component of a comprehensive MRSA colonization surveillance program. It is not intended to diagnose MRSA infection nor to guide or monitor treatment for MRSA infections.   Respiratory Panel by PCR     Status: None   Collection Time: 03/18/16  6:00 AM  Result Value Ref Range Status   Adenovirus NOT DETECTED NOT DETECTED Final   Coronavirus 229E NOT DETECTED NOT DETECTED Final   Coronavirus HKU1 NOT DETECTED NOT DETECTED Final   Coronavirus NL63 NOT DETECTED NOT DETECTED Final   Coronavirus OC43 NOT DETECTED NOT DETECTED Final    Metapneumovirus NOT DETECTED NOT DETECTED Final   Rhinovirus / Enterovirus NOT DETECTED NOT DETECTED Final   Influenza A NOT DETECTED NOT DETECTED Final   Influenza B NOT DETECTED NOT DETECTED Final   Parainfluenza Virus 1 NOT DETECTED NOT DETECTED Final   Parainfluenza Virus 2 NOT DETECTED NOT DETECTED Final   Parainfluenza Virus 3 NOT DETECTED NOT DETECTED Final   Parainfluenza Virus 4 NOT DETECTED NOT DETECTED Final   Respiratory Syncytial Virus NOT DETECTED NOT DETECTED Final   Bordetella pertussis NOT DETECTED NOT DETECTED Final   Chlamydophila pneumoniae NOT DETECTED NOT DETECTED Final   Mycoplasma pneumoniae NOT DETECTED NOT DETECTED Final         Radiology Studies: No results found.      Scheduled Meds: . aspirin  325 mg Oral Daily  . atorvastatin  40 mg Oral q1800  . cefTRIAXone (ROCEPHIN)  IV  2 g Intravenous Q24H  . feeding supplement (GLUCERNA SHAKE)  237 mL Oral TID BM  . guaiFENesin  600 mg Oral BID  . heparin  5,000 Units Subcutaneous Q8H  . insulin aspart  0-15 Units Subcutaneous Q4H  . insulin detemir  60 Units Subcutaneous Daily  . levothyroxine  200 mcg Oral QAC breakfast  . metoprolol tartrate  50 mg Oral BID  . senna  1 tablet Oral BID  . sodium chloride flush  3 mL Intravenous Q12H  . tamsulosin  0.4 mg Oral Daily   Continuous Infusions: . sodium chloride 75 mL/hr at 03/21/16 2206     LOS: 4 days    Time spent: 40 minutes    Kathline Banbury, Geraldo Docker, MD [facility] Pager [phone]   If 7PM-7AM, please contact night-coverage [email or web address] Password [facility] 03/22/2016, 7:18 AM

## 2016-03-23 DIAGNOSIS — G9341 Metabolic encephalopathy: Secondary | ICD-10-CM

## 2016-03-23 LAB — CBC WITH DIFFERENTIAL/PLATELET
BASOS PCT: 0 %
Basophils Absolute: 0 10*3/uL (ref 0.0–0.1)
EOS ABS: 0.3 10*3/uL (ref 0.0–0.7)
EOS PCT: 4 %
HEMATOCRIT: 35.1 % — AB (ref 39.0–52.0)
Hemoglobin: 12 g/dL — ABNORMAL LOW (ref 13.0–17.0)
LYMPHS ABS: 2.9 10*3/uL (ref 0.7–4.0)
Lymphocytes Relative: 34 %
MCH: 29.9 pg (ref 26.0–34.0)
MCHC: 34.2 g/dL (ref 30.0–36.0)
MCV: 87.3 fL (ref 78.0–100.0)
MONO ABS: 1 10*3/uL (ref 0.1–1.0)
Monocytes Relative: 12 %
Neutro Abs: 4.4 10*3/uL (ref 1.7–7.7)
Neutrophils Relative %: 50 %
PLATELETS: 243 10*3/uL (ref 150–400)
RBC: 4.02 MIL/uL — ABNORMAL LOW (ref 4.22–5.81)
RDW: 13.5 % (ref 11.5–15.5)
WBC: 8.6 10*3/uL (ref 4.0–10.5)

## 2016-03-23 LAB — GLUCOSE, CAPILLARY
GLUCOSE-CAPILLARY: 146 mg/dL — AB (ref 65–99)
GLUCOSE-CAPILLARY: 157 mg/dL — AB (ref 65–99)
Glucose-Capillary: 106 mg/dL — ABNORMAL HIGH (ref 65–99)
Glucose-Capillary: 122 mg/dL — ABNORMAL HIGH (ref 65–99)
Glucose-Capillary: 150 mg/dL — ABNORMAL HIGH (ref 65–99)

## 2016-03-23 LAB — BASIC METABOLIC PANEL
ANION GAP: 6 (ref 5–15)
BUN: 5 mg/dL — ABNORMAL LOW (ref 6–20)
CALCIUM: 8.9 mg/dL (ref 8.9–10.3)
CO2: 25 mmol/L (ref 22–32)
CREATININE: 0.69 mg/dL (ref 0.61–1.24)
Chloride: 106 mmol/L (ref 101–111)
GLUCOSE: 121 mg/dL — AB (ref 65–99)
Potassium: 3.5 mmol/L (ref 3.5–5.1)
Sodium: 137 mmol/L (ref 135–145)

## 2016-03-23 LAB — TROPONIN I
Troponin I: <0.03 ng/mL (ref <0.03)
Troponin I: <0.03 ng/mL (ref <0.03)
Troponin I: <0.03 ng/mL (ref <0.03)

## 2016-03-23 LAB — MAGNESIUM: Magnesium: 2 mg/dL (ref 1.7–2.4)

## 2016-03-23 NOTE — Progress Notes (Signed)
Physical Therapy Treatment Patient Details Name: Derek Bass MRN: [id number] DOB: 1941/03/07 Today's Date: 03/23/2016    History of Present Illness Pt is a 75 y/o male admitted secondary to confusion and inability to get out of bed secondary to increased edema in bilateral LEs. PMH including but not limited to chronic lymphedema, chronic morbid obesity, HTN, and poorly controlled DM.    PT Comments    Patient seen in conjunction with OT therapist for OOB mobility. Patient tolerated EOB activity well but required increased physical assist to stand and pivot OOB to chair. Spoke with patient at length regarding safety with mobility and elevation of LEs for edema control. Patient appears receptive. Will continue current POC.  Follow Up Recommendations  SNF;Supervision/Assistance - 24 hour     Equipment Recommendations  None recommended by PT    Recommendations for Other Services       Precautions / Restrictions Precautions Precautions: Fall Restrictions Weight Bearing Restrictions: No    Mobility  Bed Mobility Overal bed mobility: Needs Assistance Bed Mobility: Supine to Sit     Supine to sit: Min assist;HOB elevated (A to move RLE off bed)     General bed mobility comments: patient able to utilize UE and rail to come to upright in semi long sitting, assist for LE movement to EOB and multi modal cues for positioning  Transfers Overall transfer level: Needs assistance Equipment used: Rolling walker (2 wheeled) Transfers: Sit to/from [facility] Sit to Stand: Mod assist;+2 physical assistance;From elevated surface Stand pivot transfers: Mod assist;+2 physical assistance;From elevated surface       General transfer comment: increased physical assist due to BLE buckling in standing. increased time and effort to perform  Ambulation/Gait                 Stairs            Wheelchair Mobility    Modified Rankin (Stroke Patients Only)       Balance Overall balance assessment: Needs assistance;History of Falls   Sitting balance-Leahy Scale: Good       Standing balance-Leahy Scale: Poor                      Cognition Arousal/Alertness: Awake/alert Behavior During Therapy: WFL for tasks assessed/performed Overall Cognitive Status: No family/caregiver present to determine baseline cognitive functioning                      Exercises      General Comments General comments (skin integrity, edema, etc.): performed sit <> stand x3 during session with increased assist      Pertinent Vitals/Pain Pain Assessment: Faces Faces Pain Scale: Hurts a little bit Pain Location: general discomfort Pain Descriptors / Indicators: Discomfort Pain Intervention(s): Limited activity within patient's tolerance    Home Living Family/patient expects to be discharged to:: Skilled nursing facility                    Prior Function Level of Independence: Independent with assistive device(s)          PT Goals (current goals can now be found in the care plan section) Acute Rehab PT Goals Patient Stated Goal: to be independent again PT Goal Formulation: With patient Time For Goal Achievement: 04/03/16 Potential to Achieve Goals: Fair Progress towards PT goals: Progressing toward goals    Frequency    Min 2X/week      PT Plan Current plan remains  appropriate    Co-evaluation PT/OT/SLP Co-Evaluation/Treatment: Yes Reason for Co-Treatment: Complexity of the patient's impairments (multi-system involvement);For patient/therapist safety;To address functional/ADL transfers PT goals addressed during session: Mobility/safety with mobility OT goals addressed during session: ADL's and self-care     End of Session Equipment Utilized During Treatment: Gait belt Activity Tolerance: Patient limited by fatigue Patient left: in chair;with call bell/phone within reach;with chair alarm set     Time:  1412-1446 PT Time Calculation (min) (ACUTE ONLY): 34 min  Charges:  $Therapeutic Activity: 8-22 mins                    G Codes:      Duncan Dull 04/05/2016, 3:41 PM Alben Deeds, [place] DPT  [phone]

## 2016-03-23 NOTE — Care Management Important Message (Signed)
Important Message  Patient Details  Name: Derek Bass MRN: [id number] Date of Birth: 05-18-1940   Medicare Important Message Given:  Yes    Orbie Pyo 03/23/2016, 2:08 PM

## 2016-03-23 NOTE — Progress Notes (Signed)
Occupational Therapy Evaluation Patient Details Name: Derek Bass MRN: [id number] DOB: 11-Nov-1940 Today's Date: 03/23/2016    History of Present Illness Pt is a 76 y/o male admitted secondary to confusion and inability to get out of bed secondary to increased edema in bilateral LEs. PMH including but not limited to chronic lymphedema, chronic morbid obesity, HTN, and poorly controlled DM.   Clinical Impression   PTA, pt lived alone and was modified independent with mobility and ADL. Pt presents with significant funcitonal change and will benefit from  Rehab at SNF to facilitate safe DC home. Will follow acutely to address established goals. Recommend nsg use maximove to get pt OOB to recliner daily. Pt very appreciative.     Follow Up Recommendations  SNF;Supervision/Assistance - 24 hour    Equipment Recommendations  Other (comment) (TBA at SNF)    Recommendations for Other Services       Precautions / Restrictions Precautions Precautions: Fall Restrictions Weight Bearing Restrictions: No      Mobility Bed Mobility Overal bed mobility: Needs Assistance Bed Mobility: Supine to Sit     Supine to sit: Min assist;HOB elevated (A to move RLE off bed)        Transfers Overall transfer level: Needs assistance Equipment used: Rolling walker (2 wheeled) Transfers: Sit to/from [facility] Sit to Stand: Mod assist;+2 physical assistance;From elevated surface              Balance Overall balance assessment: Needs assistance;History of Falls   Sitting balance-Leahy Scale: Good       Standing balance-Leahy Scale: Poor                              ADL Overall ADL's : Needs assistance/impaired     Grooming: Set up   Upper Body Bathing: Set up;Sitting   Lower Body Bathing: Moderate assistance;Sit to/from stand   Upper Body Dressing : Minimal assistance;Sitting   Lower Body Dressing: Moderate assistance;Sit to/from stand    Toilet Transfer: Moderate assistance;+2 for physical assistance;Stand-pivot   Toileting- Clothing Manipulation and Hygiene: Total assistance Toileting - Clothing Manipulation Details (indicate cue type and reason): Assist for pericare     Functional mobility during ADLs: Moderate assistance;+2 for physical assistance;Rolling walker       Vision     Perception     Praxis      Pertinent Vitals/Pain Pain Assessment: Faces Faces Pain Scale: Hurts a little bit Pain Location: general discomfort Pain Descriptors / Indicators: Discomfort Pain Intervention(s): Limited activity within patient's tolerance     Hand Dominance Right   Extremity/Trunk Assessment Upper Extremity Assessment Upper Extremity Assessment: Generalized weakness   Lower Extremity Assessment Lower Extremity Assessment: Defer to PT evaluation   Cervical / Trunk Assessment Cervical / Trunk Assessment: Normal   Communication Communication Communication: No difficulties   Cognition Arousal/Alertness: Awake/alert Behavior During Therapy: WFL for tasks assessed/performed Overall Cognitive Status: No family/caregiver present to determine baseline cognitive functioning                     General Comments       Exercises       Shoulder Instructions      Home Living Family/patient expects to be discharged to:: Skilled nursing facility  Prior Functioning/Environment Level of Independence: Independent with assistive device(s)                 OT Problem List: Decreased strength;Decreased activity tolerance;Impaired balance (sitting and/or standing);Decreased knowledge of use of DME or AE;Decreased safety awareness;Cardiopulmonary status limiting activity;Obesity;Increased edema   OT Treatment/Interventions: Self-care/ADL training;Therapeutic exercise;Energy conservation;DME and/or AE instruction;Therapeutic activities;Patient/family  education;Balance training    OT Goals(Current goals can be found in the care plan section) Acute Rehab OT Goals Patient Stated Goal: to be independent again OT Goal Formulation: With patient Time For Goal Achievement: 04/08/15 Potential to Achieve Goals: Good ADL Goals Pt Will Perform Lower Body Dressing: with min guard assist;sit to/from stand;with adaptive equipment Pt Will Transfer to Toilet: with min assist;stand pivot transfer;bedside commode Pt Will Perform Toileting - Clothing Manipulation and hygiene: with adaptive equipment;sitting/lateral leans;sit to/from stand;with min guard assist Pt/caregiver will Perform Home Exercise Program: Increased strength;Both right and left upper extremity;With theraband;With Supervision (level 2)  OT Frequency: Min 2X/week   Barriers to D/C: Decreased caregiver support          Co-evaluation PT/OT/SLP Co-Evaluation/Treatment: Yes Reason for Co-Treatment: Complexity of the patient's impairments (multi-system involvement);For patient/therapist safety;To address functional/ADL transfers PT goals addressed during session: Mobility/safety with mobility OT goals addressed during session: ADL's and self-care      End of Session Equipment Utilized During Treatment: Gait belt;Rolling walker Nurse Communication: Mobility status;Need for lift equipment  Activity Tolerance: Patient tolerated treatment well Patient left: in chair;with call bell/phone within reach;with chair alarm set   Time: 1412-1446 OT Time Calculation (min): 34 min Charges:  OT General Charges $OT Visit: 1 Procedure OT Evaluation $OT Eval Moderate Complexity: 1 Procedure G-Codes:    Kajol Crispen,HILLARY Apr 14, 2016, 3:39 PM   [facility], OT/L  [phone] 04-14-2016

## 2016-03-23 NOTE — Progress Notes (Signed)
PROGRESS NOTE    Derek Bass  [id number] DOB: 1940-10-09 DOA: 03/17/2016 PCP: [name], MD   Brief Narrative:   75 y.o. BM PMHx prostate cancer S/P seed implants in [place] followed by Dr. Gaynelle Arabian, HTN, HLD, DM type II uncontrolled with complications, Hepatitis (while in the service), Hypothyroidism, Chronic morbid obesity, Chronic lymphedema  Presented from home with confusion after daughter called EMS saying the patient does not seem to be like himself anemia survival patient was unable to stand up his lower extremities noted to be more swollen than usual vital signs blood pressure 97/54 heart rate 140 blood sugar over 306. Family states he was wheezing initially he had severe chills. Family denies any sick contacts. He was incontinent of urine. Denies chest pain.    Examination did not take his medications recently. He hasn't been following up with his primary PCP   Regarding pertinent Chronic problems: She has poorly controlled diabetes with noncompliance to diet.  Regarding chronic lymphedema uses lymphedema pump and dry J compression stockings    Subjective: 12/26   A/O 4. Patient sitting comfortably in bed stating feels significantly improved. Currently negative CP negative SOB.       Assessment & Plan:   Active Problems:   Diabetes type 2, uncontrolled ([place])   HLD (hyperlipidemia)   HTN (hypertension)   Hypothyroidism   Morbid obesity with BMI of 50.0-59.9, adult (HCC)   Lymphedema   Sepsis ([place])   UTI (urinary tract infection)   Cardiomegaly   Acute cystitis with hematuria   Chronic venous stasis dermatitis   Noncompliance with medications   Sepsis secondary to UTI ([place])   Infection due to Enterobacter species   Uncontrolled type 2 diabetes mellitus with complication ([place])   Essential hypertension   Other hyperlipidemia   Other specified hypothyroidism   Chronic venous stasis dermatitis of both lower extremities   Elevated  troponin   Metabolic encephalopathy   Sepsis/Positive ENTEROBACTER SPECIES UTI  -Completed 5 day course antibiotics  -Blood cultures negative   Diabetes type 2, uncontrolled with complication -[id number] Hemoglobin A1c= 10.9 -Levemir 60 units daily -Moderate SSI  Cardiomegaly -Troponins have been trending them. However overnight slight trend up. However patient asymptomatic. Still believed secondary to demand ischemia but will trend troponins. Obtain EKG. If returning up and EKG changes will obtain cardiology consult. -Echocardiogram confirmed cardiomegaly see results below. -Strict I&O since admission + 2.2 L -Daily weight Filed Weights   03/21/16 0500 03/22/16 0600 03/23/16 0600  Weight: (!) 192.8 kg (425 lb) (!) 190.1 kg (419 lb) (!) 188.2 kg (415 lb)   HTN -see tachycardic  Sinus tachycardia -Metoprolol 5 mg QID PRN  -12/24 Increase Metoprolol 50 mg BID (home dose 100 mg BID)  Elevated troponin -Spoke with Dr. Domenic Polite Cardiology and he agrees that atypical presentation but still most likely secondary to demand ischemia. Also agrees that even if true ACS patient extremely poor candidate for any invasive procedure. Recommended patient establish care as outpatient upon discharge.  HLD  -Panel within ADA guideline -Lipitor 40 mg daily  Hypothyroidism  -TSH. WNL -Synthroid 200 g daily  Acute encephalopathy -Resolved  Hypokalemia -Potassium goal> 4  Hypomagnesemia -Magnesium goal> 2  Lymphedema/chronic venous stasis dermatitis -Wound Care consulted; Lymphedema pump only provided in outpatient setting    DVT prophylaxis: Heparin Code Status: Full Family Communication None Disposition Plan: SNF   Consultants:  Phone consult Dr. Domenic Polite Cardiology    Procedures/Significant Events:  12/21 Echocardiogram: Left ventricle: severe focal basal  hypertrophy of the septum with mild posterior wall   hypertrophy. -LVEF = 60% to 65%. - (grade 1 diastolic  dysfunction). 12/21 bilateral lower extremity Doppler: Bilateral lower extremity venous duplex completed. There is no obvious evidence of deep or superficial vein thrombosis involving the right and left lower extremities. All clearly visualized vessels appear patent and compressible. There is no evidence of Baker's cysts bilaterally    VENTILATOR SETTINGS:     Cultures 12/20 blood culture 2 negative 12/21 urine positive ENTEROBACTER SPECIES 12/21 MRSA by PCR negative      Antimicrobials: Anti-infectives    Start     Stop   03/18/16 2200  cefTRIAXone (ROCEPHIN) 2 g in dextrose 5 % 50 mL IVPB     03/22/16 2321   03/17/16 2345  cefTRIAXone (ROCEPHIN) 2 g in dextrose 5 % 50 mL IVPB     03/18/16 0018       Devices    LINES / TUBES:      Continuous Infusions:    Objective: Vitals:   03/23/16 0700 03/23/16 0800 03/23/16 0820 03/23/16 1131  BP: 132/77  132/77 133/72  Pulse: 90 95 96 87  Resp: 16 16 17  (!) 23  Temp: 98.2 F (36.8 C)   98.2 F (36.8 C)  TempSrc: Oral   Oral  SpO2: 97% 95% 97% 96%  Weight:      Height:        Intake/Output Summary (Last 24 hours) at 03/23/16 1226 Last data filed at 03/22/16 2211  Gross per 24 hour  Intake              855 ml  Output             2050 ml  Net            -1195 ml   Filed Weights   03/21/16 0500 03/22/16 0600 03/23/16 0600  Weight: (!) 192.8 kg (425 lb) (!) 190.1 kg (419 lb) (!) 188.2 kg (415 lb)    Examination:  General: A/O 4, NAD, No acute respiratory distress Eyes: negative scleral hemorrhage, negative anisocoria, negative icterus ENT: Negative Runny nose, negative gingival bleeding, Neck:  Negative scars, masses, torticollis, lymphadenopathy, JVD Lungs: Clear to auscultation bilaterally without wheezes or crackles Cardiovascular: Regular rate and rhythm without murmur gallop or rub normal S1 and S2 Abdomen: MORBIDLY OBESE, negative, nondistended, positive soft, bowel sounds, no rebound, no  ascites, no appreciable mass Extremities: positive significant cyanosis, clubbing, positive edema bilateral lower extremities Skin: Negative rashes, lesions, ulcers Psychiatric:  Negative depression, negative anxiety, negative fatigue, negative mania  Central nervous system:  Cranial nerves II through XII intact, tongue/uvula midline, all extremities muscle strength 5/5, sensation intact throughout, negative dysarthria, negative expressive aphasia, negative receptive aphasia.  .     Data Reviewed: Care during the described time interval was provided by me .  I have reviewed this patient's available data, including medical history, events of note, physical examination, and all test results as part of my evaluation. I have personally reviewed and interpreted all radiology studies.  CBC:  Recent Labs Lab 03/19/16 0423 03/20/16 0200 03/21/16 0251 03/22/16 0250 03/23/16 0240  WBC 17.7* 15.7* 10.5 8.2 8.6  NEUTROABS 14.2* 12.5* 6.9 4.4 4.4  HGB 12.2* 11.9* 11.9* 12.1* 12.0*  HCT 36.3* 35.3* 35.1* 36.2* 35.1*  MCV 88.1 89.4 86.9 88.9 87.3  PLT 137* 144* 184 210 0000000   Basic Metabolic Panel:  Recent Labs Lab 03/18/16 0326 03/19/16 0423 03/20/16 0200 03/21/16 0251 03/22/16  0250 03/23/16 0240  NA 138 136 136 136 138 137  K 4.2 3.4* 3.7 3.7 3.6 3.5  CL 106 104 106 106 107 106  CO2 24 25 25 24 24 25   GLUCOSE 270* 125* 131* 123* 110* 121*  BUN 13 14 11 7  5* 5*  CREATININE 1.27* 0.90 0.80 0.64 0.68 0.69  CALCIUM 8.7* 8.6* 8.5* 8.5* 8.8* 8.9  MG 1.4* 1.6* 2.0 2.0 2.1 2.0  PHOS 2.9  --   --   --   --   --    GFR: Estimated Creatinine Clearance: 142.8 mL/min (by C-G formula based on SCr of 0.69 mg/dL). Liver Function Tests:  Recent Labs Lab 03/17/16 2104 03/18/16 0326  AST 41 34  ALT 32 26  ALKPHOS 72 58  BILITOT 1.7* 1.1  PROT 7.2 6.2*  ALBUMIN 3.4* 2.8*   No results for input(s): LIPASE, AMYLASE in the last 168 hours. No results for input(s): AMMONIA in the last 168  hours. Coagulation Profile:  Recent Labs Lab 03/18/16 0326  INR 1.17   Cardiac Enzymes:  Recent Labs Lab 03/22/16 1005 03/22/16 1443 03/22/16 2130 03/23/16 0240 03/23/16 0908  TROPONINI 0.05* 0.03* <0.03 <0.03 <0.03   BNP (last 3 results) No results for input(s): PROBNP in the last 8760 hours. HbA1C: No results for input(s): HGBA1C in the last 72 hours. CBG:  Recent Labs Lab 03/22/16 2013 03/22/16 2353 03/23/16 0414 03/23/16 0817 03/23/16 1135  GLUCAP 123* 137* 106* 122* 146*   Lipid Profile: No results for input(s): CHOL, HDL, LDLCALC, TRIG, CHOLHDL, LDLDIRECT in the last 72 hours. Thyroid Function Tests: No results for input(s): TSH, T4TOTAL, FREET4, T3FREE, THYROIDAB in the last 72 hours. Anemia Panel: No results for input(s): VITAMINB12, FOLATE, FERRITIN, TIBC, IRON, RETICCTPCT in the last 72 hours. Urine analysis:    Component Value Date/Time   COLORURINE AMBER (A) 03/17/2016 2306   APPEARANCEUR HAZY (A) 03/17/2016 2306   LABSPEC 1.016 03/17/2016 2306   PHURINE 5.0 03/17/2016 2306   GLUCOSEU >=500 (A) 03/17/2016 2306   HGBUR LARGE (A) 03/17/2016 2306   HGBUR Neg 04/13/2010 1120   BILIRUBINUR NEGATIVE 03/17/2016 2306   BILIRUBINUR neg 09/19/2012 1125   KETONESUR 5 (A) 03/17/2016 2306   PROTEINUR 30 (A) 03/17/2016 2306   UROBILINOGEN 0.2 09/19/2012 1125   UROBILINOGEN 1.0 04/13/2010 1120   NITRITE NEGATIVE 03/17/2016 2306   LEUKOCYTESUR MODERATE (A) 03/17/2016 2306   Sepsis Labs: @LABRCNTIP (procalcitonin:4,lacticidven:4)  ) Recent Results (from the past 240 hour(s))  Blood culture (routine x 2)     Status: None   Collection Time: 03/17/16 10:32 PM  Result Value Ref Range Status   Specimen Description BLOOD RIGHT HAND  Final   Special Requests BOTTLES DRAWN AEROBIC AND ANAEROBIC 5ML  Final   Culture NO GROWTH 5 DAYS  Final   Report Status 03/22/2016 FINAL  Final  Blood culture (routine x 2)     Status: None   Collection Time: 03/17/16 10:46 PM   Result Value Ref Range Status   Specimen Description BLOOD RIGHT WRIST  Final   Special Requests IN PEDIATRIC BOTTLE 1ML  Final   Culture NO GROWTH 5 DAYS  Final   Report Status 03/22/2016 FINAL  Final  Urine culture     Status: Abnormal   Collection Time: 03/18/16 12:45 AM  Result Value Ref Range Status   Specimen Description URINE, RANDOM  Final   Special Requests NONE  Final   Culture >=100,000 COLONIES/mL ENTEROBACTER SPECIES (A)  Final  Report Status 03/20/2016 FINAL  Final   Organism ID, Bacteria ENTEROBACTER SPECIES (A)  Final      Susceptibility   Enterobacter species - MIC*    CEFAZOLIN <=4 RESISTANT Resistant     CEFTRIAXONE <=1 SENSITIVE Sensitive     CIPROFLOXACIN <=0.25 SENSITIVE Sensitive     GENTAMICIN <=1 SENSITIVE Sensitive     IMIPENEM <=0.25 SENSITIVE Sensitive     NITROFURANTOIN 64 INTERMEDIATE Intermediate     TRIMETH/SULFA <=20 SENSITIVE Sensitive     PIP/TAZO <=4 SENSITIVE Sensitive     * >=100,000 COLONIES/mL ENTEROBACTER SPECIES  MRSA PCR Screening     Status: None   Collection Time: 03/18/16  4:50 AM  Result Value Ref Range Status   MRSA by PCR NEGATIVE NEGATIVE Final    Comment:        The GeneXpert MRSA Assay (FDA approved for NASAL specimens only), is one component of a comprehensive MRSA colonization surveillance program. It is not intended to diagnose MRSA infection nor to guide or monitor treatment for MRSA infections.   Respiratory Panel by PCR     Status: None   Collection Time: 03/18/16  6:00 AM  Result Value Ref Range Status   Adenovirus NOT DETECTED NOT DETECTED Final   Coronavirus 229E NOT DETECTED NOT DETECTED Final   Coronavirus HKU1 NOT DETECTED NOT DETECTED Final   Coronavirus NL63 NOT DETECTED NOT DETECTED Final   Coronavirus OC43 NOT DETECTED NOT DETECTED Final   Metapneumovirus NOT DETECTED NOT DETECTED Final   Rhinovirus / Enterovirus NOT DETECTED NOT DETECTED Final   Influenza A NOT DETECTED NOT DETECTED Final    Influenza B NOT DETECTED NOT DETECTED Final   Parainfluenza Virus 1 NOT DETECTED NOT DETECTED Final   Parainfluenza Virus 2 NOT DETECTED NOT DETECTED Final   Parainfluenza Virus 3 NOT DETECTED NOT DETECTED Final   Parainfluenza Virus 4 NOT DETECTED NOT DETECTED Final   Respiratory Syncytial Virus NOT DETECTED NOT DETECTED Final   Bordetella pertussis NOT DETECTED NOT DETECTED Final   Chlamydophila pneumoniae NOT DETECTED NOT DETECTED Final   Mycoplasma pneumoniae NOT DETECTED NOT DETECTED Final         Radiology Studies: No results found.      Scheduled Meds: . aspirin  325 mg Oral Daily  . atorvastatin  40 mg Oral q1800  . feeding supplement (GLUCERNA SHAKE)  237 mL Oral TID BM  . guaiFENesin  600 mg Oral BID  . heparin  5,000 Units Subcutaneous Q8H  . insulin aspart  0-15 Units Subcutaneous Q4H  . insulin detemir  60 Units Subcutaneous Daily  . levothyroxine  200 mcg Oral QAC breakfast  . metoprolol tartrate  50 mg Oral BID  . senna  1 tablet Oral BID  . sodium chloride flush  3 mL Intravenous Q12H  . tamsulosin  0.4 mg Oral Daily   Continuous Infusions:    LOS: 5 days    Time spent: 40 minutes    Cordelia Bessinger, Geraldo Docker, MD [facility] Pager [phone]   If 7PM-7AM, please contact night-coverage [email or web address] Password [facility] 03/23/2016, 12:26 PM

## 2016-03-24 DIAGNOSIS — M6281 Muscle weakness (generalized): Secondary | ICD-10-CM | POA: Diagnosis not present

## 2016-03-24 DIAGNOSIS — N390 Urinary tract infection, site not specified: Secondary | ICD-10-CM | POA: Diagnosis not present

## 2016-03-24 DIAGNOSIS — E1140 Type 2 diabetes mellitus with diabetic neuropathy, unspecified: Secondary | ICD-10-CM | POA: Diagnosis not present

## 2016-03-24 DIAGNOSIS — E1165 Type 2 diabetes mellitus with hyperglycemia: Secondary | ICD-10-CM | POA: Diagnosis not present

## 2016-03-24 DIAGNOSIS — E118 Type 2 diabetes mellitus with unspecified complications: Secondary | ICD-10-CM | POA: Diagnosis not present

## 2016-03-24 DIAGNOSIS — I8310 Varicose veins of unspecified lower extremity with inflammation: Secondary | ICD-10-CM | POA: Diagnosis not present

## 2016-03-24 DIAGNOSIS — I890 Lymphedema, not elsewhere classified: Secondary | ICD-10-CM | POA: Diagnosis not present

## 2016-03-24 DIAGNOSIS — R41841 Cognitive communication deficit: Secondary | ICD-10-CM | POA: Diagnosis not present

## 2016-03-24 DIAGNOSIS — I10 Essential (primary) hypertension: Secondary | ICD-10-CM | POA: Diagnosis not present

## 2016-03-24 DIAGNOSIS — B9689 Other specified bacterial agents as the cause of diseases classified elsewhere: Secondary | ICD-10-CM | POA: Diagnosis not present

## 2016-03-24 DIAGNOSIS — I872 Venous insufficiency (chronic) (peripheral): Secondary | ICD-10-CM | POA: Diagnosis not present

## 2016-03-24 DIAGNOSIS — R4182 Altered mental status, unspecified: Secondary | ICD-10-CM | POA: Diagnosis not present

## 2016-03-24 DIAGNOSIS — R748 Abnormal levels of other serum enzymes: Secondary | ICD-10-CM | POA: Diagnosis not present

## 2016-03-24 DIAGNOSIS — R531 Weakness: Secondary | ICD-10-CM | POA: Diagnosis not present

## 2016-03-24 DIAGNOSIS — C61 Malignant neoplasm of prostate: Secondary | ICD-10-CM | POA: Diagnosis not present

## 2016-03-24 DIAGNOSIS — E784 Other hyperlipidemia: Secondary | ICD-10-CM | POA: Diagnosis not present

## 2016-03-24 LAB — CBC WITH DIFFERENTIAL/PLATELET
BASOS PCT: 0 %
Basophils Absolute: 0 10*3/uL (ref 0.0–0.1)
EOS PCT: 3 %
Eosinophils Absolute: 0.3 10*3/uL (ref 0.0–0.7)
HCT: 34.3 % — ABNORMAL LOW (ref 39.0–52.0)
HEMOGLOBIN: 11.7 g/dL — AB (ref 13.0–17.0)
LYMPHS PCT: 30 %
Lymphs Abs: 2.8 10*3/uL (ref 0.7–4.0)
MCH: 29.6 pg (ref 26.0–34.0)
MCHC: 34.1 g/dL (ref 30.0–36.0)
MCV: 86.8 fL (ref 78.0–100.0)
MONOS PCT: 11 %
Monocytes Absolute: 1 10*3/uL (ref 0.1–1.0)
NEUTROS ABS: 5.1 10*3/uL (ref 1.7–7.7)
Neutrophils Relative %: 56 %
Platelets: 314 10*3/uL (ref 150–400)
RBC: 3.95 MIL/uL — ABNORMAL LOW (ref 4.22–5.81)
RDW: 13.6 % (ref 11.5–15.5)
WBC: 9.2 10*3/uL (ref 4.0–10.5)

## 2016-03-24 LAB — GLUCOSE, CAPILLARY
GLUCOSE-CAPILLARY: 126 mg/dL — AB (ref 65–99)
GLUCOSE-CAPILLARY: 97 mg/dL (ref 65–99)
Glucose-Capillary: 97 mg/dL (ref 65–99)
Glucose-Capillary: 135 mg/dL — ABNORMAL HIGH (ref 65–99)

## 2016-03-24 LAB — MAGNESIUM: Magnesium: 1.9 mg/dL (ref 1.7–2.4)

## 2016-03-24 LAB — TROPONIN I: Troponin I: <0.03 ng/mL (ref <0.03)

## 2016-03-24 NOTE — Progress Notes (Signed)
Clinical Social Worker facilitated patient discharge including contacting patient family and facility to confirm patient discharge plans.  Clinical information faxed to facility and family agreeable with plan.  CSW arranged ambulance transport via [facility] to [place] .  RN Lattie Haw to call [phone] report prior to discharge.  Clinical Social Worker will sign off for now as social work intervention is no longer needed. Please consult [place] again if new need arises.  Rhea Pink, MSW, [place]

## 2016-03-24 NOTE — Care Management Note (Signed)
Case Management Note Previous CM note initiated by Girard Cooter, RN 03/19/2016, 3:42 PM    Patient Details  Name: [name] MRN: [id number] Date of Birth: [birth year]  Subjective/Objective:    Pt lives alone, uses cane for ambulation, PCP is Dr Danise Mina @ [address].  States he was independent PTA . Had previously been using lymphedema pump which had decreased the edema in his (R) leg and he had become more mobile but stopped regular treatments and was only using pump sporadically, became weaker and less mobile.  PT/OT evals pending.                       Action/Plan: Pt admitted with sepsis, tx from from 4E-SDU to 2W on 03/23/16- CSW has been consulted for SNF placement  Expected Discharge Date:  03/24/16               Expected Discharge Plan:  [place]  In-House Referral:  Clinical Social Work  Discharge planning Services  CM Consult  Post Acute Care Choice:    Choice offered to:     DME Arranged:    DME Agency:     HH Arranged:    [place] Agency:     Status of Service:  Completed, signed off  If discussed at [employer], dates discussed:    Discharge Disposition: skilled facility   Additional Comments:  03/24/16- 1000- Brihana Quickel RN, CM- per CSW pt has a bed at [facility] when medically stable for discharge.   Dahlia Client [place], RN 03/24/2016, 10:10 AM [phone]

## 2016-03-24 NOTE — Progress Notes (Signed)
Report given to [place] at [facility]. Patient to be transported by [facility].

## 2016-03-24 NOTE — Discharge Summary (Signed)
Physician Discharge Summary  Derek Bass [id number] DOB: March 05, 1941 DOA: 03/17/2016  PCP: [name], MD  Admit date: 03/17/2016 Discharge date: 03/24/2016  Admitted From:Home Disposition:SNF  Recommendations for Outpatient Follow-up:  1. Follow up with PCP in 1-2 weeks 2. Please obtain BMP/CBC in one week  Home Health:SNF Equipment/Devices:none Discharge Condition:stable CODE STATUS:full Diet recommendation:Carb modified heart healthy diet.  Brief/Interim Summary:75 y.o.BM PMHx prostate cancer S/P seed implants in [place] followed by Dr. Gaynelle Arabian, HTN, HLD, DM type II uncontrolled with complications,  Hypothyroidism, Chronic morbid obesity, Chronic lymphedema presented from home with confusion after daughter called EMS saying the patient does not seem to be like himself.survival patient was unable to stand up his lower extremities noted to be more swollen than usual vital signs blood pressure 97/54 heart rate 140 blood sugar over 306. Patient did not take his medications recently. He hasn't been following up with his primary PCP.  # Presumed sepsis secondary to Enterobacter urinary tract infection, exact site unspecified: -Patient completed 5 days of antibiotics. Cultures has been negative. Clinically improved.  #Type 2 diabetes, uncontrolled with hyperglycemia: Recommended to continue current dose of insulin and monitor blood sugar level. Advised outpatient follow-up. Education provided to the patient.  #Mildly elevated troponin likely demand ischemia: Echo with heart or megaly. Patient does not have chest pain. As per medical record, it was discussed with cardiologist in the hospital. Recommended medical treatment and outpatient follow-up. As per prior note, it was deemed that patient is poor candidate for any invasive procedure. -Echo with cardiomegaly. Normal left ventricular systolic function.  #Hypertension: Currently on metoprolol for rate control. Holding  lisinopril and Aldactone because of borderline low blood pressure. May need to assess outpatient. Recommended to monitor blood pressure.  #Hyperlipidemia: Continue Lipitor.  #Hypothyroidism: Continue Synthroid.  #Acute metabolic encephalopathy on admission: Improved  #Chronic lower extremity venous stasis dermatitis, lymphedema: Evaluated by wound care team. Recommended lymphedema pump as an outpatient. I discussed with the social worker to follow-up on this during discharge.  Continue other home medication. Patient denied nausea, vomiting, chest pain, shortness of breath. He is medically stable. At this time, patient is medically stable to transfer his care to outpatient.  Discharge Diagnoses:  Active Problems:   Diabetes type 2, uncontrolled ([facility])   HLD (hyperlipidemia)   HTN (hypertension)   Hypothyroidism   Morbid obesity with BMI of 50.0-59.9, adult ([facility])   Lymphedema   Sepsis ([place])   UTI (urinary tract infection)   Cardiomegaly   Acute cystitis with hematuria   Chronic venous stasis dermatitis   Noncompliance with medications   Sepsis secondary to UTI ([place])   Infection due to Enterobacter species   Uncontrolled type 2 diabetes mellitus with complication ([place])   Essential hypertension   Other hyperlipidemia   Other specified hypothyroidism   Chronic venous stasis dermatitis of both lower extremities   Elevated troponin   Metabolic encephalopathy    Discharge Instructions  Discharge Instructions    Call MD for:  difficulty breathing, headache or visual disturbances    Complete by:  As directed    Call MD for:  hives    Complete by:  As directed    Call MD for:  persistant dizziness or light-headedness    Complete by:  As directed    Call MD for:  persistant nausea and vomiting    Complete by:  As directed    Call MD for:  severe uncontrolled pain    Complete by:  As directed  Call MD for:  temperature >100.4    Complete by:  As directed    Diet - low sodium  heart healthy    Complete by:  As directed    Diet Carb Modified    Complete by:  As directed    Increase activity slowly    Complete by:  As directed      Allergies as of 03/24/2016   No Known Allergies     Medication List    STOP taking these medications   quinapril 20 MG tablet Commonly known as:  ACCUPRIL   spironolactone 25 MG tablet Commonly known as:  ALDACTONE     TAKE these medications   acetaminophen 650 MG CR tablet Commonly known as:  TYLENOL Take 650 mg by mouth every 8 (eight) hours as needed for pain.   ammonium lactate 12 % cream Commonly known as:  AMLACTIN APPLY TOPICALLY AS NEEDED.   aspirin 325 MG tablet Take 325 mg by mouth daily.   atorvastatin 40 MG tablet Commonly known as:  LIPITOR Take one tablet daily **MUST HAVE PHYSICAL FOR FURTHER REFILLS**   calcium-vitamin D 500-200 MG-UNIT tablet Commonly known as:  OSCAL WITH D Take 1 tablet by mouth daily.   clotrimazole 1 % cream Commonly known as:  LOTRIMIN APPLY TOPICALLY TWICE A DAY TO INTERDIGITAL WEB SPACES What changed:  Another medication with the same name was removed. Continue taking this medication, and follow the directions you see here.   furosemide 20 MG tablet Commonly known as:  LASIX Take 1 tablet (20 mg total) by mouth 2 (two) times daily as needed.   glucose 4 GM chewable tablet Chew 1 tablet (4 g total) by mouth as needed for low blood sugar.   Insulin Detemir 100 UNIT/ML Pen Commonly known as:  LEVEMIR FLEXTOUCH Inject 60 Units into the skin daily with breakfast.   levothyroxine 200 MCG tablet Commonly known as:  SYNTHROID, LEVOTHROID TAKE 1 TABLET (200 MCG TOTAL) BY MOUTH DAILY.   loperamide 2 MG tablet Commonly known as:  IMODIUM A-D Take 2 mg by mouth as needed.   metFORMIN 1000 MG tablet Commonly known as:  GLUCOPHAGE TAKE 1 TABLET BY MOUTH TWICE A DAY WITH MEALS   metoprolol 100 MG tablet Commonly known as:  LOPRESSOR TAKE 1 TABLET BY MOUTH 2 TIMES  DAILY.   multivitamin capsule Take 1 capsule by mouth daily.   NOVOLIN R 250 units/2.51mL (100 units/mL) injection Generic drug:  insulin regular INJECT 0.1 MLS (10 UNITS TOTAL) INTO THE SKIN 2 (TWO) TIMES DAILY BEFORE A MEAL.   OMEGA 3 PO Take 2 capsules by mouth daily. Reported on 04/14/2015   tamsulosin 0.4 MG Caps capsule Commonly known as:  FLOMAX TAKE ONE CAPSULE BY MOUTH EVERY DAY What changed:  Another medication with the same name was removed. Continue taking this medication, and follow the directions you see here.   urea 40 % Crea Commonly known as:  CARMOL APPLY TO BOTTOMS OF HEELS EVERY DAY AS NEEDED FOR CRACKING HEELS      Follow-up Information    [name], MD. Schedule an appointment as soon as possible for a visit in 1 week(s).   Specialty:  Family Medicine Contact information: [place] [address] [phone]          No Known Allergies  Consultations: Wound care team. Phone consult with cardiologist  Procedures/Studies: Echocardiogram  Subjective: Patient was seen and examined at bedside. Patient reported feeling well. Denied  fever, chills, headache, dizziness, nausea, vomiting. Denied chest pain or shortness of breath. Denied leg pain.   Discharge Exam: Vitals:   03/24/16 0441 03/24/16 0958  BP: (!) 109/59 (!) 103/53  Pulse: 96 95  Resp: 20   Temp: 98.1 F (36.7 C)    Vitals:   03/23/16 2328 03/24/16 0441 03/24/16 0500 03/24/16 0958  BP: 137/68 (!) 109/59  (!) 103/53  Pulse: 87 96  95  Resp: 20 20    Temp: 98.6 F (37 C) 98.1 F (36.7 C)    TempSrc: Oral Oral    SpO2: 97% 98%    Weight: (!) 181.4 kg (400 lb)  (!) 182.3 kg (402 lb)   Height: 6\' 2"  (1.88 m)       General: Overweight male lying in bed, not in distress  Cardiovascular: RRR, S1/S2 +, no rubs, no gallops Respiratory: CTA bilaterally, no wheezing, no rhonchi Abdominal: Soft, NT, ND, bowel sounds + Extremities: Bilateral lower extremity  venous stasis and lymphedema. Neurologic: Alert, awake, following commands.    The results of significant diagnostics from this hospitalization (including imaging, microbiology, ancillary and laboratory) are listed below for reference.     Microbiology: Recent Results (from the past 240 hour(s))  Blood culture (routine x 2)     Status: None   Collection Time: 03/17/16 10:32 PM  Result Value Ref Range Status   Specimen Description BLOOD RIGHT HAND  Final   Special Requests BOTTLES DRAWN AEROBIC AND ANAEROBIC 5ML  Final   Culture NO GROWTH 5 DAYS  Final   Report Status 03/22/2016 FINAL  Final  Blood culture (routine x 2)     Status: None   Collection Time: 03/17/16 10:46 PM  Result Value Ref Range Status   Specimen Description BLOOD RIGHT WRIST  Final   Special Requests IN PEDIATRIC BOTTLE 1ML  Final   Culture NO GROWTH 5 DAYS  Final   Report Status 03/22/2016 FINAL  Final  Urine culture     Status: Abnormal   Collection Time: 03/18/16 12:45 AM  Result Value Ref Range Status   Specimen Description URINE, RANDOM  Final   Special Requests NONE  Final   Culture >=100,000 COLONIES/mL ENTEROBACTER SPECIES (A)  Final   Report Status 03/20/2016 FINAL  Final   Organism ID, Bacteria ENTEROBACTER SPECIES (A)  Final      Susceptibility   Enterobacter species - MIC*    CEFAZOLIN <=4 RESISTANT Resistant     CEFTRIAXONE <=1 SENSITIVE Sensitive     CIPROFLOXACIN <=0.25 SENSITIVE Sensitive     GENTAMICIN <=1 SENSITIVE Sensitive     IMIPENEM <=0.25 SENSITIVE Sensitive     NITROFURANTOIN 64 INTERMEDIATE Intermediate     TRIMETH/SULFA <=20 SENSITIVE Sensitive     PIP/TAZO <=4 SENSITIVE Sensitive     * >=100,000 COLONIES/mL ENTEROBACTER SPECIES  MRSA PCR Screening     Status: None   Collection Time: 03/18/16  4:50 AM  Result Value Ref Range Status   MRSA by PCR NEGATIVE NEGATIVE Final    Comment:        The GeneXpert MRSA Assay (FDA approved for NASAL specimens only), is one component of  a comprehensive MRSA colonization surveillance program. It is not intended to diagnose MRSA infection nor to guide or monitor treatment for MRSA infections.   Respiratory Panel by PCR     Status: None   Collection Time: 03/18/16  6:00 AM  Result Value Ref Range Status   Adenovirus NOT DETECTED NOT DETECTED Final  Coronavirus 229E NOT DETECTED NOT DETECTED Final   Coronavirus HKU1 NOT DETECTED NOT DETECTED Final   Coronavirus NL63 NOT DETECTED NOT DETECTED Final   Coronavirus OC43 NOT DETECTED NOT DETECTED Final   Metapneumovirus NOT DETECTED NOT DETECTED Final   Rhinovirus / Enterovirus NOT DETECTED NOT DETECTED Final   Influenza A NOT DETECTED NOT DETECTED Final   Influenza B NOT DETECTED NOT DETECTED Final   Parainfluenza Virus 1 NOT DETECTED NOT DETECTED Final   Parainfluenza Virus 2 NOT DETECTED NOT DETECTED Final   Parainfluenza Virus 3 NOT DETECTED NOT DETECTED Final   Parainfluenza Virus 4 NOT DETECTED NOT DETECTED Final   Respiratory Syncytial Virus NOT DETECTED NOT DETECTED Final   Bordetella pertussis NOT DETECTED NOT DETECTED Final   Chlamydophila pneumoniae NOT DETECTED NOT DETECTED Final   Mycoplasma pneumoniae NOT DETECTED NOT DETECTED Final     Labs: BNP (last 3 results) No results for input(s): BNP in the last 8760 hours. Basic Metabolic Panel:  Recent Labs Lab 03/18/16 0326 03/19/16 0423 03/20/16 0200 03/21/16 0251 03/22/16 0250 03/23/16 0240 03/24/16 0342  NA 138 136 136 136 138 137  --   K 4.2 3.4* 3.7 3.7 3.6 3.5  --   CL 106 104 106 106 107 106  --   CO2 24 25 25 24 24 25   --   GLUCOSE 270* 125* 131* 123* 110* 121*  --   BUN 13 14 11 7  5* 5*  --   CREATININE 1.27* 0.90 0.80 0.64 0.68 0.69  --   CALCIUM 8.7* 8.6* 8.5* 8.5* 8.8* 8.9  --   MG 1.4* 1.6* 2.0 2.0 2.1 2.0 1.9  PHOS 2.9  --   --   --   --   --   --    Liver Function Tests:  Recent Labs Lab 03/17/16 2104 03/18/16 0326  AST 41 34  ALT 32 26  ALKPHOS 72 58  BILITOT 1.7* 1.1   PROT 7.2 6.2*  ALBUMIN 3.4* 2.8*   No results for input(s): LIPASE, AMYLASE in the last 168 hours. No results for input(s): AMMONIA in the last 168 hours. CBC:  Recent Labs Lab 03/20/16 0200 03/21/16 0251 03/22/16 0250 03/23/16 0240 03/24/16 0342  WBC 15.7* 10.5 8.2 8.6 9.2  NEUTROABS 12.5* 6.9 4.4 4.4 5.1  HGB 11.9* 11.9* 12.1* 12.0* 11.7*  HCT 35.3* 35.1* 36.2* 35.1* 34.3*  MCV 89.4 86.9 88.9 87.3 86.8  PLT 144* 184 210 243 314   Cardiac Enzymes:  Recent Labs Lab 03/23/16 0240 03/23/16 0908 03/23/16 1634 03/23/16 2100 03/24/16 0342  TROPONINI <0.03 <0.03 <0.03 <0.03 <0.03   BNP: Invalid input(s): POCBNP CBG:  Recent Labs Lab 03/23/16 2028 03/24/16 0040 03/24/16 0444 03/24/16 0822 03/24/16 1150  GLUCAP 150* 97 97 135* 126*   D-Dimer No results for input(s): DDIMER in the last 72 hours. Hgb A1c No results for input(s): HGBA1C in the last 72 hours. Lipid Profile No results for input(s): CHOL, HDL, LDLCALC, TRIG, CHOLHDL, LDLDIRECT in the last 72 hours. Thyroid function studies No results for input(s): TSH, T4TOTAL, T3FREE, THYROIDAB in the last 72 hours.  Invalid input(s): FREET3 Anemia work up No results for input(s): VITAMINB12, FOLATE, FERRITIN, TIBC, IRON, RETICCTPCT in the last 72 hours. Urinalysis    Component Value Date/Time   COLORURINE AMBER (A) 03/17/2016 2306   APPEARANCEUR HAZY (A) 03/17/2016 2306   LABSPEC 1.016 03/17/2016 2306   PHURINE 5.0 03/17/2016 2306   GLUCOSEU >=500 (A) 03/17/2016 2306   HGBUR LARGE (  A) 03/17/2016 2306   HGBUR Neg 04/13/2010 1120   BILIRUBINUR NEGATIVE 03/17/2016 2306   BILIRUBINUR neg 09/19/2012 1125   KETONESUR 5 (A) 03/17/2016 2306   PROTEINUR 30 (A) 03/17/2016 2306   UROBILINOGEN 0.2 09/19/2012 1125   UROBILINOGEN 1.0 04/13/2010 1120   NITRITE NEGATIVE 03/17/2016 2306   LEUKOCYTESUR MODERATE (A) 03/17/2016 2306   Sepsis Labs Invalid input(s): PROCALCITONIN,  WBC,  LACTICIDVEN Microbiology Recent  Results (from the past 240 hour(s))  Blood culture (routine x 2)     Status: None   Collection Time: 03/17/16 10:32 PM  Result Value Ref Range Status   Specimen Description BLOOD RIGHT HAND  Final   Special Requests BOTTLES DRAWN AEROBIC AND ANAEROBIC 5ML  Final   Culture NO GROWTH 5 DAYS  Final   Report Status 03/22/2016 FINAL  Final  Blood culture (routine x 2)     Status: None   Collection Time: 03/17/16 10:46 PM  Result Value Ref Range Status   Specimen Description BLOOD RIGHT WRIST  Final   Special Requests IN PEDIATRIC BOTTLE 1ML  Final   Culture NO GROWTH 5 DAYS  Final   Report Status 03/22/2016 FINAL  Final  Urine culture     Status: Abnormal   Collection Time: 03/18/16 12:45 AM  Result Value Ref Range Status   Specimen Description URINE, RANDOM  Final   Special Requests NONE  Final   Culture >=100,000 COLONIES/mL ENTEROBACTER SPECIES (A)  Final   Report Status 03/20/2016 FINAL  Final   Organism ID, Bacteria ENTEROBACTER SPECIES (A)  Final      Susceptibility   Enterobacter species - MIC*    CEFAZOLIN <=4 RESISTANT Resistant     CEFTRIAXONE <=1 SENSITIVE Sensitive     CIPROFLOXACIN <=0.25 SENSITIVE Sensitive     GENTAMICIN <=1 SENSITIVE Sensitive     IMIPENEM <=0.25 SENSITIVE Sensitive     NITROFURANTOIN 64 INTERMEDIATE Intermediate     TRIMETH/SULFA <=20 SENSITIVE Sensitive     PIP/TAZO <=4 SENSITIVE Sensitive     * >=100,000 COLONIES/mL ENTEROBACTER SPECIES  MRSA PCR Screening     Status: None   Collection Time: 03/18/16  4:50 AM  Result Value Ref Range Status   MRSA by PCR NEGATIVE NEGATIVE Final    Comment:        The GeneXpert MRSA Assay (FDA approved for NASAL specimens only), is one component of a comprehensive MRSA colonization surveillance program. It is not intended to diagnose MRSA infection nor to guide or monitor treatment for MRSA infections.   Respiratory Panel by PCR     Status: None   Collection Time: 03/18/16  6:00 AM  Result Value Ref  Range Status   Adenovirus NOT DETECTED NOT DETECTED Final   Coronavirus 229E NOT DETECTED NOT DETECTED Final   Coronavirus HKU1 NOT DETECTED NOT DETECTED Final   Coronavirus NL63 NOT DETECTED NOT DETECTED Final   Coronavirus OC43 NOT DETECTED NOT DETECTED Final   Metapneumovirus NOT DETECTED NOT DETECTED Final   Rhinovirus / Enterovirus NOT DETECTED NOT DETECTED Final   Influenza A NOT DETECTED NOT DETECTED Final   Influenza B NOT DETECTED NOT DETECTED Final   Parainfluenza Virus 1 NOT DETECTED NOT DETECTED Final   Parainfluenza Virus 2 NOT DETECTED NOT DETECTED Final   Parainfluenza Virus 3 NOT DETECTED NOT DETECTED Final   Parainfluenza Virus 4 NOT DETECTED NOT DETECTED Final   Respiratory Syncytial Virus NOT DETECTED NOT DETECTED Final   Bordetella pertussis NOT DETECTED NOT DETECTED Final  Chlamydophila pneumoniae NOT DETECTED NOT DETECTED Final   Mycoplasma pneumoniae NOT DETECTED NOT DETECTED Final     Time coordinating discharge: Over 30 minutes  SIGNED:   Rosita Fire, MD  [facility] 03/24/2016, 11:59 AM  If 7PM-7AM, please contact night-coverage [email or web address] Password [email or web address]

## 2016-03-31 ENCOUNTER — Telehealth: Payer: Self-pay | Admitting: Family Medicine

## 2016-03-31 ENCOUNTER — Ambulatory Visit (INDEPENDENT_AMBULATORY_CARE_PROVIDER_SITE_OTHER): Payer: Medicare HMO | Admitting: Family Medicine

## 2016-03-31 DIAGNOSIS — Z0289 Encounter for other administrative examinations: Secondary | ICD-10-CM

## 2016-03-31 NOTE — Telephone Encounter (Signed)
Patient did not come in for their appointment today for swollen legs. Please let me know if patient needs to be contacted immediately for follow up or no follow up needed.

## 2016-04-01 DIAGNOSIS — I10 Essential (primary) hypertension: Secondary | ICD-10-CM | POA: Diagnosis not present

## 2016-04-01 DIAGNOSIS — E1140 Type 2 diabetes mellitus with diabetic neuropathy, unspecified: Secondary | ICD-10-CM | POA: Diagnosis not present

## 2016-04-01 DIAGNOSIS — I890 Lymphedema, not elsewhere classified: Secondary | ICD-10-CM | POA: Diagnosis not present

## 2016-04-01 DIAGNOSIS — N390 Urinary tract infection, site not specified: Secondary | ICD-10-CM | POA: Diagnosis not present

## 2016-04-01 DIAGNOSIS — C61 Malignant neoplasm of prostate: Secondary | ICD-10-CM | POA: Diagnosis not present

## 2016-04-01 DIAGNOSIS — I8310 Varicose veins of unspecified lower extremity with inflammation: Secondary | ICD-10-CM | POA: Diagnosis not present

## 2016-04-01 DIAGNOSIS — R531 Weakness: Secondary | ICD-10-CM | POA: Diagnosis not present

## 2016-04-01 NOTE — Telephone Encounter (Signed)
plz call to schedule f/u. Needs f/u after hospital admission.

## 2016-04-05 NOTE — Telephone Encounter (Signed)
Called to rs appt, no answer mf [phone]

## 2016-04-06 ENCOUNTER — Other Ambulatory Visit: Payer: Self-pay | Admitting: Family Medicine

## 2016-04-06 DIAGNOSIS — Z125 Encounter for screening for malignant neoplasm of prostate: Secondary | ICD-10-CM

## 2016-04-06 DIAGNOSIS — E1100 Type 2 diabetes mellitus with hyperosmolarity without nonketotic hyperglycemic-hyperosmolar coma (NKHHC): Secondary | ICD-10-CM

## 2016-04-08 ENCOUNTER — Ambulatory Visit: Payer: Self-pay

## 2016-04-12 DIAGNOSIS — E1165 Type 2 diabetes mellitus with hyperglycemia: Secondary | ICD-10-CM | POA: Diagnosis not present

## 2016-04-12 DIAGNOSIS — I890 Lymphedema, not elsewhere classified: Secondary | ICD-10-CM | POA: Diagnosis not present

## 2016-04-13 ENCOUNTER — Ambulatory Visit: Payer: Self-pay | Admitting: Family Medicine

## 2016-04-13 DIAGNOSIS — E109 Type 1 diabetes mellitus without complications: Secondary | ICD-10-CM | POA: Diagnosis not present

## 2016-04-15 ENCOUNTER — Encounter: Payer: Self-pay | Admitting: Family Medicine

## 2016-04-17 DIAGNOSIS — I872 Venous insufficiency (chronic) (peripheral): Secondary | ICD-10-CM | POA: Diagnosis not present

## 2016-04-17 DIAGNOSIS — M545 Low back pain: Secondary | ICD-10-CM | POA: Diagnosis not present

## 2016-04-17 DIAGNOSIS — E860 Dehydration: Secondary | ICD-10-CM | POA: Diagnosis not present

## 2016-04-17 DIAGNOSIS — E1165 Type 2 diabetes mellitus with hyperglycemia: Secondary | ICD-10-CM | POA: Diagnosis not present

## 2016-04-17 DIAGNOSIS — I517 Cardiomegaly: Secondary | ICD-10-CM | POA: Diagnosis not present

## 2016-04-17 DIAGNOSIS — E881 Lipodystrophy, not elsewhere classified: Secondary | ICD-10-CM | POA: Diagnosis not present

## 2016-04-17 DIAGNOSIS — I890 Lymphedema, not elsewhere classified: Secondary | ICD-10-CM | POA: Diagnosis not present

## 2016-04-17 DIAGNOSIS — I10 Essential (primary) hypertension: Secondary | ICD-10-CM | POA: Diagnosis not present

## 2016-04-19 DIAGNOSIS — E881 Lipodystrophy, not elsewhere classified: Secondary | ICD-10-CM | POA: Diagnosis not present

## 2016-04-19 DIAGNOSIS — I517 Cardiomegaly: Secondary | ICD-10-CM | POA: Diagnosis not present

## 2016-04-19 DIAGNOSIS — E860 Dehydration: Secondary | ICD-10-CM | POA: Diagnosis not present

## 2016-04-19 DIAGNOSIS — M545 Low back pain: Secondary | ICD-10-CM | POA: Diagnosis not present

## 2016-04-19 DIAGNOSIS — E1165 Type 2 diabetes mellitus with hyperglycemia: Secondary | ICD-10-CM | POA: Diagnosis not present

## 2016-04-19 DIAGNOSIS — I872 Venous insufficiency (chronic) (peripheral): Secondary | ICD-10-CM | POA: Diagnosis not present

## 2016-04-19 DIAGNOSIS — I890 Lymphedema, not elsewhere classified: Secondary | ICD-10-CM | POA: Diagnosis not present

## 2016-04-19 DIAGNOSIS — I10 Essential (primary) hypertension: Secondary | ICD-10-CM | POA: Diagnosis not present

## 2016-04-20 ENCOUNTER — Telehealth: Payer: Self-pay

## 2016-04-20 DIAGNOSIS — E881 Lipodystrophy, not elsewhere classified: Secondary | ICD-10-CM | POA: Diagnosis not present

## 2016-04-20 DIAGNOSIS — I890 Lymphedema, not elsewhere classified: Secondary | ICD-10-CM | POA: Diagnosis not present

## 2016-04-20 DIAGNOSIS — I872 Venous insufficiency (chronic) (peripheral): Secondary | ICD-10-CM | POA: Diagnosis not present

## 2016-04-20 DIAGNOSIS — E860 Dehydration: Secondary | ICD-10-CM | POA: Diagnosis not present

## 2016-04-20 DIAGNOSIS — I10 Essential (primary) hypertension: Secondary | ICD-10-CM | POA: Diagnosis not present

## 2016-04-20 DIAGNOSIS — E1165 Type 2 diabetes mellitus with hyperglycemia: Secondary | ICD-10-CM | POA: Diagnosis not present

## 2016-04-20 DIAGNOSIS — I517 Cardiomegaly: Secondary | ICD-10-CM | POA: Diagnosis not present

## 2016-04-20 DIAGNOSIS — M545 Low back pain: Secondary | ICD-10-CM | POA: Diagnosis not present

## 2016-04-20 NOTE — Telephone Encounter (Signed)
Pt called requesting refills on quinapril and spironolactone; pt was recently discharged from hospital and rehab. The home health nurse was at pts home and is trying to help pt decide what medications he is supposed to be taking. Pt scheduled 30 min f/u appt on 04/22/16 at 11:30 with Dr Darnell Level ; if pt needs anything prior to that appt pt will cb. FYI to Dr Darnell Level.

## 2016-04-21 DIAGNOSIS — I10 Essential (primary) hypertension: Secondary | ICD-10-CM | POA: Diagnosis not present

## 2016-04-21 DIAGNOSIS — E860 Dehydration: Secondary | ICD-10-CM | POA: Diagnosis not present

## 2016-04-21 DIAGNOSIS — E881 Lipodystrophy, not elsewhere classified: Secondary | ICD-10-CM | POA: Diagnosis not present

## 2016-04-21 DIAGNOSIS — M545 Low back pain: Secondary | ICD-10-CM | POA: Diagnosis not present

## 2016-04-21 DIAGNOSIS — E1165 Type 2 diabetes mellitus with hyperglycemia: Secondary | ICD-10-CM | POA: Diagnosis not present

## 2016-04-21 DIAGNOSIS — I872 Venous insufficiency (chronic) (peripheral): Secondary | ICD-10-CM | POA: Diagnosis not present

## 2016-04-21 DIAGNOSIS — I890 Lymphedema, not elsewhere classified: Secondary | ICD-10-CM | POA: Diagnosis not present

## 2016-04-21 DIAGNOSIS — I517 Cardiomegaly: Secondary | ICD-10-CM | POA: Diagnosis not present

## 2016-04-22 ENCOUNTER — Encounter: Payer: Self-pay | Admitting: Family Medicine

## 2016-04-22 ENCOUNTER — Ambulatory Visit (INDEPENDENT_AMBULATORY_CARE_PROVIDER_SITE_OTHER): Payer: Medicare HMO | Admitting: Family Medicine

## 2016-04-22 VITALS — BP 130/70 | HR 98 | Wt >= 6400 oz

## 2016-04-22 DIAGNOSIS — N390 Urinary tract infection, site not specified: Secondary | ICD-10-CM

## 2016-04-22 DIAGNOSIS — E039 Hypothyroidism, unspecified: Secondary | ICD-10-CM

## 2016-04-22 DIAGNOSIS — E881 Lipodystrophy, not elsewhere classified: Secondary | ICD-10-CM | POA: Diagnosis not present

## 2016-04-22 DIAGNOSIS — Z23 Encounter for immunization: Secondary | ICD-10-CM | POA: Diagnosis not present

## 2016-04-22 DIAGNOSIS — Z6841 Body Mass Index (BMI) 40.0 and over, adult: Secondary | ICD-10-CM

## 2016-04-22 DIAGNOSIS — E860 Dehydration: Secondary | ICD-10-CM | POA: Diagnosis not present

## 2016-04-22 DIAGNOSIS — A498 Other bacterial infections of unspecified site: Secondary | ICD-10-CM

## 2016-04-22 DIAGNOSIS — I10 Essential (primary) hypertension: Secondary | ICD-10-CM | POA: Diagnosis not present

## 2016-04-22 DIAGNOSIS — A419 Sepsis, unspecified organism: Secondary | ICD-10-CM

## 2016-04-22 DIAGNOSIS — Z794 Long term (current) use of insulin: Secondary | ICD-10-CM

## 2016-04-22 DIAGNOSIS — E118 Type 2 diabetes mellitus with unspecified complications: Secondary | ICD-10-CM | POA: Diagnosis not present

## 2016-04-22 DIAGNOSIS — I517 Cardiomegaly: Secondary | ICD-10-CM | POA: Diagnosis not present

## 2016-04-22 DIAGNOSIS — E1165 Type 2 diabetes mellitus with hyperglycemia: Secondary | ICD-10-CM

## 2016-04-22 DIAGNOSIS — I890 Lymphedema, not elsewhere classified: Secondary | ICD-10-CM | POA: Diagnosis not present

## 2016-04-22 DIAGNOSIS — Z9114 Patient's other noncompliance with medication regimen: Secondary | ICD-10-CM

## 2016-04-22 DIAGNOSIS — B9689 Other specified bacterial agents as the cause of diseases classified elsewhere: Secondary | ICD-10-CM

## 2016-04-22 DIAGNOSIS — M545 Low back pain: Secondary | ICD-10-CM | POA: Diagnosis not present

## 2016-04-22 DIAGNOSIS — I872 Venous insufficiency (chronic) (peripheral): Secondary | ICD-10-CM | POA: Diagnosis not present

## 2016-04-22 DIAGNOSIS — IMO0002 Reserved for concepts with insufficient information to code with codable children: Secondary | ICD-10-CM

## 2016-04-22 LAB — COMPREHENSIVE METABOLIC PANEL
ALT: 17 U/L (ref 0–53)
AST: 20 U/L (ref 0–37)
Albumin: 3.7 g/dL (ref 3.5–5.2)
Alkaline Phosphatase: 89 U/L (ref 39–117)
BUN: 12 mg/dL (ref 6–23)
CALCIUM: 9.8 mg/dL (ref 8.4–10.5)
CHLORIDE: 103 meq/L (ref 96–112)
CO2: 31 meq/L (ref 19–32)
CREATININE: 0.69 mg/dL (ref 0.40–1.50)
GFR: 143.8 mL/min (ref >60.00)
Glucose, Bld: 64 mg/dL — ABNORMAL LOW (ref 70–99)
POTASSIUM: 4.1 meq/L (ref 3.5–5.1)
Sodium: 140 mEq/L (ref 135–145)
Total Bilirubin: 0.5 mg/dL (ref 0.2–1.2)
Total Protein: 7.8 g/dL (ref 6.0–8.3)

## 2016-04-22 LAB — CBC WITH DIFFERENTIAL/PLATELET
BASOS PCT: 0.4 % (ref 0.0–3.0)
Basophils Absolute: 0 10*3/uL (ref 0.0–0.1)
EOS ABS: 0.3 10*3/uL (ref 0.0–0.7)
EOS PCT: 3.1 % (ref 0.0–5.0)
HEMATOCRIT: 38.7 % — AB (ref 39.0–52.0)
Hemoglobin: 13 g/dL (ref 13.0–17.0)
LYMPHS PCT: 39.9 % (ref 12.0–46.0)
Lymphs Abs: 3.5 10*3/uL (ref 0.7–4.0)
MCHC: 33.5 g/dL (ref 30.0–36.0)
MCV: 87.6 fl (ref 78.0–100.0)
MONO ABS: 1 10*3/uL (ref 0.1–1.0)
Monocytes Relative: 11.2 % (ref 3.0–12.0)
NEUTROS ABS: 4 10*3/uL (ref 1.4–7.7)
Neutrophils Relative %: 45.4 % (ref 43.0–77.0)
PLATELETS: 195 10*3/uL (ref 150.0–400.0)
RBC: 4.42 Mil/uL (ref 4.22–5.81)
RDW: 14.8 % (ref 11.5–15.5)
WBC: 8.8 10*3/uL (ref 4.0–10.5)

## 2016-04-22 MED ORDER — INSULIN REGULAR HUMAN 100 UNIT/ML IJ SOLN: 10.0000 [IU] | Freq: Two times a day (BID) | INTRAMUSCULAR | 3 refills | Status: DC

## 2016-04-22 MED ORDER — ATORVASTATIN CALCIUM 40 MG PO TABS: 40.0000 mg | ORAL_TABLET | Freq: Every day | ORAL | 3 refills | Status: AC

## 2016-04-22 NOTE — Progress Notes (Signed)
BP 130/70   Pulse 98   Wt (!) 407 lb (184.6 kg)   SpO2 98%   BMI 52.26 kg/m    CC: SNF/hosp f/u visit Subjective:    Patient ID: Derek Bass, male    DOB: Jun 09, 1940, 76 y.o.   MRN: [id number]  HPI: Derek Bass is a 76 y.o. male presenting on 04/22/2016 for Follow-up (hospital and SNF)   Missed last 2 appointments. Here with grand daughter Angelica Pou. Was at SNF, then last week returned home.   Hospitalization last month with metabolic encephalopathy presumed from urosepsis due to Enterobacter UTI. Records reviewed. Down 46 lbs since last time seen here.   Compliant with levemir 60u QAM, novolin R 10u BID AC. Fasting readings 120-140s Lab Results  Component Value Date   HGBA1C 10.9 (H) 03/18/2016    Went to [facility].  Currently with [facility] HH services.  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   Admit date: 03/17/2016 Discharge date: 03/24/2016  Admitted From:Home Disposition:SNF  Recommendations for Outpatient Follow-up:  1. Follow up with PCP in 1-2 weeks 2. Please obtain BMP/CBC in one week  Home Health:SNF Equipment/Devices:none Discharge Condition:stable CODE STATUS:full Diet recommendation:Carb modified heart healthy diet.  Relevant past medical, surgical, family and social history reviewed and updated as indicated. Interim medical history since our last visit reviewed. Allergies and medications reviewed and updated. Current Outpatient Prescriptions on File Prior to Visit  Medication Sig  . acetaminophen (TYLENOL) 650 MG CR tablet Take 650 mg by mouth every 8 (eight) hours as needed for pain.   Marland Kitchen ammonium lactate (AMLACTIN) 12 % cream APPLY TOPICALLY AS NEEDED.  Marland Kitchen aspirin 325 MG tablet Take 325 mg by mouth daily.  . calcium-vitamin D (OSCAL WITH D) 500-200 MG-UNIT per tablet Take 1 tablet by mouth daily.  . clotrimazole (LOTRIMIN) 1 % cream APPLY TOPICALLY TWICE A DAY TO INTERDIGITAL WEB SPACES  . furosemide (LASIX) 20 MG  tablet Take 20 mg by mouth 2 (two) times daily.   Marland Kitchen glucose 4 GM chewable tablet Chew 1 tablet (4 g total) by mouth as needed for low blood sugar.  . Insulin Detemir (LEVEMIR FLEXTOUCH) 100 UNIT/ML Pen Inject 60 Units into the skin daily with breakfast.  . levothyroxine (SYNTHROID, LEVOTHROID) 200 MCG tablet TAKE 1 TABLET (200 MCG TOTAL) BY MOUTH DAILY.  Marland Kitchen loperamide (IMODIUM A-D) 2 MG tablet Take 2 mg by mouth as needed.  . metFORMIN (GLUCOPHAGE) 1000 MG tablet TAKE 1 TABLET BY MOUTH TWICE A DAY WITH MEALS  . metoprolol (LOPRESSOR) 100 MG tablet TAKE 1 TABLET BY MOUTH 2 TIMES DAILY.  . Multiple Vitamin (MULTIVITAMIN) capsule Take 1 capsule by mouth daily.  . Omega-3 Fatty Acids (OMEGA 3 PO) Take 2 capsules by mouth daily. Reported on 04/14/2015  . tamsulosin (FLOMAX) 0.4 MG CAPS capsule TAKE ONE CAPSULE BY MOUTH EVERY DAY  . urea (CARMOL) 40 % CREA APPLY TO BOTTOMS OF HEELS EVERY DAY AS NEEDED FOR CRACKING HEELS   No current facility-administered medications on file prior to visit.     Review of Systems Per HPI unless specifically indicated in ROS section     Objective:    BP 130/70   Pulse 98   Wt (!) 407 lb (184.6 kg)   SpO2 98%   BMI 52.26 kg/m   Wt Readings from Last 3 Encounters:  04/22/16 (!) 407 lb (184.6 kg)  03/24/16 (!) 402 lb (182.3 kg)  04/14/15 (!) 453 lb 4  oz (205.6 kg)    Physical Exam  Constitutional: He appears well-developed and well-nourished. No distress.  Walks with walker  HENT:  Mouth/Throat: Oropharynx is clear and moist. No oropharyngeal exudate.  Cardiovascular: Normal rate, regular rhythm, normal heart sounds and intact distal pulses.   No murmur heard. Pulmonary/Chest: Effort normal and breath sounds normal. No respiratory distress. He has no wheezes. He has no rales.  Musculoskeletal: He exhibits edema (marked lymphedema).  pachydermia  Skin: Skin is warm and dry. No rash noted.  Nursing note and vitals reviewed.  Lab Results  Component Value  Date   CREATININE 0.69 03/23/2016   Lab Results  Component Value Date   TSH 3.482 03/20/2016       Assessment & Plan:   Problem List Items Addressed This Visit    Diabetes type 2, uncontrolled ([place]) - Primary    Pt reports compliance with insulin regimen and metformin.  Check fructosamine today a well as Cr      Relevant Medications   atorvastatin (LIPITOR) 40 MG tablet   insulin regular (NOVOLIN R) 250 units/2.72mL (100 units/mL) injection   Other Relevant Orders   Comprehensive metabolic panel   Fructosamine   HTN (hypertension)    Doing well off spironolactone and quinapril. Continue current regimen of lasix 20mg  BID and metoprolol 100mg  bid.       Relevant Medications   atorvastatin (LIPITOR) 40 MG tablet   Hypothyroidism    Chronic, stable - continue levothyroxine 210mcg daily.      RESOLVED: Infection due to Enterobacter species   LIPODYSTROPHY   Lymphedema of both lower extremities    Severe. Not currently using lymphedema pump. Previously saw [facility] Dr Delana Meyer.       Relevant Orders   CBC with Differential/Platelet   Comprehensive metabolic panel   Morbid obesity with BMI of 50.0-59.9, adult ([place])    Reviewed weight loss noted, encouraged continued working towards sustainable weight loss.      Relevant Medications   insulin regular (NOVOLIN R) 250 units/2.61mL (100 units/mL) injection   Noncompliance with medications    Encouraged compliance with meds and discussed importance of keeping appts.       Sepsis secondary to UTI ([place])    Seems fully resolved from recent urosepsis s/p completing abx course.        Other Visit Diagnoses    Need for influenza vaccination       Relevant Orders   Flu Vaccine QUAD 36+ mos PF IM (Fluarix & Fluzone Quad PF) (Completed)       Follow up plan: Return in about 2 months (around 06/20/2016) for annual exam, prior fasting for blood work, medicare wellness visit.  [name], MD

## 2016-04-22 NOTE — Telephone Encounter (Signed)
Will see then. Missed last 2 appointment.s

## 2016-04-22 NOTE — Progress Notes (Signed)
Pre visit review using our clinic review tool, if applicable. No additional management support is needed unless otherwise documented below in the visit note. 

## 2016-04-22 NOTE — Assessment & Plan Note (Signed)
Severe. Not currently using lymphedema pump. Previously saw [facility] Dr Delana Meyer.

## 2016-04-22 NOTE — Assessment & Plan Note (Signed)
Chronic, stable - continue levothyroxine 213mcg daily.

## 2016-04-22 NOTE — Assessment & Plan Note (Signed)
Reviewed weight loss noted, encouraged continued working towards sustainable weight loss.

## 2016-04-22 NOTE — Assessment & Plan Note (Addendum)
Seems fully resolved from recent urosepsis s/p completing abx course.

## 2016-04-22 NOTE — Assessment & Plan Note (Addendum)
Pt reports compliance with insulin regimen and metformin.  Check fructosamine today a well as Cr

## 2016-04-22 NOTE — Assessment & Plan Note (Signed)
Doing well off spironolactone and quinapril. Continue current regimen of lasix 20mg  BID and metoprolol 100mg  bid.

## 2016-04-22 NOTE — Assessment & Plan Note (Signed)
Encouraged compliance with meds and discussed importance of keeping appts.

## 2016-04-22 NOTE — Patient Instructions (Addendum)
Flu shot today Continue current medicines, now off spironolactone and quinapril.  Check labs today. Check into diabetic shoes. Good to see you today, call us with questions. Return for wellness visit and physical in 2-3 months

## 2016-04-23 DIAGNOSIS — I872 Venous insufficiency (chronic) (peripheral): Secondary | ICD-10-CM | POA: Diagnosis not present

## 2016-04-23 DIAGNOSIS — M545 Low back pain: Secondary | ICD-10-CM | POA: Diagnosis not present

## 2016-04-23 DIAGNOSIS — E881 Lipodystrophy, not elsewhere classified: Secondary | ICD-10-CM | POA: Diagnosis not present

## 2016-04-23 DIAGNOSIS — I10 Essential (primary) hypertension: Secondary | ICD-10-CM | POA: Diagnosis not present

## 2016-04-23 DIAGNOSIS — E1165 Type 2 diabetes mellitus with hyperglycemia: Secondary | ICD-10-CM | POA: Diagnosis not present

## 2016-04-23 DIAGNOSIS — I517 Cardiomegaly: Secondary | ICD-10-CM | POA: Diagnosis not present

## 2016-04-23 DIAGNOSIS — E860 Dehydration: Secondary | ICD-10-CM | POA: Diagnosis not present

## 2016-04-23 DIAGNOSIS — I890 Lymphedema, not elsewhere classified: Secondary | ICD-10-CM | POA: Diagnosis not present

## 2016-04-26 LAB — FRUCTOSAMINE: Fructosamine: 233 umol/L (ref 190–270)

## 2016-04-27 ENCOUNTER — Other Ambulatory Visit: Payer: Self-pay | Admitting: Family Medicine

## 2016-04-27 MED ORDER — INSULIN DETEMIR 100 UNIT/ML FLEXPEN: 55.0000 [IU] | PEN_INJECTOR | Freq: Every day | SUBCUTANEOUS | 6 refills | Status: DC

## 2016-04-28 DIAGNOSIS — I517 Cardiomegaly: Secondary | ICD-10-CM | POA: Diagnosis not present

## 2016-04-28 DIAGNOSIS — I10 Essential (primary) hypertension: Secondary | ICD-10-CM | POA: Diagnosis not present

## 2016-04-28 DIAGNOSIS — M545 Low back pain: Secondary | ICD-10-CM | POA: Diagnosis not present

## 2016-04-28 DIAGNOSIS — I890 Lymphedema, not elsewhere classified: Secondary | ICD-10-CM | POA: Diagnosis not present

## 2016-04-28 DIAGNOSIS — E1165 Type 2 diabetes mellitus with hyperglycemia: Secondary | ICD-10-CM | POA: Diagnosis not present

## 2016-04-28 DIAGNOSIS — E860 Dehydration: Secondary | ICD-10-CM | POA: Diagnosis not present

## 2016-04-28 DIAGNOSIS — I872 Venous insufficiency (chronic) (peripheral): Secondary | ICD-10-CM | POA: Diagnosis not present

## 2016-04-28 DIAGNOSIS — E881 Lipodystrophy, not elsewhere classified: Secondary | ICD-10-CM | POA: Diagnosis not present

## 2016-04-30 DIAGNOSIS — I10 Essential (primary) hypertension: Secondary | ICD-10-CM | POA: Diagnosis not present

## 2016-04-30 DIAGNOSIS — M545 Low back pain: Secondary | ICD-10-CM | POA: Diagnosis not present

## 2016-04-30 DIAGNOSIS — E860 Dehydration: Secondary | ICD-10-CM | POA: Diagnosis not present

## 2016-04-30 DIAGNOSIS — I890 Lymphedema, not elsewhere classified: Secondary | ICD-10-CM | POA: Diagnosis not present

## 2016-04-30 DIAGNOSIS — E1165 Type 2 diabetes mellitus with hyperglycemia: Secondary | ICD-10-CM | POA: Diagnosis not present

## 2016-04-30 DIAGNOSIS — I872 Venous insufficiency (chronic) (peripheral): Secondary | ICD-10-CM | POA: Diagnosis not present

## 2016-04-30 DIAGNOSIS — E881 Lipodystrophy, not elsewhere classified: Secondary | ICD-10-CM | POA: Diagnosis not present

## 2016-04-30 DIAGNOSIS — I517 Cardiomegaly: Secondary | ICD-10-CM | POA: Diagnosis not present

## 2016-05-03 ENCOUNTER — Telehealth: Payer: Self-pay

## 2016-05-03 NOTE — Telephone Encounter (Signed)
Agree with this. Thanks.  

## 2016-05-03 NOTE — Telephone Encounter (Signed)
[place] PT with [facility] left v/m requesting verbal orders to make up 2 missed visits last week for [facility] PT.

## 2016-05-04 NOTE — Telephone Encounter (Signed)
Message left advising [place].

## 2016-05-05 DIAGNOSIS — I10 Essential (primary) hypertension: Secondary | ICD-10-CM | POA: Diagnosis not present

## 2016-05-05 DIAGNOSIS — E1165 Type 2 diabetes mellitus with hyperglycemia: Secondary | ICD-10-CM | POA: Diagnosis not present

## 2016-05-05 DIAGNOSIS — E860 Dehydration: Secondary | ICD-10-CM | POA: Diagnosis not present

## 2016-05-05 DIAGNOSIS — I872 Venous insufficiency (chronic) (peripheral): Secondary | ICD-10-CM | POA: Diagnosis not present

## 2016-05-05 DIAGNOSIS — I890 Lymphedema, not elsewhere classified: Secondary | ICD-10-CM | POA: Diagnosis not present

## 2016-05-05 DIAGNOSIS — M545 Low back pain: Secondary | ICD-10-CM | POA: Diagnosis not present

## 2016-05-05 DIAGNOSIS — I517 Cardiomegaly: Secondary | ICD-10-CM | POA: Diagnosis not present

## 2016-05-05 DIAGNOSIS — E881 Lipodystrophy, not elsewhere classified: Secondary | ICD-10-CM | POA: Diagnosis not present

## 2016-05-08 ENCOUNTER — Other Ambulatory Visit: Payer: Self-pay | Admitting: Family Medicine

## 2016-05-10 ENCOUNTER — Telehealth: Payer: Self-pay

## 2016-05-10 DIAGNOSIS — M545 Low back pain: Secondary | ICD-10-CM | POA: Diagnosis not present

## 2016-05-10 DIAGNOSIS — I890 Lymphedema, not elsewhere classified: Secondary | ICD-10-CM | POA: Diagnosis not present

## 2016-05-10 DIAGNOSIS — I872 Venous insufficiency (chronic) (peripheral): Secondary | ICD-10-CM | POA: Diagnosis not present

## 2016-05-10 DIAGNOSIS — I10 Essential (primary) hypertension: Secondary | ICD-10-CM | POA: Diagnosis not present

## 2016-05-10 DIAGNOSIS — I517 Cardiomegaly: Secondary | ICD-10-CM | POA: Diagnosis not present

## 2016-05-10 DIAGNOSIS — E881 Lipodystrophy, not elsewhere classified: Secondary | ICD-10-CM | POA: Diagnosis not present

## 2016-05-10 DIAGNOSIS — E860 Dehydration: Secondary | ICD-10-CM | POA: Diagnosis not present

## 2016-05-10 DIAGNOSIS — E1165 Type 2 diabetes mellitus with hyperglycemia: Secondary | ICD-10-CM | POA: Diagnosis not present

## 2016-05-10 NOTE — Telephone Encounter (Signed)
Pt left a message on triage line saying someone was coming in to the house telling him Dr G had stopped his medications. He sounded very upset.

## 2016-05-10 NOTE — Telephone Encounter (Signed)
Spoke to pt. He said he got a call from his daughter saying the therapist called her and that he would be coming to visit the pt between 2 and 3. He said he had called his previous therapist to let the new therapist know he was not feeling well and did not feel like doing PT. He must not have gotten the message and still showed up with his previous PT Therapist. The male PT told him if he did not cooperate that they would stop his medication. He has not called the [facility] agency. I suggested he call them and tell them the therapist that came today threatened him with his medications if he did not work with them. He will call them.

## 2016-05-11 ENCOUNTER — Telehealth: Payer: Self-pay

## 2016-05-11 NOTE — Telephone Encounter (Signed)
Pt left v/m; pt is upset and does not want his daughter( but then says not his real daughter; her name is Veva Holes) to have any authority to take care of pts business. Pt is at home and the daughter is making verbal threats in ref to pt health. If pt does not go along with what she and the physical therapist is saying they will withhold pts medication. Pt said he is walking with the walker and pt is doing as good as he can. Pt wants the [facility] nurse to come but does not want physical therapist to come any more). Please see phone note 05/10/16.  [facility]. Pt said someone at [facility](pt does not know the persons name at [facility]) said that to get any refills pt would have to contact Dr Danise Mina. Pt is going to change his locks at home so they just come up into his house. Pt request cb.

## 2016-05-11 NOTE — Telephone Encounter (Signed)
Message left for [facility] with [facility] to return my call to investigate this further.   We do not have control over who he has given authority to or not given authority to outside of our office. We do not have a DPR on file for this person and will not give her any info on him.   Advised patient that [facility] cannot withhold meds from him regardless if he does PT or not. They do not buy his meds and are not in control of his meds. I [facility] and made him aware of this as well.  He said his daughter is the one threatening him saying if he doesn't do the therapy, then she won't give him his meds. I advised that she cannot withhold them from him, and that he has the right to refuse PT. I told him it was in his best interest to do it, however. He verbalized understanding.

## 2016-05-12 DIAGNOSIS — I872 Venous insufficiency (chronic) (peripheral): Secondary | ICD-10-CM | POA: Diagnosis not present

## 2016-05-12 DIAGNOSIS — I890 Lymphedema, not elsewhere classified: Secondary | ICD-10-CM | POA: Diagnosis not present

## 2016-05-12 DIAGNOSIS — E860 Dehydration: Secondary | ICD-10-CM | POA: Diagnosis not present

## 2016-05-12 DIAGNOSIS — I517 Cardiomegaly: Secondary | ICD-10-CM | POA: Diagnosis not present

## 2016-05-12 DIAGNOSIS — M545 Low back pain: Secondary | ICD-10-CM | POA: Diagnosis not present

## 2016-05-12 DIAGNOSIS — I10 Essential (primary) hypertension: Secondary | ICD-10-CM | POA: Diagnosis not present

## 2016-05-12 DIAGNOSIS — E1165 Type 2 diabetes mellitus with hyperglycemia: Secondary | ICD-10-CM | POA: Diagnosis not present

## 2016-05-12 DIAGNOSIS — E881 Lipodystrophy, not elsewhere classified: Secondary | ICD-10-CM | POA: Diagnosis not present

## 2016-05-13 DIAGNOSIS — E1165 Type 2 diabetes mellitus with hyperglycemia: Secondary | ICD-10-CM | POA: Diagnosis not present

## 2016-05-13 DIAGNOSIS — I890 Lymphedema, not elsewhere classified: Secondary | ICD-10-CM | POA: Diagnosis not present

## 2016-05-17 ENCOUNTER — Telehealth: Payer: Self-pay

## 2016-05-17 NOTE — Telephone Encounter (Signed)
Therapist, Patrick Jupiter, calls to request a verbal okay to make a missed therapy appointment with patient from last week.  Verbal okay given.

## 2016-05-18 NOTE — Telephone Encounter (Signed)
Agree. Thanks

## 2016-05-19 ENCOUNTER — Other Ambulatory Visit: Payer: Self-pay | Admitting: Family Medicine

## 2016-05-19 DIAGNOSIS — I517 Cardiomegaly: Secondary | ICD-10-CM | POA: Diagnosis not present

## 2016-05-19 DIAGNOSIS — E860 Dehydration: Secondary | ICD-10-CM | POA: Diagnosis not present

## 2016-05-19 DIAGNOSIS — E881 Lipodystrophy, not elsewhere classified: Secondary | ICD-10-CM | POA: Diagnosis not present

## 2016-05-19 DIAGNOSIS — E1165 Type 2 diabetes mellitus with hyperglycemia: Secondary | ICD-10-CM | POA: Diagnosis not present

## 2016-05-19 DIAGNOSIS — I872 Venous insufficiency (chronic) (peripheral): Secondary | ICD-10-CM | POA: Diagnosis not present

## 2016-05-19 DIAGNOSIS — M545 Low back pain: Secondary | ICD-10-CM | POA: Diagnosis not present

## 2016-05-19 DIAGNOSIS — I10 Essential (primary) hypertension: Secondary | ICD-10-CM | POA: Diagnosis not present

## 2016-05-19 DIAGNOSIS — I890 Lymphedema, not elsewhere classified: Secondary | ICD-10-CM | POA: Diagnosis not present

## 2016-05-26 DIAGNOSIS — I10 Essential (primary) hypertension: Secondary | ICD-10-CM | POA: Diagnosis not present

## 2016-05-26 DIAGNOSIS — E1165 Type 2 diabetes mellitus with hyperglycemia: Secondary | ICD-10-CM | POA: Diagnosis not present

## 2016-05-26 DIAGNOSIS — I517 Cardiomegaly: Secondary | ICD-10-CM | POA: Diagnosis not present

## 2016-05-26 DIAGNOSIS — I890 Lymphedema, not elsewhere classified: Secondary | ICD-10-CM | POA: Diagnosis not present

## 2016-05-26 DIAGNOSIS — I872 Venous insufficiency (chronic) (peripheral): Secondary | ICD-10-CM | POA: Diagnosis not present

## 2016-05-26 DIAGNOSIS — E860 Dehydration: Secondary | ICD-10-CM | POA: Diagnosis not present

## 2016-05-26 DIAGNOSIS — M545 Low back pain: Secondary | ICD-10-CM | POA: Diagnosis not present

## 2016-05-26 DIAGNOSIS — E881 Lipodystrophy, not elsewhere classified: Secondary | ICD-10-CM | POA: Diagnosis not present

## 2016-05-28 ENCOUNTER — Other Ambulatory Visit: Payer: Self-pay | Admitting: Family Medicine

## 2016-06-02 DIAGNOSIS — E881 Lipodystrophy, not elsewhere classified: Secondary | ICD-10-CM | POA: Diagnosis not present

## 2016-06-02 DIAGNOSIS — E860 Dehydration: Secondary | ICD-10-CM | POA: Diagnosis not present

## 2016-06-02 DIAGNOSIS — I872 Venous insufficiency (chronic) (peripheral): Secondary | ICD-10-CM | POA: Diagnosis not present

## 2016-06-02 DIAGNOSIS — M545 Low back pain: Secondary | ICD-10-CM | POA: Diagnosis not present

## 2016-06-02 DIAGNOSIS — I890 Lymphedema, not elsewhere classified: Secondary | ICD-10-CM | POA: Diagnosis not present

## 2016-06-02 DIAGNOSIS — I517 Cardiomegaly: Secondary | ICD-10-CM | POA: Diagnosis not present

## 2016-06-02 DIAGNOSIS — I10 Essential (primary) hypertension: Secondary | ICD-10-CM | POA: Diagnosis not present

## 2016-06-02 DIAGNOSIS — E1165 Type 2 diabetes mellitus with hyperglycemia: Secondary | ICD-10-CM | POA: Diagnosis not present

## 2016-06-09 ENCOUNTER — Telehealth: Payer: Self-pay | Admitting: *Deleted

## 2016-06-09 DIAGNOSIS — E1165 Type 2 diabetes mellitus with hyperglycemia: Secondary | ICD-10-CM | POA: Diagnosis not present

## 2016-06-09 DIAGNOSIS — E860 Dehydration: Secondary | ICD-10-CM | POA: Diagnosis not present

## 2016-06-09 DIAGNOSIS — E881 Lipodystrophy, not elsewhere classified: Secondary | ICD-10-CM | POA: Diagnosis not present

## 2016-06-09 DIAGNOSIS — I10 Essential (primary) hypertension: Secondary | ICD-10-CM | POA: Diagnosis not present

## 2016-06-09 DIAGNOSIS — I890 Lymphedema, not elsewhere classified: Secondary | ICD-10-CM | POA: Diagnosis not present

## 2016-06-09 DIAGNOSIS — M545 Low back pain: Secondary | ICD-10-CM | POA: Diagnosis not present

## 2016-06-09 DIAGNOSIS — I872 Venous insufficiency (chronic) (peripheral): Secondary | ICD-10-CM | POA: Diagnosis not present

## 2016-06-09 DIAGNOSIS — I517 Cardiomegaly: Secondary | ICD-10-CM | POA: Diagnosis not present

## 2016-06-09 NOTE — Telephone Encounter (Signed)
Agree with this. Thanks.  

## 2016-06-09 NOTE — Telephone Encounter (Signed)
[facility] left a voicemail stating that they have been providing home health to patient since he was discharged from the hospital. Their services are thru Tuesday. Patient has a new walk in shower that he is afraid to use. [facility] requested an order to provide occupational therapy and possibly a home health aid to help patient with this. [facility] stated that they have someone that can go out tomorrow to provide this service. Please call with verbal order.

## 2016-06-10 DIAGNOSIS — I872 Venous insufficiency (chronic) (peripheral): Secondary | ICD-10-CM | POA: Diagnosis not present

## 2016-06-10 DIAGNOSIS — E1165 Type 2 diabetes mellitus with hyperglycemia: Secondary | ICD-10-CM | POA: Diagnosis not present

## 2016-06-10 DIAGNOSIS — I890 Lymphedema, not elsewhere classified: Secondary | ICD-10-CM | POA: Diagnosis not present

## 2016-06-10 DIAGNOSIS — I517 Cardiomegaly: Secondary | ICD-10-CM | POA: Diagnosis not present

## 2016-06-10 DIAGNOSIS — M545 Low back pain: Secondary | ICD-10-CM | POA: Diagnosis not present

## 2016-06-10 DIAGNOSIS — E860 Dehydration: Secondary | ICD-10-CM | POA: Diagnosis not present

## 2016-06-10 DIAGNOSIS — E881 Lipodystrophy, not elsewhere classified: Secondary | ICD-10-CM | POA: Diagnosis not present

## 2016-06-10 DIAGNOSIS — I10 Essential (primary) hypertension: Secondary | ICD-10-CM | POA: Diagnosis not present

## 2016-06-10 NOTE — Telephone Encounter (Signed)
Linda notified. 

## 2016-06-11 DIAGNOSIS — I10 Essential (primary) hypertension: Secondary | ICD-10-CM | POA: Diagnosis not present

## 2016-06-11 DIAGNOSIS — I890 Lymphedema, not elsewhere classified: Secondary | ICD-10-CM | POA: Diagnosis not present

## 2016-06-11 DIAGNOSIS — E1165 Type 2 diabetes mellitus with hyperglycemia: Secondary | ICD-10-CM | POA: Diagnosis not present

## 2016-06-11 DIAGNOSIS — I517 Cardiomegaly: Secondary | ICD-10-CM | POA: Diagnosis not present

## 2016-06-11 DIAGNOSIS — E881 Lipodystrophy, not elsewhere classified: Secondary | ICD-10-CM | POA: Diagnosis not present

## 2016-06-11 DIAGNOSIS — E860 Dehydration: Secondary | ICD-10-CM | POA: Diagnosis not present

## 2016-06-11 DIAGNOSIS — I872 Venous insufficiency (chronic) (peripheral): Secondary | ICD-10-CM | POA: Diagnosis not present

## 2016-06-11 DIAGNOSIS — M545 Low back pain: Secondary | ICD-10-CM | POA: Diagnosis not present

## 2016-06-14 DIAGNOSIS — I517 Cardiomegaly: Secondary | ICD-10-CM | POA: Diagnosis not present

## 2016-06-14 DIAGNOSIS — I872 Venous insufficiency (chronic) (peripheral): Secondary | ICD-10-CM | POA: Diagnosis not present

## 2016-06-14 DIAGNOSIS — I890 Lymphedema, not elsewhere classified: Secondary | ICD-10-CM | POA: Diagnosis not present

## 2016-06-14 DIAGNOSIS — I10 Essential (primary) hypertension: Secondary | ICD-10-CM | POA: Diagnosis not present

## 2016-06-14 DIAGNOSIS — E881 Lipodystrophy, not elsewhere classified: Secondary | ICD-10-CM | POA: Diagnosis not present

## 2016-06-14 DIAGNOSIS — M545 Low back pain: Secondary | ICD-10-CM | POA: Diagnosis not present

## 2016-06-14 DIAGNOSIS — E1165 Type 2 diabetes mellitus with hyperglycemia: Secondary | ICD-10-CM | POA: Diagnosis not present

## 2016-06-14 DIAGNOSIS — E860 Dehydration: Secondary | ICD-10-CM | POA: Diagnosis not present

## 2016-06-15 ENCOUNTER — Other Ambulatory Visit: Payer: Self-pay | Admitting: Family Medicine

## 2016-06-15 DIAGNOSIS — M545 Low back pain: Secondary | ICD-10-CM | POA: Diagnosis not present

## 2016-06-15 DIAGNOSIS — I890 Lymphedema, not elsewhere classified: Secondary | ICD-10-CM | POA: Diagnosis not present

## 2016-06-15 DIAGNOSIS — E881 Lipodystrophy, not elsewhere classified: Secondary | ICD-10-CM | POA: Diagnosis not present

## 2016-06-15 DIAGNOSIS — E860 Dehydration: Secondary | ICD-10-CM | POA: Diagnosis not present

## 2016-06-15 DIAGNOSIS — I517 Cardiomegaly: Secondary | ICD-10-CM | POA: Diagnosis not present

## 2016-06-15 DIAGNOSIS — I872 Venous insufficiency (chronic) (peripheral): Secondary | ICD-10-CM | POA: Diagnosis not present

## 2016-06-15 DIAGNOSIS — E1165 Type 2 diabetes mellitus with hyperglycemia: Secondary | ICD-10-CM | POA: Diagnosis not present

## 2016-06-15 DIAGNOSIS — I10 Essential (primary) hypertension: Secondary | ICD-10-CM | POA: Diagnosis not present

## 2016-06-22 ENCOUNTER — Other Ambulatory Visit: Payer: Self-pay | Admitting: Family Medicine

## 2016-07-11 DIAGNOSIS — E1165 Type 2 diabetes mellitus with hyperglycemia: Secondary | ICD-10-CM | POA: Diagnosis not present

## 2016-07-11 DIAGNOSIS — I890 Lymphedema, not elsewhere classified: Secondary | ICD-10-CM | POA: Diagnosis not present

## 2016-07-12 DIAGNOSIS — E109 Type 1 diabetes mellitus without complications: Secondary | ICD-10-CM | POA: Diagnosis not present

## 2016-08-10 DIAGNOSIS — E1165 Type 2 diabetes mellitus with hyperglycemia: Secondary | ICD-10-CM | POA: Diagnosis not present

## 2016-08-10 DIAGNOSIS — I890 Lymphedema, not elsewhere classified: Secondary | ICD-10-CM | POA: Diagnosis not present

## 2016-09-10 DIAGNOSIS — E1165 Type 2 diabetes mellitus with hyperglycemia: Secondary | ICD-10-CM | POA: Diagnosis not present

## 2016-09-10 DIAGNOSIS — I890 Lymphedema, not elsewhere classified: Secondary | ICD-10-CM | POA: Diagnosis not present

## 2016-10-10 DIAGNOSIS — I890 Lymphedema, not elsewhere classified: Secondary | ICD-10-CM | POA: Diagnosis not present

## 2016-10-10 DIAGNOSIS — E1165 Type 2 diabetes mellitus with hyperglycemia: Secondary | ICD-10-CM | POA: Diagnosis not present

## 2016-10-11 DIAGNOSIS — E109 Type 1 diabetes mellitus without complications: Secondary | ICD-10-CM | POA: Diagnosis not present

## 2016-11-10 DIAGNOSIS — I890 Lymphedema, not elsewhere classified: Secondary | ICD-10-CM | POA: Diagnosis not present

## 2016-11-10 DIAGNOSIS — E1165 Type 2 diabetes mellitus with hyperglycemia: Secondary | ICD-10-CM | POA: Diagnosis not present

## 2016-11-19 ENCOUNTER — Encounter (HOSPITAL_COMMUNITY): Payer: Self-pay | Admitting: Emergency Medicine

## 2016-11-19 DIAGNOSIS — I890 Lymphedema, not elsewhere classified: Secondary | ICD-10-CM | POA: Diagnosis not present

## 2016-11-19 DIAGNOSIS — N179 Acute kidney failure, unspecified: Secondary | ICD-10-CM | POA: Diagnosis not present

## 2016-11-19 DIAGNOSIS — F4320 Adjustment disorder, unspecified: Secondary | ICD-10-CM | POA: Diagnosis not present

## 2016-11-19 DIAGNOSIS — N400 Enlarged prostate without lower urinary tract symptoms: Secondary | ICD-10-CM | POA: Diagnosis present

## 2016-11-19 DIAGNOSIS — E876 Hypokalemia: Secondary | ICD-10-CM | POA: Diagnosis present

## 2016-11-19 DIAGNOSIS — Z8546 Personal history of malignant neoplasm of prostate: Secondary | ICD-10-CM | POA: Diagnosis not present

## 2016-11-19 DIAGNOSIS — Z79899 Other long term (current) drug therapy: Secondary | ICD-10-CM | POA: Diagnosis not present

## 2016-11-19 DIAGNOSIS — Z7982 Long term (current) use of aspirin: Secondary | ICD-10-CM

## 2016-11-19 DIAGNOSIS — R079 Chest pain, unspecified: Secondary | ICD-10-CM | POA: Diagnosis not present

## 2016-11-19 DIAGNOSIS — Z1619 Resistance to other specified beta lactam antibiotics: Secondary | ICD-10-CM | POA: Diagnosis present

## 2016-11-19 DIAGNOSIS — I10 Essential (primary) hypertension: Secondary | ICD-10-CM | POA: Diagnosis not present

## 2016-11-19 DIAGNOSIS — L03116 Cellulitis of left lower limb: Principal | ICD-10-CM | POA: Diagnosis present

## 2016-11-19 DIAGNOSIS — R609 Edema, unspecified: Secondary | ICD-10-CM | POA: Diagnosis not present

## 2016-11-19 DIAGNOSIS — L03115 Cellulitis of right lower limb: Secondary | ICD-10-CM | POA: Diagnosis present

## 2016-11-19 DIAGNOSIS — I872 Venous insufficiency (chronic) (peripheral): Secondary | ICD-10-CM | POA: Diagnosis not present

## 2016-11-19 DIAGNOSIS — E039 Hypothyroidism, unspecified: Secondary | ICD-10-CM | POA: Diagnosis present

## 2016-11-19 DIAGNOSIS — N390 Urinary tract infection, site not specified: Secondary | ICD-10-CM | POA: Diagnosis present

## 2016-11-19 DIAGNOSIS — Z794 Long term (current) use of insulin: Secondary | ICD-10-CM | POA: Diagnosis not present

## 2016-11-19 DIAGNOSIS — E1165 Type 2 diabetes mellitus with hyperglycemia: Secondary | ICD-10-CM | POA: Diagnosis present

## 2016-11-19 DIAGNOSIS — Z6841 Body Mass Index (BMI) 40.0 and over, adult: Secondary | ICD-10-CM | POA: Diagnosis not present

## 2016-11-19 DIAGNOSIS — I110 Hypertensive heart disease with heart failure: Secondary | ICD-10-CM | POA: Diagnosis present

## 2016-11-19 DIAGNOSIS — Z87891 Personal history of nicotine dependence: Secondary | ICD-10-CM

## 2016-11-19 DIAGNOSIS — Z9114 Patient's other noncompliance with medication regimen: Secondary | ICD-10-CM | POA: Diagnosis not present

## 2016-11-19 DIAGNOSIS — I5032 Chronic diastolic (congestive) heart failure: Secondary | ICD-10-CM | POA: Diagnosis not present

## 2016-11-19 DIAGNOSIS — B9689 Other specified bacterial agents as the cause of diseases classified elsewhere: Secondary | ICD-10-CM | POA: Diagnosis present

## 2016-11-19 DIAGNOSIS — R9431 Abnormal electrocardiogram [ECG] [EKG]: Secondary | ICD-10-CM | POA: Diagnosis not present

## 2016-11-19 DIAGNOSIS — E785 Hyperlipidemia, unspecified: Secondary | ICD-10-CM | POA: Diagnosis present

## 2016-11-19 DIAGNOSIS — S3993XA Unspecified injury of pelvis, initial encounter: Secondary | ICD-10-CM | POA: Diagnosis not present

## 2016-11-19 DIAGNOSIS — S0990XA Unspecified injury of head, initial encounter: Secondary | ICD-10-CM | POA: Diagnosis not present

## 2016-11-19 DIAGNOSIS — M7989 Other specified soft tissue disorders: Secondary | ICD-10-CM | POA: Diagnosis not present

## 2016-11-19 DIAGNOSIS — E118 Type 2 diabetes mellitus with unspecified complications: Secondary | ICD-10-CM | POA: Diagnosis not present

## 2016-11-19 DIAGNOSIS — I5040 Unspecified combined systolic (congestive) and diastolic (congestive) heart failure: Secondary | ICD-10-CM | POA: Diagnosis not present

## 2016-11-19 DIAGNOSIS — G8911 Acute pain due to trauma: Secondary | ICD-10-CM | POA: Diagnosis not present

## 2016-11-19 LAB — CBG MONITORING, ED: GLUCOSE-CAPILLARY: 155 mg/dL — AB (ref 65–99)

## 2016-11-19 NOTE — ED Triage Notes (Signed)
Pt reports weakness and dizziness since Tuesday, states he fell and Tuesday and has been on the floor since. Family found him and called 911. Covered in urine. Alertx4. Denies pain.

## 2016-11-20 ENCOUNTER — Encounter (HOSPITAL_COMMUNITY): Payer: Self-pay | Admitting: Internal Medicine

## 2016-11-20 ENCOUNTER — Inpatient Hospital Stay (HOSPITAL_COMMUNITY): Payer: Medicare HMO

## 2016-11-20 ENCOUNTER — Emergency Department (HOSPITAL_COMMUNITY): Payer: Medicare HMO

## 2016-11-20 ENCOUNTER — Inpatient Hospital Stay (HOSPITAL_COMMUNITY)
Admission: EM | Admit: 2016-11-20 | Discharge: 2016-11-23 | DRG: 603 | Disposition: A | Discharge status: Home Health | Payer: Medicare HMO | Attending: Internal Medicine | Admitting: Internal Medicine

## 2016-11-20 DIAGNOSIS — Z794 Long term (current) use of insulin: Secondary | ICD-10-CM

## 2016-11-20 DIAGNOSIS — Z9114 Patient's other noncompliance with medication regimen: Secondary | ICD-10-CM | POA: Diagnosis not present

## 2016-11-20 DIAGNOSIS — E785 Hyperlipidemia, unspecified: Secondary | ICD-10-CM | POA: Diagnosis present

## 2016-11-20 DIAGNOSIS — L0390 Cellulitis, unspecified: Secondary | ICD-10-CM

## 2016-11-20 DIAGNOSIS — Z1619 Resistance to other specified beta lactam antibiotics: Secondary | ICD-10-CM | POA: Diagnosis present

## 2016-11-20 DIAGNOSIS — E118 Type 2 diabetes mellitus with unspecified complications: Secondary | ICD-10-CM | POA: Diagnosis not present

## 2016-11-20 DIAGNOSIS — IMO0002 Reserved for concepts with insufficient information to code with codable children: Secondary | ICD-10-CM | POA: Diagnosis present

## 2016-11-20 DIAGNOSIS — B9689 Other specified bacterial agents as the cause of diseases classified elsewhere: Secondary | ICD-10-CM | POA: Diagnosis present

## 2016-11-20 DIAGNOSIS — I890 Lymphedema, not elsewhere classified: Secondary | ICD-10-CM

## 2016-11-20 DIAGNOSIS — N390 Urinary tract infection, site not specified: Secondary | ICD-10-CM | POA: Diagnosis present

## 2016-11-20 DIAGNOSIS — I10 Essential (primary) hypertension: Secondary | ICD-10-CM | POA: Diagnosis present

## 2016-11-20 DIAGNOSIS — N179 Acute kidney failure, unspecified: Secondary | ICD-10-CM | POA: Diagnosis present

## 2016-11-20 DIAGNOSIS — E1165 Type 2 diabetes mellitus with hyperglycemia: Secondary | ICD-10-CM | POA: Diagnosis present

## 2016-11-20 DIAGNOSIS — Z6841 Body Mass Index (BMI) 40.0 and over, adult: Secondary | ICD-10-CM

## 2016-11-20 DIAGNOSIS — E876 Hypokalemia: Secondary | ICD-10-CM | POA: Diagnosis present

## 2016-11-20 DIAGNOSIS — I110 Hypertensive heart disease with heart failure: Secondary | ICD-10-CM | POA: Diagnosis present

## 2016-11-20 DIAGNOSIS — Z87891 Personal history of nicotine dependence: Secondary | ICD-10-CM | POA: Diagnosis not present

## 2016-11-20 DIAGNOSIS — I5032 Chronic diastolic (congestive) heart failure: Secondary | ICD-10-CM | POA: Diagnosis present

## 2016-11-20 DIAGNOSIS — L03116 Cellulitis of left lower limb: Secondary | ICD-10-CM | POA: Diagnosis present

## 2016-11-20 DIAGNOSIS — F4320 Adjustment disorder, unspecified: Secondary | ICD-10-CM | POA: Diagnosis not present

## 2016-11-20 DIAGNOSIS — E039 Hypothyroidism, unspecified: Secondary | ICD-10-CM

## 2016-11-20 DIAGNOSIS — R609 Edema, unspecified: Secondary | ICD-10-CM

## 2016-11-20 DIAGNOSIS — N400 Enlarged prostate without lower urinary tract symptoms: Secondary | ICD-10-CM | POA: Diagnosis present

## 2016-11-20 DIAGNOSIS — Z79899 Other long term (current) drug therapy: Secondary | ICD-10-CM | POA: Diagnosis not present

## 2016-11-20 DIAGNOSIS — Z7982 Long term (current) use of aspirin: Secondary | ICD-10-CM | POA: Diagnosis not present

## 2016-11-20 DIAGNOSIS — B952 Enterococcus as the cause of diseases classified elsewhere: Secondary | ICD-10-CM | POA: Diagnosis present

## 2016-11-20 DIAGNOSIS — M7989 Other specified soft tissue disorders: Secondary | ICD-10-CM | POA: Diagnosis present

## 2016-11-20 DIAGNOSIS — L03115 Cellulitis of right lower limb: Secondary | ICD-10-CM | POA: Diagnosis present

## 2016-11-20 DIAGNOSIS — Z8546 Personal history of malignant neoplasm of prostate: Secondary | ICD-10-CM | POA: Diagnosis not present

## 2016-11-20 DIAGNOSIS — I872 Venous insufficiency (chronic) (peripheral): Secondary | ICD-10-CM | POA: Diagnosis present

## 2016-11-20 HISTORY — DX: Cellulitis, unspecified: L03.90

## 2016-11-20 HISTORY — DX: Morbid (severe) obesity due to excess calories: E66.01

## 2016-11-20 HISTORY — DX: Lymphedema, not elsewhere classified: I89.0

## 2016-11-20 HISTORY — DX: Body Mass Index (BMI) 40.0 and over, adult: Z684

## 2016-11-20 LAB — COMPREHENSIVE METABOLIC PANEL
ALBUMIN: 3.2 g/dL — AB (ref 3.5–5.0)
ALT: 29 U/L (ref 17–63)
AST: 29 U/L (ref 15–41)
Alkaline Phosphatase: 66 U/L (ref 38–126)
Anion gap: 11 (ref 5–15)
BILIRUBIN TOTAL: 1.8 mg/dL — AB (ref 0.3–1.2)
BUN: 12 mg/dL (ref 6–20)
CO2: 26 mmol/L (ref 22–32)
Calcium: 9.1 mg/dL (ref 8.9–10.3)
Chloride: 104 mmol/L (ref 101–111)
Creatinine, Ser: 0.87 mg/dL (ref 0.61–1.24)
GFR calc Af Amer: >60 mL/min (ref >60)
GFR calc non Af Amer: >60 mL/min (ref >60)
GLUCOSE: 155 mg/dL — AB (ref 65–99)
POTASSIUM: 3.5 mmol/L (ref 3.5–5.1)
Sodium: 141 mmol/L (ref 135–145)
TOTAL PROTEIN: 7.4 g/dL (ref 6.5–8.1)

## 2016-11-20 LAB — HEMOGLOBIN A1C
Hgb A1c MFr Bld: 6.2 % — ABNORMAL HIGH (ref 4.8–5.6)
MEAN PLASMA GLUCOSE: 131.24 mg/dL

## 2016-11-20 LAB — CBC WITH DIFFERENTIAL/PLATELET
Basophils Absolute: 0 10*3/uL (ref 0.0–0.1)
Basophils Relative: 0 %
EOS PCT: 1 %
Eosinophils Absolute: 0.1 10*3/uL (ref 0.0–0.7)
HCT: 40.9 % (ref 39.0–52.0)
Hemoglobin: 14 g/dL (ref 13.0–17.0)
LYMPHS ABS: 2.3 10*3/uL (ref 0.7–4.0)
LYMPHS PCT: 20 %
MCH: 29.6 pg (ref 26.0–34.0)
MCHC: 34.2 g/dL (ref 30.0–36.0)
MCV: 86.5 fL (ref 78.0–100.0)
MONO ABS: 1.9 10*3/uL — AB (ref 0.1–1.0)
MONOS PCT: 16 %
Neutro Abs: 7.1 10*3/uL (ref 1.7–7.7)
Neutrophils Relative %: 63 %
PLATELETS: 161 10*3/uL (ref 150–400)
RBC: 4.73 MIL/uL (ref 4.22–5.81)
RDW: 13.6 % (ref 11.5–15.5)
WBC: 11.4 10*3/uL — ABNORMAL HIGH (ref 4.0–10.5)

## 2016-11-20 LAB — URINALYSIS, ROUTINE W REFLEX MICROSCOPIC
BILIRUBIN URINE: NEGATIVE
GLUCOSE, UA: 50 mg/dL — AB
KETONES UR: 80 mg/dL — AB
Nitrite: POSITIVE — AB
PH: 5 (ref 5.0–8.0)
Protein, ur: 100 mg/dL — AB
Specific Gravity, Urine: 1.028 (ref 1.005–1.030)

## 2016-11-20 LAB — BASIC METABOLIC PANEL
Anion gap: 15 (ref 5–15)
BUN: 12 mg/dL (ref 6–20)
CHLORIDE: 103 mmol/L (ref 101–111)
CO2: 24 mmol/L (ref 22–32)
CREATININE: 0.92 mg/dL (ref 0.61–1.24)
Calcium: 9.5 mg/dL (ref 8.9–10.3)
GFR calc Af Amer: >60 mL/min (ref >60)
GFR calc non Af Amer: >60 mL/min (ref >60)
GLUCOSE: 155 mg/dL — AB (ref 65–99)
POTASSIUM: 3.4 mmol/L — AB (ref 3.5–5.1)
SODIUM: 142 mmol/L (ref 135–145)

## 2016-11-20 LAB — CBC
HCT: 42.1 % (ref 39.0–52.0)
HEMOGLOBIN: 14.4 g/dL (ref 13.0–17.0)
MCH: 29.6 pg (ref 26.0–34.0)
MCHC: 34.2 g/dL (ref 30.0–36.0)
MCV: 86.6 fL (ref 78.0–100.0)
PLATELETS: 170 10*3/uL (ref 150–400)
RBC: 4.86 MIL/uL (ref 4.22–5.81)
RDW: 13.4 % (ref 11.5–15.5)
WBC: 10.8 10*3/uL — AB (ref 4.0–10.5)

## 2016-11-20 LAB — GLUCOSE, CAPILLARY
GLUCOSE-CAPILLARY: 122 mg/dL — AB (ref 65–99)
Glucose-Capillary: 162 mg/dL — ABNORMAL HIGH (ref 65–99)
Glucose-Capillary: 143 mg/dL — ABNORMAL HIGH (ref 65–99)
Glucose-Capillary: 183 mg/dL — ABNORMAL HIGH (ref 65–99)

## 2016-11-20 LAB — LACTIC ACID, PLASMA: Lactic Acid, Venous: 1.5 mmol/L (ref 0.5–1.9)

## 2016-11-20 LAB — CK: Total CK: 385 U/L (ref 49–397)

## 2016-11-20 LAB — I-STAT CG4 LACTIC ACID, ED: Lactic Acid, Venous: 2.43 mmol/L (ref 0.5–1.9)

## 2016-11-20 LAB — TSH: TSH: 2.528 u[IU]/mL (ref 0.350–4.500)

## 2016-11-20 LAB — I-STAT TROPONIN, ED: TROPONIN I, POC: 0.01 ng/mL (ref 0.00–0.08)

## 2016-11-20 LAB — TROPONIN I: Troponin I: <0.03 ng/mL (ref <0.03)

## 2016-11-20 MED ORDER — INSULIN ASPART 100 UNIT/ML ~~LOC~~ SOLN
0.0000 [IU] | Freq: Three times a day (TID) | SUBCUTANEOUS | Status: DC
  Administered 2016-11-20: 2 [IU] via SUBCUTANEOUS
  Administered 2016-11-20: 1 [IU] via SUBCUTANEOUS
  Administered 2016-11-20 – 2016-11-21 (×2): 2 [IU] via SUBCUTANEOUS
  Administered 2016-11-22 – 2016-11-23 (×2): 1 [IU] via SUBCUTANEOUS

## 2016-11-20 MED ORDER — ACETAMINOPHEN 650 MG RE SUPP: 650.0000 mg | Freq: Four times a day (QID) | RECTAL | Status: DC | PRN

## 2016-11-20 MED ORDER — PIPERACILLIN-TAZOBACTAM 3.375 G IVPB
3.3750 g | Freq: Three times a day (TID) | INTRAVENOUS | Status: DC
  Administered 2016-11-20 – 2016-11-21 (×3): 3.375 g via INTRAVENOUS
  Filled 2016-11-20 (×5): qty 50

## 2016-11-20 MED ORDER — PIPERACILLIN-TAZOBACTAM 3.375 G IVPB 30 MIN
3.3750 g | Freq: Once | INTRAVENOUS | Status: AC
  Administered 2016-11-20: 3.375 g via INTRAVENOUS
  Filled 2016-11-20: qty 50

## 2016-11-20 MED ORDER — TAMSULOSIN HCL 0.4 MG PO CAPS
0.4000 mg | ORAL_CAPSULE | Freq: Every day | ORAL | Status: DC
  Administered 2016-11-20 – 2016-11-23 (×4): 0.4 mg via ORAL
  Filled 2016-11-20 (×4): qty 1

## 2016-11-20 MED ORDER — INSULIN DETEMIR 100 UNIT/ML ~~LOC~~ SOLN
60.0000 [IU] | Freq: Every day | SUBCUTANEOUS | Status: DC
  Administered 2016-11-20 – 2016-11-23 (×4): 60 [IU] via SUBCUTANEOUS
  Filled 2016-11-20 (×5): qty 0.6

## 2016-11-20 MED ORDER — SODIUM CHLORIDE 0.9 % IV BOLUS (SEPSIS)
1000.0000 mL | Freq: Once | INTRAVENOUS | Status: AC
  Administered 2016-11-20: 1000 mL via INTRAVENOUS

## 2016-11-20 MED ORDER — METOPROLOL TARTRATE 25 MG PO TABS
100.0000 mg | ORAL_TABLET | Freq: Two times a day (BID) | ORAL | Status: DC
  Administered 2016-11-20 – 2016-11-23 (×7): 100 mg via ORAL
  Filled 2016-11-20 (×4): qty 4
  Filled 2016-11-20: qty 8
  Filled 2016-11-20 (×2): qty 4

## 2016-11-20 MED ORDER — ATORVASTATIN CALCIUM 40 MG PO TABS
40.0000 mg | ORAL_TABLET | Freq: Every day | ORAL | Status: DC
Start: 2016-11-20 — End: 2016-11-23
  Administered 2016-11-20 – 2016-11-22 (×3): 40 mg via ORAL
  Filled 2016-11-20 (×3): qty 1

## 2016-11-20 MED ORDER — INSULIN ASPART 100 UNIT/ML ~~LOC~~ SOLN
10.0000 [IU] | Freq: Two times a day (BID) | SUBCUTANEOUS | Status: DC
  Administered 2016-11-20 – 2016-11-23 (×7): 10 [IU] via SUBCUTANEOUS

## 2016-11-20 MED ORDER — CALCIUM CARBONATE-VITAMIN D 500-200 MG-UNIT PO TABS
1.0000 | ORAL_TABLET | Freq: Every day | ORAL | Status: DC
  Administered 2016-11-20 – 2016-11-23 (×4): 1 via ORAL
  Filled 2016-11-20 (×4): qty 1

## 2016-11-20 MED ORDER — ASPIRIN 325 MG PO TABS
325.0000 mg | ORAL_TABLET | Freq: Every day | ORAL | Status: DC
  Administered 2016-11-20 – 2016-11-23 (×4): 325 mg via ORAL
  Filled 2016-11-20 (×4): qty 1

## 2016-11-20 MED ORDER — ONDANSETRON HCL 4 MG/2ML IJ SOLN: 4.0000 mg | Freq: Four times a day (QID) | INTRAMUSCULAR | Status: DC | PRN

## 2016-11-20 MED ORDER — ACETAMINOPHEN 325 MG PO TABS: 650.0000 mg | ORAL_TABLET | Freq: Four times a day (QID) | ORAL | Status: DC | PRN

## 2016-11-20 MED ORDER — FUROSEMIDE 40 MG PO TABS
40.0000 mg | ORAL_TABLET | Freq: Two times a day (BID) | ORAL | Status: DC
  Administered 2016-11-20 – 2016-11-21 (×2): 40 mg via ORAL
  Filled 2016-11-20 (×2): qty 1

## 2016-11-20 MED ORDER — ADULT MULTIVITAMIN W/MINERALS CH
1.0000 | ORAL_TABLET | Freq: Every day | ORAL | Status: DC
  Administered 2016-11-20 – 2016-11-23 (×4): 1 via ORAL
  Filled 2016-11-20 (×4): qty 1

## 2016-11-20 MED ORDER — ENOXAPARIN SODIUM 100 MG/ML ~~LOC~~ SOLN
90.0000 mg | SUBCUTANEOUS | Status: DC
  Administered 2016-11-20 – 2016-11-21 (×2): 90 mg via SUBCUTANEOUS
  Filled 2016-11-20 (×2): qty 1

## 2016-11-20 MED ORDER — VANCOMYCIN HCL 10 G IV SOLR
2500.0000 mg | Freq: Once | INTRAVENOUS | Status: AC
  Administered 2016-11-20: 2500 mg via INTRAVENOUS
  Filled 2016-11-20: qty 2500

## 2016-11-20 MED ORDER — POTASSIUM CHLORIDE CRYS ER 20 MEQ PO TBCR
40.0000 meq | EXTENDED_RELEASE_TABLET | Freq: Once | ORAL | Status: AC
  Administered 2016-11-20: 40 meq via ORAL
  Filled 2016-11-20: qty 2

## 2016-11-20 MED ORDER — ONDANSETRON HCL 4 MG PO TABS: 4.0000 mg | ORAL_TABLET | Freq: Four times a day (QID) | ORAL | Status: DC | PRN

## 2016-11-20 MED ORDER — VANCOMYCIN HCL 10 G IV SOLR
1250.0000 mg | Freq: Two times a day (BID) | INTRAVENOUS | Status: DC
  Administered 2016-11-20 – 2016-11-21 (×2): 1250 mg via INTRAVENOUS
  Filled 2016-11-20 (×3): qty 1250

## 2016-11-20 MED ORDER — LEVOTHYROXINE SODIUM 200 MCG PO TABS
200.0000 ug | ORAL_TABLET | Freq: Every day | ORAL | Status: DC
  Administered 2016-11-20 – 2016-11-23 (×4): 200 ug via ORAL
  Filled 2016-11-20 (×3): qty 1
  Filled 2016-11-20: qty 2
  Filled 2016-11-20: qty 1
  Filled 2016-11-20 (×3): qty 2

## 2016-11-20 NOTE — ED Notes (Signed)
No addl lab draw pt not in room.

## 2016-11-20 NOTE — Progress Notes (Signed)
Patients daughter Suszanne Conners called  screaming and yelling on the phone to this RN about the patient saying he's not competent to make his own decisions. And demanding that he go to rehab. This RN try to explain that the patient is oriented and is able to make decisions for himself and that the MD Rama would call the daughter. The daughter continued to be verbally aggressive, I informed the daughter that I was discontinuing the phone call and the MD would be in contact.  Dr. Rockne Menghini aware of phone call and information of interaction.  Sheliah Plane RN

## 2016-11-20 NOTE — Progress Notes (Addendum)
Progress Note    NATION CRADLE  EXB:[id number] DOB: 03/11/1941  DOA: 11/20/2016 PCP: [name], MD    Brief Narrative:   Chief complaint: Follow-up cellulitis/UTI status post fall  Medical records reviewed and are as summarized below:  Derek Bass is an 76 y.o. male the PMH of DM 2, hypertension, hyperlipidemia, morbid obesity, chronic lower extremity lymphedema and chronic diastolic CHF was admitted 11/20/16 for evaluation after a ground level fall. Upon initial evaluation in the ED, he was noted to have swelling and erythema of the left lower extremity. No fractures on radiographic evaluation. UA consistent with UTI.  Assessment/Plan:   Principal Problem:   LLE cellulitis in the setting of lymphedema of both lower extremities Serum lactate elevated and received a fluid bolus of 1 L in the ED. Lactate subsequently cleared. Afebrile. WBC mildly elevated. Tachycardic. Placed on empiric Zosyn and vancomycin.  LLE erythema present.  Active Problems:   Fall Likely needs home PT versus SNF. PT evaluation requested.    Venous stasis dermatitis Hemosiderin deposits present both legs. Massive swelling. Will apply [employer].    Diabetes type 2, uncontrolled ([place]) Hemoglobin A1c 10.9% on 03/18/16. Currently being managed with Levemir 60 units daily, NovoLog 10 units twice a day with meals, and SSI insulin sensitive scale. CBG 155. Monitor closely. Recheck hemoglobin A1c. Continue to hold Glucophage.    HTN (hypertension)/grade I diastolic dysfunction Suboptimally controlled. Lasix on hold. Continue metoprolol. Resume lasix and increase dose to 40 mg BID. 2 D echo done 03/18/16: EF 60-65%, grade I DD.    Hypothyroidism Continue Synthroid.    Morbid obesity with BMI of 50.0-59.9, adult (HCC) Body mass index is 55.94 kg/m.    BPH Continue Flomax.    Hyperlipidemia Continue Lipitor. Resume omega 3 fatty acids.    Hypokalemia Replete.   Family  Communication/Anticipated D/C date and plan/Code Status   DVT prophylaxis: Lovenox ordered. Code Status: Full Code.  Family Communication: Multiple family at the bedside. Disposition Plan: Home vs SNF depending on progress, PT recommendations   Medical Consultants:    None.   Anti-Infectives:    Vancomycin 11/19/16--->  Zosyn 11/19/16--->  Subjective:   Tearful, says he felt weak and slumped to floor of kitchen and was unable to stand. Denies chest pain, nausea, vomiting, diarrhea.  Objective:    Vitals:   11/20/16 0103 11/20/16 0239 11/20/16 0300 11/20/16 0615  BP: (!) 142/90 (!) 152/81 (!) 145/69 (!) 170/67  Pulse: (!) 102 94 98 (!) 121  Resp: 16 18 (!) 21 (!) 21  Temp:    98.4 F (36.9 C)  TempSrc:      SpO2: 99% 99% 99% 100%  Weight:    (!) 192.3 kg (424 lb)  Height:    6\' 1"  (1.854 m)    Intake/Output Summary (Last 24 hours) at 11/20/16 0731 Last data filed at 11/20/16 0600  Gross per 24 hour  Intake                0 ml  Output                0 ml  Net                0 ml   Filed Weights   11/19/16 2310 11/20/16 0615  Weight: (!) 184.6 kg (407 lb) (!) 192.3 kg (424 lb)    Exam: General: Morbidly obese. Cardiovascular: Heart sounds show a regular rate, and rhythm. No gallops  or rubs. No murmurs. No JVD. Lungs: Clear to auscultation bilaterally with good air movement. No rales, rhonchi or wheezes. Abdomen: Obese, nontender, nondistended with normal active bowel sounds. No masses. No hepatosplenomegaly. Neurological: Alert and oriented 3. Moves all extremities 4 with equal strength. Cranial nerves II through XII grossly intact. Skin: Warm and dry. Hemosiderin deposits/dark discoloration to BLE. Extremities: No clubbing or cyanosis. Massive LE edema. LLE erythema. Pedal pulses 2+. Psychiatric: Mood and affect are depressed/tearful. Insight and judgment are intact.   Data Reviewed:   I have personally reviewed following labs and imaging  studies:  Labs: Labs show the following: Sodium 141, potassium 3.5, chloride 104, bicarbonate 26, BUN 12, creatinine 0.7, glucose 155. AST 29, ALT 29, alkaline phosphatase 66, total bilirubin 1.8, protein 7.4, albumin 3.2. WBC 11.4, hemoglobin 14, hematocrit 40.9, platelets 161. Lactic acid 2.43 ---> 1.5  CK 385, troponin less than 0.03.  TSH 2.528.  Microbiology No results found for this or any previous visit (from the past 240 hour(s)).  Procedures and diagnostic studies:  11/20/16 Dg Chest 2 View: Similar exam to prior. Borderline cardiomegaly with vascular congestion. Peribronchial cuffing may be bronchial inflammation or pulmonary edema.   11/20/16 Dg Pelvis 1-2 Views: No evidence of pelvic fracture. Moderate osteoarthritis of both hips.    11/20/16 Ct Head Wo Contrast: No acute intracranial abnormality. Stable non contrast CT appearance of the brain, negative for age aside from probable ex vacuo related ventricular prominence.   11/20/16 Ct Pelvis Wo Contrast: No acute fracture identified about the pelvis.   11/20/16: Lower extremity venous duplex: Technically limited due to body habitus. No evidence of DVT in visualized veins.  Medications:   . aspirin  325 mg Oral Daily  . atorvastatin  40 mg Oral q1800  . calcium-vitamin D  1 tablet Oral Daily  . enoxaparin (LOVENOX) injection  90 mg Subcutaneous Q24H  . insulin aspart  0-9 Units Subcutaneous TID WC  . insulin aspart  10 Units Subcutaneous BID WC  . insulin detemir  60 Units Subcutaneous Daily  . levothyroxine  200 mcg Oral QAC breakfast  . metoprolol tartrate  100 mg Oral BID  . multivitamin with minerals  1 tablet Oral Daily  . tamsulosin  0.4 mg Oral Daily   Continuous Infusions: . piperacillin-tazobactam (ZOSYN)  IV    . vancomycin       LOS: 0 days   Clela Hagadorn  [facility] Pager [phone]. If unable to reach me by pager, please call my cell phone at [phone].  *Please refer to  [email or web address], password [email or web address] to get updated schedule on who will round on this patient, as hospitalists switch teams weekly. If 7PM-7AM, please contact night-coverage at [email or web address], password [email or web address] for any overnight needs.  11/20/2016, 7:31 AM

## 2016-11-20 NOTE — ED Notes (Signed)
IV team unable to get line on patient.  Dr. Thomasene Lot notified for ultrasound line.

## 2016-11-20 NOTE — ED Provider Notes (Signed)
[place] DEPT Provider Note   CSN: [id number] Arrival date & time: 11/19/16  2300     History   Chief Complaint Chief Complaint  Derek Bass presents with  . Fall    on the floor since Tuesday    HPI Derek Bass is a 76 y.o. male.  HPI  Derek Bass 76 year old male presenting with fall.Derek Bass weighs over 400 pounds, has history of hypertension hyperlipidemia lymphedema type 2 diabetes He is presenting today with family from fall. Derek Bass had a fall earlier today. Derek Bass reports he was trying to get out of his chair with his walker and he fell down to his knees.  Derek Bass's family member reports that he recently got out of rehabilitation 2 months ago.  Derek Bass inability unsteady at baseline with a walker.family was unable to get Derek Bass up after the fall and had to call the EMS. Derek Bass reports he feels weaker than usual.  Past Medical History:  Diagnosis Date  . Colon polyps 04/2010   tubular adenomas, rpt colonoscopy 3 years [facility] [place])  . Hepatitis 1961   when in service, thinks viral  . History of prostate cancer    tx seed implants in [place] now followed by Dr. Gaynelle Arabian with yearly PSAs  . HLD (hyperlipidemia)   . HTN (hypertension)   . Hypothyroid   . Infection due to Enterobacter species    Urosepsis 02/2016  . Lymphedema 05/10/2013   Lymphedema pump per [facility] Dr Delana Meyer   . Obesity   . T2DM (type 2 diabetes mellitus) ([place])    underwent DMSE remotely  . Vitamin D deficiency     Derek Bass Active Problem List   Diagnosis Date Noted  . Chronic venous stasis dermatitis of both lower extremities   . Noncompliance with medications   . Cardiomegaly 03/18/2016  . Sepsis secondary to UTI ([place]) 03/17/2016  . Advanced care planning/counseling discussion 01/08/2014  . Lumbago 01/08/2014  . Lymphedema of both lower extremities 05/10/2013  . Lipoma 09/19/2012  . Medicare annual wellness visit, subsequent 12/06/2011  . Morbid obesity with BMI of 50.0-59.9, adult ([place])   .  Hypothyroidism 09/17/2010  . Diabetes type 2, uncontrolled ([place]) 04/13/2010  . HLD (hyperlipidemia) 04/13/2010  . 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 HTN (hypertension) 04/13/2010  . Dental caries 04/13/2010    Past Surgical History:  Procedure Laterality Date  . COLONOSCOPY  2012   tubular adenoma mult polyps, int/ext hemorrhoids, diverticula, rpt 3 yeras  . goiter removed  2002   on calcium ever since  . INSERTION PROSTATE RADIATION SEED  2003  . [place]  . US ECHOCARDIOGRAPHY  03/2013   Mild LVH and diastolic dysfunction, mild LA dilation, EF 55-60%, could not measure PA pressure.       Home Medications    Prior to Admission medications   Medication Sig Start Date End Date Taking? Authorizing Provider  acetaminophen (TYLENOL) 650 MG CR tablet Take 650 mg by mouth every 8 (eight) hours as needed for pain.     [provider]  ammonium lactate (AMLACTIN) 12 % cream APPLY TOPICALLY AS NEEDED. 05/19/16   [name], MD  aspirin 325 MG tablet Take 325 mg by mouth daily.    [provider]  atorvastatin (LIPITOR) 40 MG tablet Take 1 tablet (40 mg total) by mouth daily at 6 PM. 04/22/16   [name], MD  calcium-vitamin D (OSCAL WITH D) 500-200 MG-UNIT per tablet Take 1 tablet by mouth daily.    [provider]  clotrimazole (LOTRIMIN) 1 % cream APPLY TOPICALLY TWICE A DAY TO INTERDIGITAL WEB SPACES 08/19/14   [name], MD  furosemide (LASIX) 20 MG tablet TAKE 1 TABLET BY MOUTH TWICE A DAY 05/10/16   [name], MD  glucose 4 GM chewable tablet Chew 1 tablet (4 g total) by mouth as needed for low blood sugar. 04/14/15   [name], MD  Insulin Detemir (LEVEMIR FLEXTOUCH) 100 UNIT/ML Pen Inject 55 Units into the skin daily with breakfast. 04/27/16   [name], MD  insulin regular (NOVOLIN R) 250 units/2.44mL (100 units/mL) injection Inject 0.1 mLs (10 Units total) into the skin 2 (two)  times daily before a meal. 04/22/16   [name], MD  LEVEMIR FLEXTOUCH 100 UNIT/ML Pen INJECT 60 UNITS INTO THE SKIN DAILY WITH BREAKFAST. 06/22/16   [name], MD  levothyroxine (SYNTHROID, LEVOTHROID) 200 MCG tablet TAKE 1 TABLET BY MOUTH EVERY MORNING AT 6AM 05/10/16   [name], MD  loperamide (IMODIUM A-D) 2 MG tablet Take 2 mg by mouth as needed.    [provider]  metFORMIN (GLUCOPHAGE) 1000 MG tablet TAKE 1 TABLET BY MOUTH TWICE A DAY WITH MEALS 06/17/15   [name], MD  metoprolol (LOPRESSOR) 100 MG tablet TAKE 1 TABLET BY MOUTH TWICE A DAY 05/10/16   [name], MD  Multiple Vitamin (MULTIVITAMIN) capsule Take 1 capsule by mouth daily.    [provider]  NOVOLIN R 100 UNIT/ML injection INJECT 10 UNITS TOTAL INTO THE SKIN 2 TIMES DAILY, (BEFORE BREAKFAST AND BEFORE SUPPER) 05/28/16   [name], MD  Omega-3 Fatty Acids (OMEGA 3 PO) Take 2 capsules by mouth daily. Reported on 04/14/2015    [provider]  tamsulosin (FLOMAX) 0.4 MG CAPS capsule TAKE ONE CAPSULE BY MOUTH EVERY DAY 06/15/16   [name], MD  urea (CARMOL) 40 % CREA APPLY TO BOTTOMS OF HEELS EVERY DAY AS NEEDED FOR CRACKING HEELS 06/11/15   Wallene Huh, DPM    Family History Family History  Problem Relation Age of Onset  . Coronary artery disease Mother 101  . Prostate cancer Brother   . Diabetes Brother   . Stroke Neg Hx     Social History Social History  Substance Use Topics  . Smoking status: Former Smoker    Quit date: 01/27/1985  . Smokeless tobacco: Never Used  . Alcohol use No     Allergies   Derek Bass has no known allergies.   Review of Systems Review of Systems  Constitutional: Positive for fatigue. Negative for activity change.  Respiratory: Negative for shortness of breath.   Cardiovascular: Negative for chest pain.  Gastrointestinal: Negative for abdominal pain.  Neurological: Positive for weakness.     Physical  Exam Updated Vital Signs BP (!) 141/73   Pulse (!) 103   Temp 97.6 F (36.4 C) (Oral)   Resp (!) 27   Ht 6\' 2"  (1.88 m)   Wt (!) 184.6 kg (407 lb)   SpO2 100%   BMI 52.26 kg/m   Physical Exam  Constitutional: He is oriented to person, place, and time. He appears well-nourished.  Morbidly obese male.  HENT:  Head: Normocephalic.  Eyes: Conjunctivae are normal. Right eye exhibits no discharge. Left eye exhibits no discharge.  Cardiovascular: Normal rate and regular rhythm.   No murmur heard. Pulmonary/Chest: Effort normal. No respiratory distress. He has no wheezes.  Abdominal: Soft. Bowel sounds are normal. He exhibits no distension.  Musculoskeletal:  Chronic lymphedema  bilaterally. Left leg appears infected.  Some tenderness to the right groin, hip.  Neurological: He is oriented to person, place, and time. No cranial nerve deficit.  Skin: Skin is warm and dry. He is not diaphoretic.  Psychiatric: He has a normal mood and affect. His behavior is normal.       ED Treatments / Results  Labs (all labs ordered are listed, but only abnormal results are displayed) Labs Reviewed  BASIC METABOLIC PANEL - Abnormal; Notable for the following:       Result Value   Potassium 3.4 (*)    Glucose, Bld 155 (*)    All other components within normal limits  CBC - Abnormal; Notable for the following:    WBC 10.8 (*)    All other components within normal limits  CBG MONITORING, ED - Abnormal; Notable for the following:    Glucose-Capillary 155 (*)    All other components within normal limits  CK  URINALYSIS, ROUTINE W REFLEX MICROSCOPIC  I-STAT TROPONIN, ED  I-STAT CG4 LACTIC ACID, ED    EKG  EKG Interpretation  Date/Time:  Friday November 19 2016 23:11:01 EDT Ventricular Rate:  121 PR Interval:  146 QRS Duration: 146 QT Interval:  378 QTC Calculation: 536 R Axis:   -91 Text Interpretation:  Sinus tachycardia Right bundle branch block Abnormal ECG since last tracing no  significant change Confirmed by Malvin Johns [phone]) on 11/19/2016 11:16:56 PM Also confirmed by Thomasene Lot, Jeshawn Melucci [phone])  on 11/20/2016 12:57:47 AM       Radiology No results found.  Procedures Procedures (including critical care time)  Medications Ordered in ED Medications  sodium chloride 0.9 % bolus 1,000 mL (not administered)     Initial Impression / Assessment and Plan / ED Course  I have reviewed the triage vital signs and the nursing notes.  Pertinent labs & imaging results that were available during my care of the Derek Bass were reviewed by me and considered in my medical decision making (see chart for details).    Derek Bass 76 year old male presenting with fall.Derek Bass weighs over 400 pounds, has history of hypertension hyperlipidemia lymphedema type 2 diabetes He is presenting today with family from fall. Derek Bass had a fall earlier today. Derek Bass reports he was trying to get out of his chair with his walker and he fell down to his knees.  Derek Bass's family member reports that he recently got out of rehabilitation 2 months ago.  Derek Bass inability unsteady at baseline with a walker.family was unable to get Derek Bass up after the fall and had to call the EMS. Derek Bass reports he feels weaker than usual.  1:06 AM Derek Bass appears to have a cellulitis in the left lower extremity. Outpatient treatment has not been initiated but Derek Bass's poor baseline status and weakness in concerning.  Will admit for intiation of IV abx to make sure cellulitis is improving. Family reports that he's been too weak to walk and get around as usual.    Final Clinical Impressions(s) / ED Diagnoses   Final diagnoses:  None    New Prescriptions New Prescriptions   No medications on file     Macarthur Critchley, MD 11/21/16 [phone]

## 2016-11-20 NOTE — Progress Notes (Signed)
*  PRELIMINARY RESULTS* Vascular Ultrasound Lower extremity venous duplex has been completed.  Preliminary findings: technically limited due to body habitus. No evidence of DVT in visualized veins.  Landry Mellow, RDMS, RVT  11/20/2016, 10:52 AM

## 2016-11-20 NOTE — Progress Notes (Signed)
New Admission Note: Pt transferred from [facility] to room 2W07  Arrival Method: via stretcher Mental Orientation: alert and oriented x 4 Telemetry: Per order Assessment: Completed Skin: Intact IV: Left upper arm Pain: No pain Tubes: None Safety Measures: Safety Fall Prevention Plan has been discussed  Admission: To be completed 6 [place] Orientation: Patient has been orientated to the room, unit and staff.  Family: Daughter and Son at the bedside  Orders to be reviewed and implemented. Will continue to monitor the patient. Call light has been placed within reach and bed alarm has been activated.   Mady Gemma, BSN, RN-BC Phone: [phone]

## 2016-11-20 NOTE — ED Notes (Signed)
Attempted PIV x2.  Veins very flat.

## 2016-11-20 NOTE — Progress Notes (Signed)
Pharmacy Antibiotic Note  Derek Bass is a 76 y.o. male admitted on 11/20/2016 with cellulitis.  Pharmacy has been consulted for vancomycin and zosyn dosing. Patient afebrile and WBC elevated at 10.8. LA also elevated at 2.43.   SCr 0.92 for estimated nCrCl ~ 60-65 mL/min.   Plan: Vancomycin 2.5g IV x1, then 1250 mg IV q12hr Zosyn 3.375g IV q8hr Vancomycin trough at [facility] and as needed (goal 10-15 mcg/mL) Monitor renal function, clinical picture, and culture data F/u length of therapy and de-escalation   Height: 6\' 2"  (188 cm) Weight: (!) 407 lb (184.6 kg) IBW/kg (Calculated) : 82.2  Temp (24hrs), Avg:97.6 F (36.4 C), Min:97.6 F (36.4 C), Max:97.6 F (36.4 C)   Recent Labs Lab 11/19/16 2314 11/20/16 0136  WBC 10.8*  --   CREATININE 0.92  --   LATICACIDVEN  --  2.43*    Estimated Creatinine Clearance: 120.9 mL/min (by C-G formula based on SCr of 0.92 mg/dL).    No Known Allergies  Antimicrobials this admission: 8/25 Vanc >>  8/25 Zosyn >>   Microbiology results: pending  Argie Ramming, PharmD Clinical Pharmacist 11/20/16 2:34 AM

## 2016-11-20 NOTE — ED Notes (Signed)
Patient transported to X-ray 

## 2016-11-20 NOTE — H&P (Signed)
History and Physical    Derek Bass DGU:[id number] DOB: 07-11-1940 DOA: 11/20/2016  PCP: [name], MD  Patient coming from: Home.  Chief Complaint: Fall.  HPI: Derek Bass is a 76 y.o. male with history of diabetes mellitus type 2, hypertension, diastolic dysfunction, hyperlipidemia, morbid obesity, chronic lower extremity lymphedema was brought to the ER after patient was found to be on the floor by patient's daughter. Patient states that 3 days ago patient was trying to walk in his house when he lost balance and fell. Since then he has been having weakness. He also found increasing swelling in his lower extremities over the last few days. He is not sure if he had loss consciousness. Denies any chest pain shortness of breath nausea vomiting abdominal pain or diarrhea. Has pain in the right groin area since his fall.   ED Course: In the ER on exam patient has increasing swelling on the lower extremity is with erythema involving the left lower extremity. Patient also notices that his right lower extremity swelling has worsened. Pain on moving his right hip. X-ray of the pelvis was unremarkable. Chest x-ray shows possible congestion.Marland Kitchen UA is consistent with UTI. Patient was started on empiric antibiotics for UTI and cellulitis. Patient was tachycardic on admission and also lactate was mildly elevated.  Review of Systems: As per HPI, rest all negative.   Past Medical History:  Diagnosis Date  . Colon polyps 04/2010   tubular adenomas, rpt colonoscopy 3 years [facility] [place])  . Hepatitis 1961   when in service, thinks viral  . History of prostate cancer    tx seed implants in [place] now followed by Dr. Gaynelle Arabian with yearly PSAs  . HLD (hyperlipidemia)   . HTN (hypertension)   . Hypothyroid   . Infection due to Enterobacter species    Urosepsis 02/2016  . Lymphedema 05/10/2013   Lymphedema pump per [facility] Dr Delana Meyer   . Obesity   . T2DM (type 2 diabetes mellitus) ([place])    underwent DMSE remotely  . Vitamin D deficiency     Past Surgical History:  Procedure Laterality Date  . COLONOSCOPY  2012   tubular adenoma mult polyps, int/ext hemorrhoids, diverticula, rpt 3 yeras  . goiter removed  2002   on calcium ever since  . INSERTION PROSTATE RADIATION SEED  2003  . [facility]  . US ECHOCARDIOGRAPHY  03/2013   Mild LVH and diastolic dysfunction, mild LA dilation, EF 55-60%, could not measure PA pressure.     reports that he quit smoking about 31 years ago. He has never used smokeless tobacco. He reports that he does not drink alcohol or use drugs.  No Known Allergies  Family History  Problem Relation Age of Onset  . Coronary artery disease Mother 58  . Prostate cancer Brother   . Diabetes Brother   . Stroke Neg Hx     Prior to Admission medications   Medication Sig Start Date End Date Taking? Authorizing Provider  acetaminophen (TYLENOL) 650 MG CR tablet Take 650 mg by mouth every 8 (eight) hours as needed for pain.    Yes [provider]  ammonium lactate (AMLACTIN) 12 % cream APPLY TOPICALLY AS NEEDED. 05/19/16  Yes [name], MD  aspirin 325 MG tablet Take 325 mg by mouth daily.   Yes [provider]  atorvastatin (LIPITOR) 40 MG tablet Take 1 tablet (40 mg total) by mouth daily at 6 PM. 04/22/16  Yes [name], MD  calcium-vitamin D (OSCAL WITH D) 500-200 MG-UNIT per tablet Take 1 tablet by mouth daily.   Yes [provider]  clotrimazole (LOTRIMIN) 1 % cream APPLY TOPICALLY TWICE A DAY TO INTERDIGITAL WEB SPACES 08/19/14  Yes [name], MD  furosemide (LASIX) 20 MG tablet TAKE 1 TABLET BY MOUTH TWICE A DAY 05/10/16  Yes [name], MD  glucose 4 GM chewable tablet Chew 1 tablet (4 g total) by mouth as needed for low blood sugar. 04/14/15  Yes [name], MD  LEVEMIR FLEXTOUCH 100 UNIT/ML Pen INJECT 60 UNITS INTO THE SKIN DAILY WITH BREAKFAST.  06/22/16  Yes [name], MD  levothyroxine (SYNTHROID, LEVOTHROID) 200 MCG tablet TAKE 1 TABLET BY MOUTH EVERY MORNING AT 6AM 05/10/16  Yes [name], MD  loperamide (IMODIUM A-D) 2 MG tablet Take 2 mg by mouth as needed for diarrhea or loose stools.    Yes [provider]  metFORMIN (GLUCOPHAGE) 1000 MG tablet TAKE 1 TABLET BY MOUTH TWICE A DAY WITH MEALS 06/17/15  Yes [name], MD  metoprolol (LOPRESSOR) 100 MG tablet TAKE 1 TABLET BY MOUTH TWICE A DAY 05/10/16  Yes [name], MD  Multiple Vitamin (MULTIVITAMIN) capsule Take 1 capsule by mouth daily.   Yes [provider]  NOVOLIN R 100 UNIT/ML injection INJECT 10 UNITS TOTAL INTO THE SKIN 2 TIMES DAILY, (BEFORE BREAKFAST AND BEFORE SUPPER) 05/28/16  Yes [name], MD  Omega-3 Fatty Acids (OMEGA 3 PO) Take 2 capsules by mouth daily. Reported on 04/14/2015   Yes [provider]  tamsulosin (FLOMAX) 0.4 MG CAPS capsule TAKE ONE CAPSULE BY MOUTH EVERY DAY 06/15/16  Yes [name], MD  urea (CARMOL) 40 % CREA APPLY TO BOTTOMS OF HEELS EVERY DAY AS NEEDED FOR CRACKING HEELS 06/11/15  Yes Regal, Tamala Fothergill, DPM  Insulin Detemir (LEVEMIR FLEXTOUCH) 100 UNIT/ML Pen Inject 55 Units into the skin daily with breakfast. Patient not taking: Reported on 11/20/2016 04/27/16   [name], MD  insulin regular (NOVOLIN R) 250 units/2.73mL (100 units/mL) injection Inject 0.1 mLs (10 Units total) into the skin 2 (two) times daily before a meal. Patient not taking: Reported on 11/20/2016 04/22/16   [name], MD    Physical Exam: Vitals:   11/20/16 0031 11/20/16 0103 11/20/16 0239 11/20/16 0300  BP: (!) 141/73 (!) 142/90 (!) 152/81 (!) 145/69  Pulse: (!) 103 (!) 102 94 98  Resp: (!) 27 16 18  (!) 21  Temp:      TempSrc:      SpO2: 100% 99% 99% 99%  Weight:      Height:          Constitutional: Moderately built and nourished. Vitals:   11/20/16 0031 11/20/16 0103 11/20/16 0239  11/20/16 0300  BP: (!) 141/73 (!) 142/90 (!) 152/81 (!) 145/69  Pulse: (!) 103 (!) 102 94 98  Resp: (!) 27 16 18  (!) 21  Temp:      TempSrc:      SpO2: 100% 99% 99% 99%  Weight:      Height:       Eyes: Anicteric no pallor. ENMT: No discharge from the ears eyes nose a month. Neck: No mass felt. No JVD appreciated. Respiratory: No rhonchi or crepitations. Cardiovascular: S1 and S2 heard no murmurs appreciated. Abdomen: Soft nontender bowel sounds present. Musculoskeletal: Bilateral lower extremity edema with erythema of the left lower extremity. Pain on my right hip. Skin: Left lower extremity erythema extending up to  the thigh. Neurologic: Alert awake oriented to time place and person. Moves all extremities. Right lower extremity movement decreased by pain. Psychiatric: Appears normal.   Labs on Admission: I have personally reviewed following labs and imaging studies  CBC:  Recent Labs Lab 11/19/16 2314  WBC 10.8*  HGB 14.4  HCT 42.1  MCV 86.6  PLT 188   Basic Metabolic Panel:  Recent Labs Lab 11/19/16 2314  NA 142  K 3.4*  CL 103  CO2 24  GLUCOSE 155*  BUN 12  CREATININE 0.92  CALCIUM 9.5   GFR: Estimated Creatinine Clearance: 120.9 mL/min (by C-G formula based on SCr of 0.92 mg/dL). Liver Function Tests: No results for input(s): AST, ALT, ALKPHOS, BILITOT, PROT, ALBUMIN in the last 168 hours. No results for input(s): LIPASE, AMYLASE in the last 168 hours. No results for input(s): AMMONIA in the last 168 hours. Coagulation Profile: No results for input(s): INR, PROTIME in the last 168 hours. Cardiac Enzymes:  Recent Labs Lab 11/19/16 2314  CKTOTAL 385   BNP (last 3 results) No results for input(s): PROBNP in the last 8760 hours. HbA1C: No results for input(s): HGBA1C in the last 72 hours. CBG:  Recent Labs Lab 11/19/16 2310  GLUCAP 155*   Lipid Profile: No results for input(s): CHOL, HDL, LDLCALC, TRIG, CHOLHDL, LDLDIRECT in the last 72  hours. Thyroid Function Tests: No results for input(s): TSH, T4TOTAL, FREET4, T3FREE, THYROIDAB in the last 72 hours. Anemia Panel: No results for input(s): VITAMINB12, FOLATE, FERRITIN, TIBC, IRON, RETICCTPCT in the last 72 hours. Urine analysis:    Component Value Date/Time   COLORURINE AMBER (A) 11/20/2016 0245   APPEARANCEUR HAZY (A) 11/20/2016 0245   LABSPEC 1.028 11/20/2016 0245   PHURINE 5.0 11/20/2016 0245   GLUCOSEU 50 (A) 11/20/2016 0245   HGBUR SMALL (A) 11/20/2016 0245   HGBUR Neg 04/13/2010 1120   BILIRUBINUR NEGATIVE 11/20/2016 0245   BILIRUBINUR neg 09/19/2012 1125   KETONESUR 80 (A) 11/20/2016 0245   PROTEINUR 100 (A) 11/20/2016 0245   UROBILINOGEN 0.2 09/19/2012 1125   UROBILINOGEN 1.0 04/13/2010 1120   NITRITE POSITIVE (A) 11/20/2016 0245   LEUKOCYTESUR LARGE (A) 11/20/2016 0245   Sepsis Labs: @LABRCNTIP (procalcitonin:4,lacticidven:4) )No results found for this or any previous visit (from the past 240 hour(s)).   Radiological Exams on Admission: Dg Chest 2 View  Result Date: 11/20/2016 CLINICAL DATA:  Chest pain.  Fall. EXAM: CHEST  2 VIEW COMPARISON:  Radiographs 03/17/2016 FINDINGS: Borderline cardiomegaly. Vascular congestion. Diffuse peribronchial cuffing. No confluent airspace disease. No pneumothorax or pleural effusion. No evidence of acute osseous abnormality. Soft tissue attenuation from large body habitus limits assessment. IMPRESSION: Similar exam to prior. Borderline cardiomegaly with vascular congestion. Peribronchial cuffing may be bronchial inflammation or pulmonary edema. Electronically Signed   By: Jeb Levering M.D.   On: 11/20/2016 02:08   Dg Pelvis 1-2 Views  Result Date: 11/20/2016 CLINICAL DATA:  Fall. EXAM: PELVIS - 1-2 VIEW COMPARISON:  None. FINDINGS: The cortical margins of the bony pelvis are intact. No fracture. Pubic symphysis and sacroiliac joints are congruent. Both femoral heads are well-seated in the respective acetabula.  Moderate osteoarthritis of both hips. Brachytherapy seeds in the region the prostate bed. Soft tissue attenuation from large body habitus limits assessment. IMPRESSION: No evidence of pelvic fracture. Moderate osteoarthritis of both hips. Electronically Signed   By: Jeb Levering M.D.   On: 11/20/2016 02:09    EKG: Independently reviewed. Sinus tachycardia with RBBB.  Assessment/Plan Principal Problem:  Cellulitis of both lower extremities Active Problems:   Diabetes type 2, uncontrolled (HCC)   HTN (hypertension)   Hypothyroidism   Morbid obesity with BMI of 50.0-59.9, adult (HCC)   Lymphedema of both lower extremities   Cellulitis    1. Cellulitis of the both lower extremities with UTI  - patient has been placed on empiric antibiotics. Follow cultures. Patient did receive fluid bolus in the ER 1 L. Since patient has diastolic dysfunction will hold off further fluids but hold Lasix for now and recheck lactate. We'll check Dopplers to rule out DVT. 2. Weakness and fall - could be from deconditioning or infection. Closely monitor in telemetry for any arrhythmias. Physical therapy consult. 3. Hypertension on metoprolol. 4. Diabetes mellitus type 2 on Levemir and NovoLog. Will hold metformin due to elevated lactate levels. Sliding scale coverage. 5. Hypothyroidism on Synthroid. 6. History of prostate cancer status post seeding. 7. Hyperlipidemia on statins.  Since patient has significant pain on moving her right hip I have ordered CT pelvis which is pending. Since patient also had an unwitnessed fall CT head is pending.  I have reviewed patient's old charts and labs.   DVT prophylaxis: Lovenox. Code Status: Full code.  Family Communication: Discussed with patient's daughter.  Disposition Plan: To be determined.  Consults called: Physical therapy.  Admission status: Inpatient.    Rise Patience MD [facility] Pager [phone].  If 7PM-7AM, please contact  night-coverage [email or web address] Password [facility]  11/20/2016, 4:56 AM

## 2016-11-20 NOTE — Evaluation (Signed)
Physical Therapy Evaluation Patient Details Name: Derek Bass MRN: [id number] DOB: [birth year] Today's Date: 11/20/2016   History of Present Illness  Patient is a 76 yo male admitted 11/20/16 after a fall.  Patient iwth cellulitis of BLE's, UTI, and RLE pain.    PMH:  Morbid obesity, DM, HTN, CHF, HLD, chronic lymphedema LE's  Clinical Impression  Patient presents with problems listed below.  Will benefit from acute PT to maximize functional mobility prior to discharge.  Patient with pain in RLE/groin, and weakness impacting mobility.  Patient declined to sit EOB with PT.  Patient home alone at times - family available prn.  At this point would recommend ST-SNF for continued therapy prior to return home alone.  Patient will need to function at Mod I level to be at home alone safely.    Follow Up Recommendations SNF;Supervision for mobility/OOB    Equipment Recommendations  None recommended by PT    Recommendations for Other Services       Precautions / Restrictions Precautions Precautions: Fall Restrictions Weight Bearing Restrictions: No      Mobility  Bed Mobility Overal bed mobility: Needs Assistance Bed Mobility: Rolling Rolling: Mod assist         General bed mobility comments: Verbal cues for technique.  Assist to bring upper trunk forward to reach rail.  Assist to bring hips forward to roll.  PT able to place bedpan.  Transfers                 General transfer comment: Patient declined sitting EOB and reported he needs bedpan.  Provided.  Ambulation/Gait                Stairs            Wheelchair Mobility    Modified Rankin (Stroke Patients Only)       Balance                                             Pertinent Vitals/Pain Pain Assessment: 0-10 Pain Score: 6  Pain Location: Rt groin and Rt leg Pain Descriptors / Indicators: Aching;Sore Pain Intervention(s): Limited activity within patient's  tolerance;Monitored during session;Repositioned    Home Living Family/patient expects to be discharged to:: Unsure Living Arrangements: Alone Available Help at Discharge: Family;Available PRN/intermittently Type of Home: House Home Access: Level entry     Home Layout: Two level;Able to live on main level with bedroom/bathroom Home Equipment: Kasandra Knudsen - single point;Bedside commode;Wheelchair - [employer] - 2 wheels      Prior Function Level of Independence: Independent with assistive device(s);Needs assistance   Gait / Transfers Assistance Needed: Patient uses RW for very short distances.  Otherwise uses w/c.  ADL's / Homemaking Assistance Needed: Family assists with meal prep, housekeeping, laundry, bathing        Hand Dominance   Dominant Hand: Right    Extremity/Trunk Assessment   Upper Extremity Assessment Upper Extremity Assessment: Generalized weakness    Lower Extremity Assessment Lower Extremity Assessment: RLE deficits/detail;LLE deficits/detail RLE Deficits / Details: Strength grossly 2/5 with pain present.  Edema lower leg. RLE: Unable to fully assess due to pain RLE Coordination: decreased gross motor LLE Deficits / Details: Strength grossly 3-/5.  Edema lower leg LLE Coordination: decreased gross motor       Communication   Communication: No difficulties  Cognition Arousal/Alertness: Awake/alert Behavior  During Therapy: Flat affect Overall Cognitive Status: Within Functional Limits for tasks assessed                                        General Comments      Exercises     Assessment/Plan    PT Assessment Patient needs continued PT services  PT Problem List Decreased strength;Decreased activity tolerance;Decreased balance;Decreased mobility;Decreased coordination;Decreased knowledge of use of DME;Obesity;Pain       PT Treatment Interventions DME instruction;Gait training;Functional mobility training;Therapeutic  activities;Therapeutic exercise;Balance training;Patient/family education    PT Goals (Current goals can be found in the Care Plan section)  Acute Rehab PT Goals Patient Stated Goal: None stated PT Goal Formulation: With patient Time For Goal Achievement: 11/27/16 Potential to Achieve Goals: Fair    Frequency Min 3X/week   Barriers to discharge Decreased caregiver support Family available prn    Co-evaluation               AM-PAC PT "6 Clicks" Daily Activity  Outcome Measure Difficulty turning over in bed (including adjusting bedclothes, sheets and blankets)?: Unable Difficulty moving from lying on back to sitting on the side of the bed? : Unable Difficulty sitting down on and standing up from a chair with arms (e.g., wheelchair, bedside commode, etc,.)?: Unable Help needed moving to and from a bed to chair (including a wheelchair)?: A Lot Help needed walking in hospital room?: A Lot Help needed climbing 3-5 steps with a railing? : Total 6 Click Score: 8    End of Session   Activity Tolerance: Patient limited by pain;Patient limited by fatigue Patient left: in bed;with call bell/phone within reach Nurse Communication: Mobility status (Patient on bedpan) PT Visit Diagnosis: Muscle weakness (generalized) (M62.81);History of falling (Z91.81);Difficulty in walking, not elsewhere classified (R26.2);Pain Pain - Right/Left: Right Pain - part of body: Leg    Time: 9937-1696 PT Time Calculation (min) (ACUTE ONLY): 13 min   Charges:   PT Evaluation $PT Eval Moderate Complexity: 1 Mod     PT G Codes:        Carita Pian. Sanjuana Kava, [facility] Acute Rehab Services Pager [phone]   Despina Pole 11/20/2016, 5:09 PM

## 2016-11-20 NOTE — ED Notes (Signed)
Patient's family member yelling at this RN in the lobby, security paged.  Agricultural consultant on phone in lobby dealing with another situation, unable to speak to patient at time of request.  Patient now yelling at security and other staff members.

## 2016-11-21 DIAGNOSIS — N179 Acute kidney failure, unspecified: Secondary | ICD-10-CM | POA: Diagnosis not present

## 2016-11-21 LAB — GLUCOSE, CAPILLARY
GLUCOSE-CAPILLARY: 119 mg/dL — AB (ref 65–99)
GLUCOSE-CAPILLARY: 121 mg/dL — AB (ref 65–99)
GLUCOSE-CAPILLARY: 168 mg/dL — AB (ref 65–99)
Glucose-Capillary: 108 mg/dL — ABNORMAL HIGH (ref 65–99)

## 2016-11-21 LAB — BASIC METABOLIC PANEL
ANION GAP: 6 (ref 5–15)
BUN: 12 mg/dL (ref 6–20)
CHLORIDE: 107 mmol/L (ref 101–111)
CO2: 27 mmol/L (ref 22–32)
Calcium: 8.6 mg/dL — ABNORMAL LOW (ref 8.9–10.3)
Creatinine, Ser: 1.32 mg/dL — ABNORMAL HIGH (ref 0.61–1.24)
GFR, EST AFRICAN AMERICAN: 59 mL/min — AB (ref >60)
GFR, EST NON AFRICAN AMERICAN: 51 mL/min — AB (ref >60)
Glucose, Bld: 132 mg/dL — ABNORMAL HIGH (ref 65–99)
POTASSIUM: 3.5 mmol/L (ref 3.5–5.1)
SODIUM: 140 mmol/L (ref 135–145)

## 2016-11-21 MED ORDER — FUROSEMIDE 20 MG PO TABS
20.0000 mg | ORAL_TABLET | Freq: Two times a day (BID) | ORAL | Status: DC
  Administered 2016-11-22 – 2016-11-23 (×3): 20 mg via ORAL
  Filled 2016-11-21 (×3): qty 1

## 2016-11-21 MED ORDER — POTASSIUM CHLORIDE CRYS ER 20 MEQ PO TBCR
20.0000 meq | EXTENDED_RELEASE_TABLET | Freq: Two times a day (BID) | ORAL | Status: DC
  Administered 2016-11-21 – 2016-11-23 (×5): 20 meq via ORAL
  Filled 2016-11-21 (×4): qty 1

## 2016-11-21 MED ORDER — DEXTROSE 5 % IV SOLN
2.0000 g | INTRAVENOUS | Status: DC
  Administered 2016-11-21: 2 g via INTRAVENOUS
  Filled 2016-11-21 (×2): qty 2

## 2016-11-21 MED ORDER — BISACODYL 10 MG RE SUPP: 10.0000 mg | Freq: Every day | RECTAL | Status: DC | PRN

## 2016-11-21 MED ORDER — POLYETHYLENE GLYCOL 3350 17 G PO PACK
17.0000 g | PACK | Freq: Every day | ORAL | Status: DC
  Administered 2016-11-21 – 2016-11-22 (×2): 17 g via ORAL
  Filled 2016-11-21 (×3): qty 1

## 2016-11-21 NOTE — Progress Notes (Signed)
Pharmacy Antibiotic Note  Derek Bass is a 76 y.o. male admitted on 11/20/2016 with cellulitis and UTI.  Pharmacy has been consulted for vancomycin and Zosyn dosing.  SCr 0.87>1.32 today. Normalized CrCl ~71mL/min.  Spoke with Dr. Rockne Menghini- cellulitis is non-purulent and MRSA coverage likely not needed. Urine with >100K GNR, not yet speciated.  Will narrow to ceftriaxone only and re-broaden coverage if clinical picture dictates that need.  Plan: Stop vancomycin and Zosyn Ceftriaxone 2g IV q24h Pharmacy to follow peripherally  Height: 6\' 2"  (188 cm) Weight: (!) 425 lb (192.8 kg) IBW/kg (Calculated) : 82.2  Temp (24hrs), Avg:98.1 F (36.7 C), Min:97.9 F (36.6 C), Max:98.4 F (36.9 C)   Recent Labs Lab 11/19/16 2314 11/20/16 0136 11/20/16 0538 11/21/16 0742  WBC 10.8*  --  11.4*  --   CREATININE 0.92  --  0.87 1.32*  LATICACIDVEN  --  2.43* 1.5  --     Estimated Creatinine Clearance: 86.4 mL/min (A) (by C-G formula based on SCr of 1.32 mg/dL (H)).    No Known Allergies  Vanc 8/25>>8/26 Zosyn 8/25>>8/26 CTX 8/26>>   8/25 urine: >100K GNR  Thank you for allowing pharmacy to be a part of this patient's care.  Faithann Natal D. Thomasine Klutts, PharmD, BCPS Clinical Pharmacist Pager: [phone] Clinical Phone for 11/21/2016 until 3:30pm: [phone] If after 3:30pm, please call main pharmacy at [phone] 11/21/2016 1:49 PM

## 2016-11-21 NOTE — Progress Notes (Signed)
Progress Note    Derek Bass  RSW:[id number] DOB: 01-17-1941  DOA: 11/20/2016 PCP: Derek Bush, MD    Brief Narrative:   Chief complaint: Follow-up cellulitis/UTI status post fall  Medical records reviewed and are as summarized below:  Derek Bass is an 76 y.o. male the PMH of DM 2, hypertension, hyperlipidemia, morbid obesity, chronic lower extremity lymphedema and chronic diastolic CHF was admitted 11/20/16 for evaluation after a ground level fall. Upon initial evaluation in the ED, he was noted to have swelling and erythema of the left lower extremity. No fractures on radiographic evaluation. UA consistent with UTI.  Assessment/Plan:   Principal Problem:   LLE cellulitis in the setting of lymphedema of both lower extremities Serum lactate elevated on admission and received a fluid bolus of 1 L in the ED. Lactate subsequently cleared. Afebrile. WBC mildly elevated. Tachycardic. Continue empiric Zosyn and vancomycin.  Recheck creatinine in the morning given slight rise today.   Active Problems:   Acute kidney injury Bump in creatinine noted. Decrease Lasix. Monitor closely given risk of renal toxicity on vancomycin.    Fall Evaluated by PT 11/20/16: SNF recommended. Will consult social worker for placement.    Venous stasis dermatitis Hemosiderin deposits present both legs. Massive swelling. Unna Boots placed.    Diabetes type 2, uncontrolled (HCC) Hemoglobin A1c 6.2% indicating good outpatient control. Currently being managed with Levemir 60 units daily, NovoLog 10 units twice a day with meals, and SSI insulin sensitive scale. CBG 122-183. Continue close monitoring. Continue to hold Glucophage.    HTN (hypertension)/grade I diastolic dysfunction Suboptimally controlled. Continue metoprolol and decrease Lasix given rise in creatinine. 2 D echo done 03/18/16: EF 60-65%, grade I DD.    Hypothyroidism Continue Synthroid.    Morbid obesity with BMI of  50.0-59.9, adult (HCC) Body mass index is 55.94 kg/m.    BPH Continue Flomax.    Hyperlipidemia Continue Lipitor and omega 3 fatty acids.    Hypokalemia Repleted.   Family Communication/Anticipated D/C date and plan/Code Status   DVT prophylaxis: Lovenox ordered. Code Status: Full Code.  Family Communication: Multiple family at the bedside. Disposition Plan: SNF when medically stable and cellulitis improved. Patient indicates he does not want to go to SNF, and may need a competency evaluation if he continues to refuse as he is unsafe to return home in his present condition.   Medical Consultants:    None.   Anti-Infectives:    Vancomycin 11/19/16--->  Zosyn 11/19/16--->  Subjective:   Wheezing but doesn't feel dyspneic. Denies pain in his leg, and feels he can move it better. Appetite good. Last BM was several days ago, but doesn't feel constipated. Very resistant to SNF placement.  Seems a bit suspicious.  Objective:    Vitals:   11/20/16 1601 11/20/16 2154 11/20/16 2204 11/21/16 0613  BP: (!) 142/68  (!) 146/79 125/63  Pulse: 95 95 97 81  Resp: 18  18 20   Temp: 98 F (36.7 C)  97.9 F (36.6 C) 98.2 F (36.8 C)  TempSrc: Oral  Oral Oral  SpO2: 98%  98% 98%  Weight:   (!) 192.8 kg (425 lb)   Height:   6\' 2"  (1.88 m)     Intake/Output Summary (Last 24 hours) at 11/21/16 0717 Last data filed at 11/21/16 0615  Gross per 24 hour  Intake              460 ml  Output  1250 ml  Net             -790 ml   Filed Weights   11/19/16 2310 11/20/16 0615 11/20/16 2204  Weight: (!) 184.6 kg (407 lb) (!) 192.3 kg (424 lb) (!) 192.8 kg (425 lb)    Exam: General: No acute distress. Cardiovascular: Heart sounds show a regular rate, and rhythm. No gallops or rubs. No murmurs. No JVD. Lungs: Clear to auscultation bilaterally with good air movement. No rales, rhonchi or wheezes. Abdomen: Soft, nontender, nondistended with normal active bowel sounds. No  masses. No hepatosplenomegaly. Skin: Warm and dry. No rashes or lesions. Extremities: Unna boots in place.   Data Reviewed:   I have personally reviewed following labs and imaging studies:  Labs: Labs show the following: Sodium 140, potassium 3.5, chloride 107, bicarbonate 27, BUN 12, creatinine 1.32, glucose 132, calcium 8.6.  Lactic acid 2.43 ---> 1.5  CK 385, troponin less than 0.03.  TSH 2.528.  Microbiology No results found for this or any previous visit (from the past 240 hour(s)).  Procedures and diagnostic studies:  11/20/16 Dg Chest 2 View: Similar exam to prior. Borderline cardiomegaly with vascular congestion. Peribronchial cuffing may be bronchial inflammation or pulmonary edema.   11/20/16 Dg Pelvis 1-2 Views: No evidence of pelvic fracture. Moderate osteoarthritis of both hips.    11/20/16 Ct Head Wo Contrast: No acute intracranial abnormality. Stable non contrast CT appearance of the brain, negative for age aside from probable ex vacuo related ventricular prominence.   11/20/16 Ct Pelvis Wo Contrast: No acute fracture identified about the pelvis.   11/20/16: Lower extremity venous duplex: Technically limited due to body habitus. No evidence of DVT in visualized veins.  Medications:   . aspirin  325 mg Oral Daily  . atorvastatin  40 mg Oral q1800  . calcium-vitamin D  1 tablet Oral Daily  . enoxaparin (LOVENOX) injection  90 mg Subcutaneous Q24H  . furosemide  40 mg Oral BID  . insulin aspart  0-9 Units Subcutaneous TID WC  . insulin aspart  10 Units Subcutaneous BID WC  . insulin detemir  60 Units Subcutaneous Daily  . levothyroxine  200 mcg Oral QAC breakfast  . metoprolol tartrate  100 mg Oral BID  . multivitamin with minerals  1 tablet Oral Daily  . tamsulosin  0.4 mg Oral Daily   Continuous Infusions: . piperacillin-tazobactam (ZOSYN)  IV 3.375 g (11/21/16 0617)  . vancomycin 1,250 mg (11/21/16 0304)     LOS: 1 day   Derek Bass  [facility] Pager [phone]. If unable to reach me by pager, please call my cell phone at [phone].  *Please refer to [email or web address], password [email or web address] to get updated schedule on who will round on this patient, as hospitalists switch teams weekly. If 7PM-7AM, please contact night-coverage at [email or web address], password [email or web address] for any overnight needs.  11/21/2016, 7:17 AM

## 2016-11-22 DIAGNOSIS — F4320 Adjustment disorder, unspecified: Secondary | ICD-10-CM

## 2016-11-22 DIAGNOSIS — Z9114 Patient's other noncompliance with medication regimen: Secondary | ICD-10-CM

## 2016-11-22 DIAGNOSIS — Z87891 Personal history of nicotine dependence: Secondary | ICD-10-CM

## 2016-11-22 DIAGNOSIS — I872 Venous insufficiency (chronic) (peripheral): Secondary | ICD-10-CM

## 2016-11-22 LAB — BASIC METABOLIC PANEL
Anion gap: 9 (ref 5–15)
BUN: 12 mg/dL (ref 6–20)
CHLORIDE: 107 mmol/L (ref 101–111)
CO2: 27 mmol/L (ref 22–32)
Calcium: 9.1 mg/dL (ref 8.9–10.3)
Creatinine, Ser: 1.12 mg/dL (ref 0.61–1.24)
GFR calc Af Amer: >60 mL/min (ref >60)
GFR calc non Af Amer: >60 mL/min (ref >60)
GLUCOSE: 118 mg/dL — AB (ref 65–99)
POTASSIUM: 3.8 mmol/L (ref 3.5–5.1)
Sodium: 143 mmol/L (ref 135–145)

## 2016-11-22 LAB — URINE CULTURE

## 2016-11-22 LAB — GLUCOSE, CAPILLARY
GLUCOSE-CAPILLARY: 132 mg/dL — AB (ref 65–99)
GLUCOSE-CAPILLARY: 139 mg/dL — AB (ref 65–99)
GLUCOSE-CAPILLARY: 130 mg/dL — AB (ref 65–99)
Glucose-Capillary: 114 mg/dL — ABNORMAL HIGH (ref 65–99)

## 2016-11-22 MED ORDER — ENOXAPARIN SODIUM 100 MG/ML ~~LOC~~ SOLN
100.0000 mg | SUBCUTANEOUS | Status: DC
  Administered 2016-11-22: 100 mg via SUBCUTANEOUS
  Filled 2016-11-22: qty 1

## 2016-11-22 MED ORDER — SULFAMETHOXAZOLE-TRIMETHOPRIM 800-160 MG PO TABS
1.0000 | ORAL_TABLET | Freq: Two times a day (BID) | ORAL | Status: DC
  Administered 2016-11-22 – 2016-11-23 (×3): 1 via ORAL
  Filled 2016-11-22 (×3): qty 1

## 2016-11-22 NOTE — Progress Notes (Signed)
Physical Therapy Treatment Patient Details Name: Derek Bass MRN: [id number] DOB: December 08, 1940 Today's Date: 11/22/2016    History of Present Illness Patient is a 76 yo male admitted 11/20/16 after a fall.  Patient iwth cellulitis of BLE's, UTI, and RLE pain.    PMH:  Morbid obesity, DM, HTN, CHF, HLD, chronic lymphedema LE's    PT Comments    Continuing work on functional mobility and activity tolerance;  Noting very good progress with functional mobility and activity tolerance; Able to walk hallways with [name]; He tells me he Very much wants to go home, and given this good progress, that is not entirely unreasonable; SNF for post-acute rehab remains my best recommendation because he must be completely modified independent to manage at home -- and I anticipate he will refuse, in which case I would rec HHPT/OT   Follow Up Recommendations  SNF;Other (comment) (while SNF is still appropriate, he will likely refuse; in that case HHPT/OT/RN)     Equipment Recommendations  None recommended by PT    Recommendations for Other Services OT consult     Precautions / Restrictions Precautions Precautions: Fall    Mobility  Bed Mobility               General bed mobility comments: In chair upon arrival  Transfers Overall transfer level: Needs assistance Equipment used: Rolling walker (2 wheeled) Transfers: Sit to/from Stand Sit to Stand: Min assist         General transfer comment: Min assist to steady and weight shift anteriorly to transition hands from armrests to RW; dependent on UEs to push up and lift bottom off of the chair, and then he steps his feet backwards to get them under his center of mass  Ambulation/Gait Ambulation/Gait assistance: Min guard Ambulation Distance (Feet): 120 Feet Assistive device: Rolling walker (2 wheeled) Gait Pattern/deviations: Trunk flexed     General Gait Details: Cues to self-monitor for activity tolerance   Stairs             Wheelchair Mobility    Modified Rankin (Stroke Patients Only)       Balance Overall balance assessment: Needs assistance           Standing balance-Leahy Scale: Poor Standing balance comment: Reliant on UE support                            Cognition Arousal/Alertness: Awake/alert Behavior During Therapy: WFL for tasks assessed/performed Overall Cognitive Status: Within Functional Limits for tasks assessed                                        Exercises      General Comments        Pertinent Vitals/Pain Pain Assessment: Faces Faces Pain Scale: Hurts a little bit Pain Location: Rt groin and Rt leg,knee Pain Descriptors / Indicators: Aching;Sore Pain Intervention(s): Monitored during session    Home Living                      Prior Function            PT Goals (current goals can now be found in the care plan section) Acute Rehab PT Goals Patient Stated Goal: to go home PT Goal Formulation: With patient Time For Goal Achievement: 11/27/16 Potential to Achieve Goals: Fair Progress  towards PT goals: Goals met and updated - see care plan    Frequency    Min 3X/week      PT Plan Discharge plan needs to be updated    Co-evaluation              AM-PAC PT "6 Clicks" Daily Activity  Outcome Measure  Difficulty turning over in bed (including adjusting bedclothes, sheets and blankets)?: A Lot Difficulty moving from lying on back to sitting on the side of the bed? : A Lot Difficulty sitting down on and standing up from a chair with arms (e.g., wheelchair, bedside commode, etc,.)?: A Lot Help needed moving to and from a bed to chair (including a wheelchair)?: A Lot Help needed walking in hospital room?: A Little Help needed climbing 3-5 steps with a railing? : A Lot 6 Click Score: 13    End of Session Equipment Utilized During Treatment: Gait belt Activity Tolerance: Patient tolerated treatment  well Patient left: in chair;with call bell/phone within reach Nurse Communication: Mobility status PT Visit Diagnosis: Muscle weakness (generalized) (M62.81);History of falling (Z91.81);Difficulty in walking, not elsewhere classified (R26.2);Pain Pain - Right/Left: Right Pain - part of body: Leg     Time: 0518-3358 (Minus approx 5 minutes getting a bari RW) PT Time Calculation (min) (ACUTE ONLY): 32 min  Charges:  $Gait Training: 8-22 mins $Therapeutic Activity: 8-22 mins                    G Codes:       Roney Marion, PT  Acute Rehabilitation Services Pager [phone] Office Wall 11/22/2016, 1:29 PM

## 2016-11-22 NOTE — Care Management Note (Signed)
Case Management Note  Patient Details  Name: CREWS MCCOLLAM MRN: [id number] Date of Birth: [birth year]  Subjective/Objective:      CM following for progression and d/c planning.               Action/Plan: 11/22/2016 Noted pt improvement in mobility and ambulation. Pt requesting HHPT etc. Per pt he has had these services before and wishes to use the same [facility] agency.   Expected Discharge Date:                  Expected Discharge Plan:  [place]  In-House Referral:  NA  Discharge planning Services  CM Consult  Post Acute Care Choice:  Home Health Choice offered to:  Patient  DME Arranged:    DME Agency:     HH Arranged:  RN, PT, Nurse's Aide [place] Agency:     Status of Service:  In process, will continue to follow  If discussed at Long Length of Stay Meetings, dates discussed:    Additional Comments:  Adron Bene, RN 11/22/2016, 2:08 PM

## 2016-11-22 NOTE — Progress Notes (Signed)
CSW spoke with pt regarding PT recommendation for SNF.  Pt adamantly against going to SNF at this time- states he has been in the past and he is not willing to go back.  Pt states he is agreeable to home services and believes he has sufficient equipment at home except interested in another walker made for bariatric patients.    Patient has dtr who lives in same complex as him and assist as needed but pt is happiest when independent and does not like people around him all the time  CSW updated RNCM who will follow up with pt regarding home services  No further CSW needs at this time- CSW signing off  Jorge Ny, [place] Social Worker [phone]

## 2016-11-22 NOTE — Consult Note (Signed)
[facility] Face-to-Face Psychiatry Consult   Reason for Consult:  Capacity Referring Physician:  Dr. Rockne Menghini Patient Identification: AGOSTINO GORIN MRN:  [id number] Principal Diagnosis: Cellulitis of both lower extremities Diagnosis:   Patient Active Problem List   Diagnosis Date Noted  . AKI (acute kidney injury) ([place]) [N17.9] 11/21/2016  . Cellulitis of both lower extremities [L03.115, L03.116] 11/20/2016  . Cellulitis [L03.90] 11/20/2016  . Chronic venous stasis dermatitis of both lower extremities [I87.2]   . Noncompliance with medications [Z91.14]   . Cardiomegaly [I51.7] 03/18/2016  . Sepsis secondary to UTI ([place]) [A41.9, N39.0] 03/17/2016  . Advanced care planning/counseling discussion [Z71.89] 01/08/2014  . Lumbago [M54.5] 01/08/2014  . Lymphedema of both lower extremities [I89.0] 05/10/2013  . Lipoma [D17.9] 09/19/2012  . Medicare annual wellness visit, subsequent [Z00.00] 12/06/2011  . Morbid obesity with BMI of 50.0-59.9, adult ([place]) [E66.01, Z68.43]   . Hypothyroidism [E03.9] 09/17/2010  . Diabetes type 2, uncontrolled ([place]) [E11.65] 04/13/2010  . HLD (hyperlipidemia) [E78.5] 04/13/2010  . 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 HTN (hypertension) [I10] 04/13/2010  . Dental caries [K02.9] 04/13/2010    Total Time spent with patient: 1 hour  Subjective:   Derek Bass is a 76 y.o. male patient admitted with cellulitis of lower extremities.  HPI:  SCOT Derek Bass is a 76 y.o. male, seen, chart reviewed for this face-to-face psychiatric consultation and evaluation of capacity to make his own medical decisions and living arrangements. Patient reported that he has been struggling with his mobility since his leg was given up and has been crawling in his home for at least 2-3 days before he was brought into the emergency department by his family members. Patient has a life alert and also mobile phone which he  with he has access and calling his family members. Reported his  granddaughter came to the font of the house but she has no key with her and he took one and half hours before reaching the door and unlock it due to needs of crawling. His granddaughter left from the house front.   Patient has intact cognitions including orientation, concentration, memory and language functions. Patient is able to understand his chronic medical problems and needed treatment and he has been contact with his primary care physician for his medication management. Patient reported his daughter got of medical care power of attorney during the last hospitalization December, 2018 and trying to control his medications and his medical services which he did not like and does not want them to make those decisions. Patient reported a physical therapist came from an agency has been providing services which he does not like because he can do much more than a therapist can offer to him. His family is not happy about him denying [facility] physical therapist at that time. Patient stated he has been comfortable at his own home and he has a croupy his bathrooms to take showers and mobile with the rails and walker. Patient feels safe he goes to the rehabilitation center it may be more like discomfort to him down a comfort. Patient stated he does not mind if he takes more longer time to recover from in-home therapy services.   Past Psychiatric History: None reported  Risk to Self: Is patient at risk for suicide?: No Risk to Others:   Prior Inpatient Therapy:   Prior Outpatient Therapy:    Past Medical History:  Past Medical History:  Diagnosis Date  . Colon polyps 04/2010   tubular adenomas, rpt colonoscopy 3  years [facility])  . Hepatitis 1961   when in service, thinks viral  . History of prostate cancer    tx seed implants in [place] now followed by Dr. Gaynelle Arabian with yearly PSAs  . HLD (hyperlipidemia)   . HTN (hypertension)   . Hypothyroid   . Infection due to Enterobacter species    Urosepsis 02/2016   . Lymphedema 05/10/2013   Lymphedema pump per [facility] Dr Delana Meyer   . Obesity   . T2DM (type 2 diabetes mellitus) ([place])    underwent DMSE remotely  . Vitamin D deficiency     Past Surgical History:  Procedure Laterality Date  . COLONOSCOPY  2012   tubular adenoma mult polyps, int/ext hemorrhoids, diverticula, rpt 3 yeras  . goiter removed  2002   on calcium ever since  . INSERTION PROSTATE RADIATION SEED  2003  . [place]  . US ECHOCARDIOGRAPHY  03/2013   Mild LVH and diastolic dysfunction, mild LA dilation, EF 55-60%, could not measure PA pressure.   Family History:  Family History  Problem Relation Age of Onset  . Coronary artery disease Mother 35  . Prostate cancer Brother   . Diabetes Brother   . Stroke Neg Hx    Family Psychiatric  History: Unknown Social History:  History  Alcohol Use No     History  Drug Use No    Social History   Social History  . Marital status: Single    Spouse name: N/A  . Number of children: 2  . Years of education: HS   Occupational History  . retired Retired    previously was Librarian, academic at [employer]  . Smoking status: Former Smoker    Quit date: 01/27/1985  . Smokeless tobacco: Never Used  . Alcohol use No  . Drug use: No  . Sexual activity: Not Asked   Other Topics Concern  . None   Social History Narrative   Caffeine: 1 cup/day coffee, soda/lemonade with food   Lives alone. Family in area.   Edu: HS   Retired, previously Scientist, physiological.   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    2 biological children, 4 step children   Activity: no regular exercise   Diet: seldom water, fruits/vegetables seldom      Advanced directives - requests info on this. Has not thought about this.   Additional Social History:    Allergies:  No Known Allergies  Labs:  Results for orders  placed or performed during the hospital encounter of 11/20/16 (from the past 48 hour(s))  Glucose, capillary     Status: Abnormal   Collection Time: 11/20/16  4:05 PM  Result Value Ref Range   Glucose-Capillary 183 (H) 65 - 99 mg/dL  Glucose, capillary     Status: Abnormal   Collection Time: 11/20/16 10:02 PM  Result Value Ref Range   Glucose-Capillary 122 (H) 65 - 99 mg/dL  Basic metabolic panel     Status: Abnormal   Collection Time: 11/21/16  7:42 AM  Result Value Ref Range   Sodium 140 135 - 145 mmol/L   Potassium 3.5 3.5 - 5.1 mmol/L   Chloride 107 101 - 111 mmol/L   CO2 27 22 - 32 mmol/L   Glucose, Bld 132 (H) 65 - 99 mg/dL   BUN 12 6 - 20  mg/dL   Creatinine, Ser 1.32 (H) 0.61 - 1.24 mg/dL   Calcium 8.6 (L) 8.9 - 10.3 mg/dL   GFR calc non Af Amer 51 (L) >60 mL/min   GFR calc Af Amer 59 (L) >60 mL/min    Comment: (NOTE) The eGFR has been calculated using the CKD EPI equation. This calculation has not been validated in all clinical situations. eGFR's persistently <60 mL/min signify possible Chronic Kidney Disease.    Anion gap 6 5 - 15  Glucose, capillary     Status: Abnormal   Collection Time: 11/21/16  8:22 AM  Result Value Ref Range   Glucose-Capillary 119 (H) 65 - 99 mg/dL  Glucose, capillary     Status: Abnormal   Collection Time: 11/21/16 11:49 AM  Result Value Ref Range   Glucose-Capillary 121 (H) 65 - 99 mg/dL  Glucose, capillary     Status: Abnormal   Collection Time: 11/21/16  5:18 PM  Result Value Ref Range   Glucose-Capillary 168 (H) 65 - 99 mg/dL  Glucose, capillary     Status: Abnormal   Collection Time: 11/21/16  9:48 PM  Result Value Ref Range   Glucose-Capillary 108 (H) 65 - 99 mg/dL  Basic metabolic panel     Status: Abnormal   Collection Time: 11/22/16  7:51 AM  Result Value Ref Range   Sodium 143 135 - 145 mmol/L   Potassium 3.8 3.5 - 5.1 mmol/L   Chloride 107 101 - 111 mmol/L   CO2 27 22 - 32 mmol/L   Glucose, Bld 118 (H) 65 - 99 mg/dL    BUN 12 6 - 20 mg/dL   Creatinine, Ser 1.12 0.61 - 1.24 mg/dL   Calcium 9.1 8.9 - 10.3 mg/dL   GFR calc non Af Amer >60 >60 mL/min   GFR calc Af Amer >60 >60 mL/min    Comment: (NOTE) The eGFR has been calculated using the CKD EPI equation. This calculation has not been validated in all clinical situations. eGFR's persistently <60 mL/min signify possible Chronic Kidney Disease.    Anion gap 9 5 - 15  Glucose, capillary     Status: Abnormal   Collection Time: 11/22/16  7:51 AM  Result Value Ref Range   Glucose-Capillary 114 (H) 65 - 99 mg/dL  Glucose, capillary     Status: Abnormal   Collection Time: 11/22/16 12:13 PM  Result Value Ref Range   Glucose-Capillary 132 (H) 65 - 99 mg/dL    Current Facility-Administered Medications  Medication Dose Route Frequency Provider Last Rate Last Dose  . acetaminophen (TYLENOL) tablet 650 mg  650 mg Oral Q6H PRN Rise Patience, MD       Or  . acetaminophen (TYLENOL) suppository 650 mg  650 mg Rectal Q6H PRN Rise Patience, MD      . aspirin tablet 325 mg  325 mg Oral Daily Rise Patience, MD   325 mg at 11/22/16 1047  . atorvastatin (LIPITOR) tablet 40 mg  40 mg Oral q1800 Rise Patience, MD   40 mg at 11/21/16 1750  . bisacodyl (DULCOLAX) suppository 10 mg  10 mg Rectal Daily PRN Rama, Venetia Maxon, MD      . calcium-vitamin D (OSCAL WITH D) 500-200 MG-UNIT per tablet 1 tablet  1 tablet Oral Daily Rise Patience, MD   1 tablet at 11/22/16 1047  . enoxaparin (LOVENOX) injection 100 mg  100 mg Subcutaneous Q24H Rolla Flatten, [facility]      . furosemide (  LASIX) tablet 20 mg  20 mg Oral BID Rama, Venetia Maxon, MD   20 mg at 11/22/16 7672  . insulin aspart (novoLOG) injection 0-9 Units  0-9 Units Subcutaneous TID WC Rise Patience, MD   2 Units at 11/21/16 1751  . insulin aspart (novoLOG) injection 10 Units  10 Units Subcutaneous BID WC Rise Patience, MD   10 Units at 11/22/16 [phone]  . insulin detemir (LEVEMIR)  injection 60 Units  60 Units Subcutaneous Daily Rise Patience, MD   60 Units at 11/22/16 1053  . levothyroxine (SYNTHROID, LEVOTHROID) tablet 200 mcg  200 mcg Oral QAC breakfast Rise Patience, MD   200 mcg at 11/22/16 [phone]  . metoprolol tartrate (LOPRESSOR) tablet 100 mg  100 mg Oral BID Rise Patience, MD   100 mg at 11/22/16 1048  . multivitamin with minerals tablet 1 tablet  1 tablet Oral Daily Rise Patience, MD   1 tablet at 11/22/16 1049  . ondansetron (ZOFRAN) tablet 4 mg  4 mg Oral Q6H PRN Rise Patience, MD       Or  . ondansetron [facility]) injection 4 mg  4 mg Intravenous Q6H PRN Rise Patience, MD      . polyethylene glycol (MIRALAX / GLYCOLAX) packet 17 g  17 g Oral Daily Rama, Venetia Maxon, MD   17 g at 11/22/16 1049  . potassium chloride SA (K-DUR,KLOR-CON) CR tablet 20 mEq  20 mEq Oral BID Rama, Venetia Maxon, MD   20 mEq at 11/22/16 1049  . sulfamethoxazole-trimethoprim (BACTRIM DS,SEPTRA DS) 800-160 MG per tablet 1 tablet  1 tablet Oral Q12H Rama, Venetia Maxon, MD   1 tablet at 11/22/16 1101  . tamsulosin (FLOMAX) capsule 0.4 mg  0.4 mg Oral Daily Rise Patience, MD   0.4 mg at 11/22/16 1049    Musculoskeletal: Strength & Muscle Tone: decreased Gait & Station: unsteady Patient leans: N/A  Psychiatric Specialty Exam: Physical Exam as per history and physical   ROS generalized weakness especially lower extremities. Patient has no nausea, vomiting, abdomen pain, shortness of breath and chest pain No Fever-chills, No Headache, No changes with Vision or hearing, reports vertigo No problems swallowing food or Liquids, No Chest pain, Cough or Shortness of Breath, No Abdominal pain, No Nausea or Vommitting, Bowel movements are regular, No Blood in stool or Urine, No dysuria, No new skin rashes or bruises, No new joints pains-aches,  No new weakness, tingling, numbness in any extremity, No recent weight gain or loss, No polyuria,  polydypsia or polyphagia,  A full 10 point Review of Systems was done, except as stated above, all other Review of Systems were negative.  Blood pressure 131/67, pulse 71, temperature 97.7 F (36.5 C), temperature source Oral, resp. rate 18, height 6' 2"  (1.88 m), weight (!) 195.2 kg (430 lb 4.8 oz), SpO2 96 %.Body mass index is 55.25 kg/m.  General Appearance: Casual  Eye Contact:  Good  Speech:  Clear and Coherent  Volume:  Normal  Mood:  Euthymic  Affect:  Appropriate and Congruent  Thought Process:  Coherent and Goal Directed  Orientation:  Full (Time, Place, and Person)  Thought Content:  Logical  Suicidal Thoughts:  No  Homicidal Thoughts:  No  Memory:  Immediate;   Good Recent;   Fair Remote;   Fair  Judgement:  Fair  Insight:  Fair  Psychomotor Activity:  Decreased  Concentration:  Concentration: Fair and Attention Span: Fair  Recall:  Good  Fund of Knowledge:  Good  Language:  Good  Akathisia:  Negative  Handed:  Right  AIMS (if indicated):     Assets:  Communication Skills Desire for Improvement Financial Resources/Insurance Housing Leisure Time Physical Health Resilience Social Support Transportation  ADL's:  Impaired  Cognition:  WNL  Sleep:        Treatment Plan Summary: 76 years old male with multiple medical problems presented with the hospital with the cellulitis of his lower extremities and also required antibiotic treatment treatment during this hospitalization. Patient denies treatment of mental health. Patient has intact cognitions.   Recommendation: Case discussed with Dr. Rockne Menghini Recommended no psychotropic medications at this time Patient benefit from short-term SNF with rehabilitation services but patient prefers in-home rehabilitation treatment. Based on my evaluation patient does meet criteria for capacity to make his own medical decisions and living arrangements.    Disposition: Supportive therapy provided about ongoing  stressors.  Ambrose Finland, MD 11/22/2016 12:32 PM

## 2016-11-22 NOTE — Progress Notes (Signed)
   Patient declined Advanced Directive.

## 2016-11-22 NOTE — Progress Notes (Signed)
Pharmacy: Lovenox Dose Adjustment for VTE prophylaxis  OBJECTIVE:  Wt: 195.2 kg   Ht: 6'2"  BMI~55 SCr 1.12  CrCl~60 ml/min (normalized)  ASSESSMENT:  [name] on lovenox for VTE prophylaxis requiring a dose adjustment for BMI>30 and CrCl>30 ml/min. The patient is currently on 90 mg SQ daily - with the updated weight, this will need to be adjusted to 100 mg SQ daily.   PLAN:  1. Adjust Lovenox to 100 mg (~0.5 mg/kg) SQ every 24 hours 2. Pharmacy will monitor peripherally for s/sx of bleeding and any necessary dose adjustments   Thank you for allowing pharmacy to be a part of this patient's care.  Derek Bass, PharmD, BCPS Clinical Pharmacist 11/22/2016 11:28 AM

## 2016-11-22 NOTE — Consult Note (Signed)
[facility] CM Primary Care Navigator  11/22/2016  SAMAJ WESSELLS 1940-05-21 [id number]   Met with patient at the bedside to identify possible discharge needs. Patient reports having "lost balance", "getting suddenly weak and fell" thathad led to this admission.  Patient confirmed Dr. [name] with [place] [facility] as hisprimary care provider.   Patient shared using [facility] in [place] to obtain medications without difficulty.  Patient states managing his own medications at home using "pill box" system filled weekly.   Patientmentionedthat his granddaughter Engineer, maintenance (IT)) or ex-wife Freda Munro) had been providingtransportationto hisdoctors'appointments.   [employer] transportation benefits discussed with patient as well.  Patient states not having family members available to assist with his care needs at home as he recovers.  [facility] list of personal care services provided as a resource when needed,with the understanding that he will pay out of the pocket for it.Patient wasgrateful forthe resource listprovided to him.  Anticipated discharge plan is home with home health services since he had adamantly refused skilled nursing facility recommended by therapy.  Patientvoiced understanding to call primary care provider's officewhen he getshome,for a post discharge follow-up appointment within a week or sooner if needs arise.Patient letter (with PCP's contact number) was provided as his reminder.  Explained to patient about [facility] CM services available for healthmanagement at home but hedenies any needs or concerns for now. He states that he is managing his diabetes well at home with diet, medications, monitoring/ recording blood sugars and following up with primary care provided if needed.  Patient had verbally agreed and opted [facility] to followup with hisrecovery at home.  Referral to [facility] calls  made.  Patientvoiced understanding to seekreferral to [facility] care management servicesfrom primary care provider if deemed necessary and appropriate in the near future.   [facility] care management information provided for future needs that he may have.  For questions, please contact:  Dannielle Huh, BSN, RN- [facility] Primary Care Navigator  Telephone: [phone] [place]

## 2016-11-22 NOTE — Progress Notes (Signed)
Progress Note    Derek Bass  GHW:[id number] DOB: 1941/01/26  DOA: 11/20/2016 PCP: [name], MD    Brief Narrative:   Chief complaint: Follow-up cellulitis/UTI status post fall  Medical records reviewed and are as summarized below:  Derek Bass is an 76 y.o. male the PMH of DM 2, hypertension, hyperlipidemia, morbid obesity, chronic lower extremity lymphedema and chronic diastolic CHF was admitted 11/20/16 for evaluation after a ground level fall. Upon initial evaluation in the ED, he was noted to have swelling and erythema of the left lower extremity. No fractures on radiographic evaluation. UA consistent with UTI.  Assessment/Plan:   Principal Problem:   LLE cellulitis in the setting of lymphedema of both lower extremities Serum lactate elevated on admission and received a fluid bolus of 1 L in the ED. Lactate subsequently cleared. Afebrile. WBC mildly elevated. Tachycardic. Initially treated with Zosyn and vancomycin, narrowed to Rocephin 11/21/16. We'll switch to oral Bactrim given enterococcus in urine that is resistant to Rocephin.   Active Problems:   Enterococcal UTI Change Rocephin to Bactrim.    Acute kidney injury Bump in creatinine noted 11/21/16, improved today. Lasix dose decreased. Vancomycin d/c'd 11/21/16.    Fall Evaluated by PT 11/20/16: SNF recommended. Patient refuses SNF placement. Psychiatry consulted for competency evaluation, as the family insists he cannot take care of himself.    Venous stasis dermatitis Hemosiderin deposits present both legs. Massive swelling. Continue [employer].    Diabetes type 2, uncontrolled (HCC) Hemoglobin A1c 6.2% indicating good outpatient control. Currently being managed with Levemir 60 units daily, NovoLog 10 units twice a day with meals, and SSI insulin sensitive scale. CBG 108-168. Continue close monitoring. Continue to hold Glucophage.    HTN (hypertension)/grade I diastolic dysfunction Blood  pressure improved. Continue metoprolol and lower dose Lasix given rise in creatinine noted 11/21/16. 2 D echo done 03/18/16: EF 60-65%, grade I DD.    Hypothyroidism Continue Synthroid.    Morbid obesity with BMI of 50.0-59.9, adult (HCC) Body mass index is 55.94 kg/m.    BPH Continue Flomax.    Hyperlipidemia Continue Lipitor and omega 3 fatty acids.    Hypokalemia Repleted.    Family Communication/Anticipated D/C date and plan/Code Status   DVT prophylaxis: Lovenox ordered. Code Status: Full Code.  Family Communication: No family present today. Disposition Plan: Home 11/23/16 if competent, otherwise SNF placement.   Medical Consultants:    Psychiatry   Anti-Infectives:    Vancomycin 11/19/16--->11/21/16  Zosyn 11/19/16---> 11/21/16  Rocephin 11/21/16--->11/22/16  Bactrim 11/22/16--->  Subjective:   No shortness of breath today. Ambulated to the nurses station with PT, felt a little weak in his right leg and one point, but overall did well. Still insists that he wants to go home.  Objective:    Vitals:   11/21/16 1103 11/21/16 2157 11/22/16 0422 11/22/16 0500  BP: 138/75 128/81 131/67   Pulse: 81 70 71   Resp: 18 18 18    Temp: 98.4 F (36.9 C) (!) 97.5 F (36.4 C) 97.7 F (36.5 C)   TempSrc: Oral Oral Oral   SpO2: 94% 99% 96%   Weight:  (!) 192.8 kg (425 lb)  (!) 195.2 kg (430 lb 4.8 oz)  Height:        Intake/Output Summary (Last 24 hours) at 11/22/16 0713 Last data filed at 11/22/16 0425  Gross per 24 hour  Intake              170 ml  Output              450 ml  Net             -280 ml   Filed Weights   11/20/16 2204 11/21/16 2157 11/22/16 0500  Weight: (!) 192.8 kg (425 lb) (!) 192.8 kg (425 lb) (!) 195.2 kg (430 lb 4.8 oz)    Exam: General: No acute distress. Cardiovascular: Heart sounds show a regular rate, and rhythm. No gallops or rubs. No murmurs. No JVD. Lungs: Clear to auscultation bilaterally with good air movement. No rales,  rhonchi or wheezes. Abdomen: Soft, nontender, nondistended with normal active bowel sounds. No masses. No hepatosplenomegaly. Neurological: Alert and oriented 3. Moves all extremities 4 with equal strength. Cranial nerves II through XII grossly intact. Skin: Abrasions to back. Extremities: Unna boots in place. Psychiatric: Mood and affect are normal. Insight and judgment are impaired.  Data Reviewed:   I have personally reviewed following labs and imaging studies:  Labs: Labs show the following: Sodium 140, potassium 3.5, chloride 107, bicarbonate 27, BUN 12, creatinine 1.32, glucose 132, calcium 8.6.  Lactic acid 2.43 ---> 1.5  CK 385, troponin less than 0.03.  TSH 2.528.  Microbiology 11/20/16: Urine culture: Greater than 100,000 Enterobacter.   Procedures and diagnostic studies:  11/20/16 Dg Chest 2 View: Similar exam to prior. Borderline cardiomegaly with vascular congestion. Peribronchial cuffing may be bronchial inflammation or pulmonary edema.   11/20/16 Dg Pelvis 1-2 Views: No evidence of pelvic fracture. Moderate osteoarthritis of both hips.    11/20/16 Ct Head Wo Contrast: No acute intracranial abnormality. Stable non contrast CT appearance of the brain, negative for age aside from probable ex vacuo related ventricular prominence.   11/20/16 Ct Pelvis Wo Contrast: No acute fracture identified about the pelvis.   11/20/16: Lower extremity venous duplex: Technically limited due to body habitus. No evidence of DVT in visualized veins.  Medications:   . aspirin  325 mg Oral Daily  . atorvastatin  40 mg Oral q1800  . calcium-vitamin D  1 tablet Oral Daily  . enoxaparin (LOVENOX) injection  90 mg Subcutaneous Q24H  . furosemide  20 mg Oral BID  . insulin aspart  0-9 Units Subcutaneous TID WC  . insulin aspart  10 Units Subcutaneous BID WC  . insulin detemir  60 Units Subcutaneous Daily  . levothyroxine  200 mcg Oral QAC breakfast  . metoprolol tartrate  100 mg Oral BID    . multivitamin with minerals  1 tablet Oral Daily  . polyethylene glycol  17 g Oral Daily  . potassium chloride  20 mEq Oral BID  . tamsulosin  0.4 mg Oral Daily   Continuous Infusions: . cefTRIAXone (ROCEPHIN)  IV Stopped (11/21/16 1820)     LOS: 2 days   RAMA,CHRISTINA  [facility] Pager [phone]. If unable to reach me by pager, please call my cell phone at [phone].  *Please refer to [email or web address], password [email or web address] to get updated schedule on who will round on this patient, as hospitalists switch teams weekly. If 7PM-7AM, please contact night-coverage at [email or web address], password [email or web address] for any overnight needs.  11/22/2016, 7:13 AM

## 2016-11-23 ENCOUNTER — Encounter (HOSPITAL_COMMUNITY): Payer: Self-pay | Admitting: Internal Medicine

## 2016-11-23 DIAGNOSIS — N390 Urinary tract infection, site not specified: Secondary | ICD-10-CM

## 2016-11-23 LAB — BASIC METABOLIC PANEL WITH GFR
Anion gap: 10 (ref 5–15)
BUN: 9 mg/dL (ref 6–20)
CO2: 29 mmol/L (ref 22–32)
Calcium: 8.7 mg/dL — ABNORMAL LOW (ref 8.9–10.3)
Chloride: 104 mmol/L (ref 101–111)
Creatinine, Ser: 1.18 mg/dL (ref 0.61–1.24)
GFR calc Af Amer: >60 mL/min
GFR calc non Af Amer: 59 mL/min — ABNORMAL LOW
Glucose, Bld: 105 mg/dL — ABNORMAL HIGH (ref 65–99)
Potassium: 3.9 mmol/L (ref 3.5–5.1)
Sodium: 143 mmol/L (ref 135–145)

## 2016-11-23 LAB — GLUCOSE, CAPILLARY
Glucose-Capillary: 118 mg/dL — ABNORMAL HIGH (ref 65–99)
Glucose-Capillary: 140 mg/dL — ABNORMAL HIGH (ref 65–99)

## 2016-11-23 MED ORDER — SULFAMETHOXAZOLE-TRIMETHOPRIM 800-160 MG PO TABS: 1.0000 | ORAL_TABLET | Freq: Two times a day (BID) | ORAL | 0 refills | Status: DC

## 2016-11-23 NOTE — Discharge Summary (Signed)
Physician Discharge Summary  Derek Bass:[id number] DOB: 05-31-1940 DOA: 11/20/2016  PCP: Derek Bush, MD  Admit date: 11/20/2016 Discharge date: 11/23/2016  Admitted From: Home Discharge disposition: Home   Recommendations for Outpatient Follow-Up:   1. F/U with PCP in 1 week to recheck cellulitis. 2. Home health services: RN, PT/OT, HHA ordered.   Discharge Diagnosis:   Principal Problem:   Cellulitis of left lower extremity Active Problems:   Diabetes type 2, uncontrolled (HCC)   HTN (hypertension)   Hypothyroidism   Morbid obesity with BMI of 50.0-59.9, adult (HCC)   Lymphedema of both lower extremities   Enterobacter UTI (urinary tract infection)   Cellulitis   AKI (acute kidney injury) ([place])  Discharge Condition: Improved.  Diet recommendation: Low sodium, heart healthy.  Carbohydrate-modified.   History of Present Illness:   Derek Bass is an 76 y.o. male the PMH of DM 2, hypertension, hyperlipidemia, morbid obesity, chronic lower extremity lymphedema and chronic diastolic CHF was admitted 11/20/16 for evaluation after a ground level fall. Upon initial evaluation in the ED, he was noted to have swelling and erythema of the left lower extremity. No fractures on radiographic evaluation. UA consistent with UTI.  Hospital Course by Problem:   Principal Problem:   LLE cellulitis in the setting of lymphedema of both lower extremities Serum lactate elevated on admission and received a fluid bolus of 1 L in the ED. Lactate subsequently cleared.  Initially treated with Zosyn and vancomycin, narrowed to Rocephin 11/21/16, now on Bactrim given enterococcus in urine that was resistant to Rocephin. Will complete 4 more days of therapy as an outpatient.  Active Problems:   Enterococcal UTI Continue Bactrim.    Acute kidney injury Bump in creatinine noted 11/21/16, stable today. Vancomycin d/c'd 11/21/16.    Fall Evaluated by PT 11/20/16: SNF  recommended. Patient refuses SNF placement. Psychiatry consulted for competency evaluation, found to have capacity. Will D/C home with home PT/OT.    Venous stasis dermatitis Hemosiderin deposits present both legs. Massive swelling. Continue [employer]. Home health RN to change weekly.    Diabetes type 2, uncontrolled (HCC) Hemoglobin A1c 6.2% indicating good outpatient control. Resume outpatient regimen at discharge.    HTN (hypertension)/grade I diastolic dysfunction Blood pressure improved. Continue metoprolol and Lasix. 2 D echo done 03/18/16: EF 60-65%, grade I DD.    Hypothyroidism Continue Synthroid.    Morbid obesity with BMI of 50.0-59.9, adult (HCC) Body mass index is 55.94 kg/m.    BPH Continue Flomax.    Hyperlipidemia Continue Lipitor and omega 3 fatty acids.    Hypokalemia Repleted and potassium WNL at discharge.  Medical Consultants:    None.   Discharge Exam:   Vitals:   11/22/16 2117 11/23/16 0538  BP: 135/62 135/71  Pulse: 71 71  Resp: 19 19  Temp: 98.4 F (36.9 C) 98 F (36.7 C)  SpO2: 98% 98%   Vitals:   11/22/16 1300 11/22/16 1710 11/22/16 2117 11/23/16 0538  BP: (!) 124/55 120/75 135/62 135/71  Pulse: 74 72 71 71  Resp:  20 19 19   Temp: 98 F (36.7 C) 97.7 F (36.5 C) 98.4 F (36.9 C) 98 F (36.7 C)  TempSrc: Oral Oral Oral Oral  SpO2: 99% 98% 98% 98%  Weight:   (!) 195.3 kg (430 lb 8.9 oz)   Height:        General exam: Appears calm and comfortable. Morbidly obese. Respiratory system: Clear to auscultation. Respiratory effort normal. Cardiovascular system:  S1 & S2 heard, RRR. No JVD,  rubs, gallops or clicks. No murmurs. Gastrointestinal system: Abdomen is nondistended, soft and nontender. No organomegaly or masses felt. Normal bowel sounds heard. Central nervous system: Alert and oriented. No focal neurological deficits. Extremities: Unna boots in place. Skin: No rashes, lesions or ulcers. Psychiatry: Judgement and  insight appear fair. Mood & affect appropriate.    The results of significant diagnostics from this hospitalization (including imaging, microbiology, ancillary and laboratory) are listed below for reference.     Procedures and Diagnostic Studies:   Dg Chest 2 View  Result Date: 11/20/2016 CLINICAL DATA:  Chest pain.  Fall. EXAM: CHEST  2 VIEW COMPARISON:  Radiographs 03/17/2016 FINDINGS: Borderline cardiomegaly. Vascular congestion. Diffuse peribronchial cuffing. No confluent airspace disease. No pneumothorax or pleural effusion. No evidence of acute osseous abnormality. Soft tissue attenuation from large body habitus limits assessment. IMPRESSION: Similar exam to prior. Borderline cardiomegaly with vascular congestion. Peribronchial cuffing may be bronchial inflammation or pulmonary edema. Electronically Signed   By: Jeb Levering M.D.   On: 11/20/2016 02:08   Dg Pelvis 1-2 Views  Result Date: 11/20/2016 CLINICAL DATA:  Fall. EXAM: PELVIS - 1-2 VIEW COMPARISON:  None. FINDINGS: The cortical margins of the bony pelvis are intact. No fracture. Pubic symphysis and sacroiliac joints are congruent. Both femoral heads are well-seated in the respective acetabula. Moderate osteoarthritis of both hips. Brachytherapy seeds in the region the prostate bed. Soft tissue attenuation from large body habitus limits assessment. IMPRESSION: No evidence of pelvic fracture. Moderate osteoarthritis of both hips. Electronically Signed   By: Jeb Levering M.D.   On: 11/20/2016 02:09   Ct Head Wo Contrast  Result Date: 11/20/2016 CLINICAL DATA:  76 year old male with weakness and dizziness after a fall several days ago. EXAM: CT HEAD WITHOUT CONTRAST TECHNIQUE: Contiguous axial images were obtained from the base of the skull through the vertex without intravenous contrast. COMPARISON:  Head CT without contrast 03/17/2016. FINDINGS: Brain: Stable cerebral volume. Stable mild ventricular prominence but the temporal  horns remain diminutive. No midline shift, mass effect, evidence of mass lesion, intracranial hemorrhage or evidence of cortically based acute infarction. Gray-white matter differentiation is within normal limits for age throughout the brain. Vascular: No suspicious intracranial vascular hyperdensity. Skull: Stable.  No acute osseous abnormality identified. Sinuses/Orbits: Minimal bubbly opacity now in the inferior right frontal sinus, and trace mucosal thickening in the right maxillary sinus. Other paranasal sinuses and mastoids are stable and well pneumatized. Other: No acute orbit or scalp soft tissue findings. IMPRESSION: No acute intracranial abnormality. Stable non contrast CT appearance of the brain, negative for age aside from probable ex vacuo related ventricular prominence. Electronically Signed   By: Genevie Ann M.D.   On: 11/20/2016 05:01   Ct Pelvis Wo Contrast  Result Date: 11/20/2016 CLINICAL DATA:  76 year old male with fall earlier this week. Family found down. Hip pain suspicious for fracture. EXAM: CT PELVIS WITHOUT CONTRAST TECHNIQUE: Multidetector CT imaging of the pelvis was performed following the standard protocol without intravenous contrast. COMPARISON:  Radiographic hip series 0134 hours today. FINDINGS: Urinary Tract: Diminutive urinary bladder. Visible ureters are decompressed. Bowel: Partially visible redundant sigmoid colon is partially gas distended. Negative visualized other colonic segments. The appendix is also partially visible and negative. No dilated distal small bowel. Vascular/Lymphatic: Vascular patency is not evaluated in the absence of IV contrast. Calcified iliac artery atherosclerosis. No pelvic lymphadenopathy. Reproductive:  Sequelae of prostate brachytherapy. Other: No pelvic free fluid. Large body  habitus. No superficial soft tissue injury identified. Musculoskeletal: Femoral heads are normally located. Both proximal femurs appear intact. Pubic rami and acetabula  appear intact. Sacral ala and SI joints appear intact. No pelvic fracture identified. 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 Visible lower lumbar spine appears intact. IMPRESSION: No acute fracture identified about the pelvis. Electronically Signed   By: Genevie Ann M.D.   On: 11/20/2016 05:05     Labs:   Basic Metabolic Panel:  Recent Labs Lab 11/19/16 2314 11/20/16 0538 11/21/16 0742 11/22/16 0751 11/23/16 0437  NA 142 141 140 143 143  K 3.4* 3.5 3.5 3.8 3.9  CL 103 104 107 107 104  CO2 24 26 27 27 29   GLUCOSE 155* 155* 132* 118* 105*  BUN 12 12 12 12 9   CREATININE 0.92 0.87 1.32* 1.12 1.18  CALCIUM 9.5 9.1 8.6* 9.1 8.7*   GFR Estimated Creatinine Clearance: 97.5 mL/min (by C-G formula based on SCr of 1.18 mg/dL). Liver Function Tests:  Recent Labs Lab 11/20/16 0538  AST 29  ALT 29  ALKPHOS 66  BILITOT 1.8*  PROT 7.4  ALBUMIN 3.2*   CBC:  Recent Labs Lab 11/19/16 2314 11/20/16 0538  WBC 10.8* 11.4*  NEUTROABS  --  7.1  HGB 14.4 14.0  HCT 42.1 40.9  MCV 86.6 86.5  PLT 170 161   Cardiac Enzymes:  Recent Labs Lab 11/19/16 2314 11/20/16 0538  CKTOTAL 385  --   TROPONINI  --  <0.03   CBG:  Recent Labs Lab 11/22/16 0751 11/22/16 1213 11/22/16 1709 11/22/16 2126 11/23/16 0753  GLUCAP 114* 132* 139* 130* 118*   Microbiology Recent Results (from the past 240 hour(s))  Culture, Urine     Status: Abnormal   Collection Time: 11/20/16 11:23 AM  Result Value Ref Range Status   Specimen Description URINE, RANDOM  Final   Special Requests NONE  Final   Culture >=100,000 COLONIES/mL ENTEROBACTER CLOACAE (A)  Final   Report Status 11/22/2016 FINAL  Final   Organism ID, Bacteria ENTEROBACTER CLOACAE (A)  Final      Susceptibility   Enterobacter cloacae - MIC*    CEFAZOLIN >=64 RESISTANT Resistant     CEFTRIAXONE >=64 RESISTANT Resistant     CIPROFLOXACIN <=0.25 SENSITIVE Sensitive     GENTAMICIN <=1 SENSITIVE Sensitive     IMIPENEM <=0.25  SENSITIVE Sensitive     NITROFURANTOIN 128 RESISTANT Resistant     TRIMETH/SULFA <=20 SENSITIVE Sensitive     PIP/TAZO 32 INTERMEDIATE Intermediate     * >=100,000 COLONIES/mL ENTEROBACTER CLOACAE     Discharge Instructions:   Discharge Instructions    Call MD for:  extreme fatigue    Complete by:  As directed    Call MD for:  persistant nausea and vomiting    Complete by:  As directed    Call MD for:  temperature >100.4    Complete by:  As directed    Diet - low sodium heart healthy    Complete by:  As directed    Diet Carb Modified    Complete by:  As directed    Increase activity slowly    Complete by:  As directed      Allergies as of 11/23/2016   No Known Allergies     Medication List    TAKE these medications   acetaminophen 650 MG CR tablet Commonly known as:  TYLENOL Take 650 mg by mouth every 8 (eight) hours as needed for  pain.   ammonium lactate 12 % cream Commonly known as:  AMLACTIN APPLY TOPICALLY AS NEEDED.   aspirin 325 MG tablet Take 325 mg by mouth daily.   atorvastatin 40 MG tablet Commonly known as:  LIPITOR Take 1 tablet (40 mg total) by mouth daily at 6 PM.   calcium-vitamin D 500-200 MG-UNIT tablet Commonly known as:  OSCAL WITH D Take 1 tablet by mouth daily.   clotrimazole 1 % cream Commonly known as:  LOTRIMIN APPLY TOPICALLY TWICE A DAY TO INTERDIGITAL WEB SPACES   furosemide 20 MG tablet Commonly known as:  LASIX TAKE 1 TABLET BY MOUTH TWICE A DAY   glucose 4 GM chewable tablet Chew 1 tablet (4 g total) by mouth as needed for low blood sugar.   LEVEMIR FLEXTOUCH 100 UNIT/ML Pen Generic drug:  Insulin Detemir INJECT 60 UNITS INTO THE SKIN DAILY WITH BREAKFAST. What changed:  Another medication with the same name was removed. Continue taking this medication, and follow the directions you see here.   levothyroxine 200 MCG tablet Commonly known as:  SYNTHROID, LEVOTHROID TAKE 1 TABLET BY MOUTH EVERY MORNING AT 6AM     loperamide 2 MG tablet Commonly known as:  IMODIUM A-D Take 2 mg by mouth as needed for diarrhea or loose stools.   metFORMIN 1000 MG tablet Commonly known as:  GLUCOPHAGE TAKE 1 TABLET BY MOUTH TWICE A DAY WITH MEALS   metoprolol tartrate 100 MG tablet Commonly known as:  LOPRESSOR TAKE 1 TABLET BY MOUTH TWICE A DAY   multivitamin capsule Take 1 capsule by mouth daily.   NOVOLIN R 100 units/mL injection Generic drug:  insulin regular INJECT 10 UNITS TOTAL INTO THE SKIN 2 TIMES DAILY, (BEFORE BREAKFAST AND BEFORE SUPPER) What changed:  Another medication with the same name was removed. Continue taking this medication, and follow the directions you see here.   OMEGA 3 PO Take 2 capsules by mouth daily. Reported on 04/14/2015   sulfamethoxazole-trimethoprim 800-160 MG tablet Commonly known as:  BACTRIM DS,SEPTRA DS Take 1 tablet by mouth every 12 (twelve) hours.   tamsulosin 0.4 MG Caps capsule Commonly known as:  FLOMAX TAKE ONE CAPSULE BY MOUTH EVERY DAY   urea 40 % Crea Commonly known as:  CARMOL APPLY TO BOTTOMS OF HEELS EVERY DAY AS NEEDED FOR CRACKING HEELS            Discharge Care Instructions        Start     Ordered   11/23/16 0000  sulfamethoxazole-trimethoprim (BACTRIM DS,SEPTRA DS) 800-160 MG tablet  Every 12 hours     11/23/16 0920   11/23/16 0000  Increase activity slowly     11/23/16 0920   11/23/16 0000  Diet - low sodium heart healthy     11/23/16 0920   11/23/16 0000  Diet Carb Modified     11/23/16 0920   11/23/16 0000  Call MD for:  temperature >100.4     11/23/16 0920   11/23/16 0000  Call MD for:  persistant nausea and vomiting     11/23/16 0920   11/23/16 0000  Call MD for:  extreme fatigue     11/23/16 0920     Follow-up Information    Derek Bush, MD. Schedule an appointment as soon as possible for a visit in 1 week(s).   Specialty:  Family Medicine Why:  Hospital follow up. Contact information: [place] [phone]  Time coordinating discharge: 35 minutes.  Signed:  RAMA,CHRISTINA  Pager [phone] [facility] 11/23/2016, 9:21 AM

## 2016-11-23 NOTE — Evaluation (Signed)
Occupational Therapy Evaluation and Discharge Patient Details Name: Derek Bass MRN: [id number] DOB: 1940-10-19 Today's Date: 11/23/2016    History of Present Illness Patient is a 76 yo male admitted 11/20/16 after a fall.  Patient iwth cellulitis of BLE's, UTI, and RLE pain.    PMH:  Morbid obesity, DM, HTN, CHF, HLD, chronic lymphedema LE's   Clinical Impression   Pt very likely close to his baseline in ADL and mobility. Has all necessary assistance and equipment at home. No further OT needs.    Follow Up Recommendations  No OT follow up    Equipment Recommendations  None recommended by OT    Recommendations for Other Services       Precautions / Restrictions Precautions Precautions: Fall Precaution Comments: R knee hyperextends in stance Restrictions Weight Bearing Restrictions: No      Mobility Bed Mobility Overal bed mobility: Needs Assistance Bed Mobility: Supine to Sit     Supine to sit: Min guard     General bed mobility comments: pt in chair  Transfers Overall transfer level: Needs assistance Equipment used: Rolling walker (2 wheeled) Transfers: Sit to/from Stand Sit to Stand: Min guard         General transfer comment: Heavy dependence on UE push/support, not needing physical assist    Balance Overall balance assessment: Needs assistance           Standing balance-Leahy Scale: Poor Standing balance comment: Reliant on UE support                           ADL either performed or assessed with clinical judgement   ADL Overall ADL's : At baseline                                       General ADL Comments: pt does not wear socks, uses slip on shoes, has his own techniques for performing ADL and IADL     Vision Baseline Vision/History: Wears glasses Wears Glasses: Reading only Patient Visual Report: No change from baseline       Perception     Praxis      Pertinent Vitals/Pain Pain Assessment:  Faces Faces Pain Scale: Hurts a little bit Pain Location: R knee; anterior and posterior aspect; noting significant hyperextension in R stance Pain Descriptors / Indicators: Aching;Sore Pain Intervention(s): Monitored during session     Hand Dominance Right   Extremity/Trunk Assessment Upper Extremity Assessment Upper Extremity Assessment: Overall WFL for tasks assessed   Lower Extremity Assessment Lower Extremity Assessment: Defer to PT evaluation       Communication Communication Communication: No difficulties   Cognition Arousal/Alertness: Awake/alert Behavior During Therapy: WFL for tasks assessed/performed Overall Cognitive Status: Within Functional Limits for tasks assessed                                     General Comments       Exercises     Shoulder Instructions      Home Living Family/patient expects to be discharged to:: Private residence Living Arrangements: Alone Available Help at Discharge: Family;Available PRN/intermittently Type of Home: House Home Access: Level entry     Home Layout: Two level;Able to live on main level with bedroom/bathroom     Bathroom Shower/Tub: Walk-in shower  Bathroom Toilet: Handicapped height     Home Equipment: [place] - single point;Bedside commode;Wheelchair - [employer] - 2 wheels          Prior Functioning/Environment Level of Independence: Independent with assistive device(s);Needs assistance  Gait / Transfers Assistance Needed: Patient uses RW for very short distances.  Otherwise uses w/c. ADL's / Homemaking Assistance Needed: Family assists with meal prep, housekeeping, laundry, bathing            OT Problem List: Impaired balance (sitting and/or standing)      OT Treatment/Interventions:      OT Goals(Current goals can be found in the care plan section) Acute Rehab OT Goals Patient Stated Goal: to go home  OT Frequency:     Barriers to D/C:            Co-evaluation               AM-PAC PT "6 Clicks" Daily Activity     Outcome Measure Help from another person eating meals?: None Help from another person taking care of personal grooming?: A Little Help from another person toileting, which includes using toliet, bedpan, or urinal?: A Little Help from another person bathing (including washing, rinsing, drying)?: A Little Help from another person to put on and taking off regular upper body clothing?: None Help from another person to put on and taking off regular lower body clothing?: None 6 Click Score: 21   End of Session    Activity Tolerance: Patient tolerated treatment well Patient left: in chair;with call bell/phone within reach;with chair alarm set  OT Visit Diagnosis: Unsteadiness on feet (R26.81)                Time: 3557-3220 OT Time Calculation (min): 15 min Charges:  OT General Charges $OT Visit: 1 Visit OT Evaluation $OT Eval Moderate Complexity: 1 Mod G-Codes:     Malka So 11/23/2016, 11:20 AM  11/23/2016 Nestor Lewandowsky, OTR/L Pager: [phone]

## 2016-11-23 NOTE — Progress Notes (Signed)
Physical Therapy Treatment Patient Details Name: Derek Bass MRN: [id number] DOB: [birth year] Today's Date: 11/23/2016    History of Present Illness Patient is a 76 yo male admitted 11/20/16 after a fall.  Patient iwth cellulitis of BLE's, UTI, and RLE pain.    PMH:  Morbid obesity, DM, HTN, CHF, HLD, chronic lymphedema LE's    PT Comments    Continuing work on functional mobility and activity tolerance;  Very motivated to get home; His improvements in walking gave me the opportunity to step back to get a bigger picture of his gait, and noted significant knee hyperextension R knee in stance, this is accompanied by R knee discomfort as well; R LE weakness is causing dependence on hyperextension for stability in R stance -- he needs continued PT intervention for strengthening, and to normalize gait pattern with the important goal of preventing knee injury; Instructed Mr. Laser to share my concerns about his R knee with HHPT/OT, talk with primary care physician about his knee, and consider checking it out with an Orthopedist;   Performed 10 reps of quad setting R knee, noted more difficulty holding quad contraction R than L  Follow Up Recommendations  Home health PT     Equipment Recommendations  None recommended by PT    Recommendations for Other Services OT consult     Precautions / Restrictions Precautions Precautions: Fall Precaution Comments: R knee hyperextends in stance    Mobility  Bed Mobility Overal bed mobility: Needs Assistance Bed Mobility: Supine to Sit     Supine to sit: Min guard     General bed mobility comments: Used bedrail; tells me he uses headboard at home to pull up; minguard for safety, no physical assist needed  Transfers Overall transfer level: Needs assistance Equipment used: Rolling walker (2 wheeled) Transfers: Sit to/from Stand Sit to Stand: Min guard (without physical contact)         General transfer comment: Heavy dependence on  UE push/support, not needing physical assist  Ambulation/Gait Ambulation/Gait assistance: Min guard (without physical contact) Ambulation Distance (Feet): 140 Feet Assistive device: Rolling walker (2 wheeled) Gait Pattern/deviations: Trunk flexed   Gait velocity interpretation: Below normal speed for age/gender General Gait Details: Cues to self-monitor for activity tolerance; noting marked R knee hyperextension in R stance, accompanied by discomfort as well; He is dependent on hyperextension for stability in stance, attributable to quad weakness; this hyperextension is accompanied by discomfort as well; strongly urged Mr. Bearse to sit if and when knee pain occurs   Stairs            Wheelchair Mobility    Modified Rankin (Stroke Patients Only)       Balance             Standing balance-Leahy Scale: Poor (approaching Fair) Standing balance comment: Reliant on UE support                            Cognition Arousal/Alertness: Awake/alert Behavior During Therapy: WFL for tasks assessed/performed Overall Cognitive Status: Within Functional Limits for tasks assessed                                        Exercises      General Comments        Pertinent Vitals/Pain Pain Assessment: Faces Faces Pain Scale: Hurts a little bit  Pain Location: R knee; anterior and posterior aspect; noting significant hyperextension in R stance Pain Descriptors / Indicators: Aching;Sore Pain Intervention(s): Monitored during session    Home Living                      Prior Function            PT Goals (current goals can now be found in the care plan section) Acute Rehab PT Goals Patient Stated Goal: to go home PT Goal Formulation: With patient Time For Goal Achievement: 11/27/16 Potential to Achieve Goals: Fair Progress towards PT goals: Progressing toward goals    Frequency    Min 3X/week      PT Plan Current plan remains  appropriate    Co-evaluation              AM-PAC PT "6 Clicks" Daily Activity  Outcome Measure  Difficulty turning over in bed (including adjusting bedclothes, sheets and blankets)?: A Little Difficulty moving from lying on back to sitting on the side of the bed? : A Little Difficulty sitting down on and standing up from a chair with arms (e.g., wheelchair, bedside commode, etc,.)?: A Little Help needed moving to and from a bed to chair (including a wheelchair)?: A Little Help needed walking in hospital room?: A Little Help needed climbing 3-5 steps with a railing? : A Lot 6 Click Score: 17    End of Session   Activity Tolerance: Patient tolerated treatment well Patient left: in chair;with call bell/phone within reach;with chair alarm set Nurse Communication: Mobility status PT Visit Diagnosis: Muscle weakness (generalized) (M62.81);History of falling (Z91.81);Difficulty in walking, not elsewhere classified (R26.2);Pain Pain - Right/Left: Right Pain - part of body: Leg     Time: [phone] (minus approx 8 mins while pt on commode) PT Time Calculation (min) (ACUTE ONLY): 53 min  Charges:  $Gait Training: 8-22 mins $Therapeutic Activity: 23-37 mins                    G Codes:       Roney Marion, PT  Acute Rehabilitation Services Pager [phone] Office [phone]    [place] 11/23/2016, 9:46 AM

## 2016-11-23 NOTE — Care Management Note (Signed)
Case Management Note  Patient Details  Name: Derek Bass MRN: [id number] Date of Birth: 03-31-1940  Subjective/Objective:      CM following for progression and d/c planning. Pt lives alone with multiple supportive family members. Has used [facility] for [facility] services previously and wishes to use that agency again with exception of PT,he would like to be sure he does not have the same PT as previously.               Action/Plan: 11/23/2016 [facility] notified of pt choice, and wishes re PT. Pt ambulatory with walker, requesting a bariatric walker, however his currentl walker is < 10yr old so we will inquire about the cost of another walker. No other needs identified.  Expected Discharge Date:  11/23/16               Expected Discharge Plan:  [place]  In-House Referral:  NA  Discharge planning Services  CM Consult  Post Acute Care Choice:  Home Health Choice offered to:  Patient  DME Arranged:    DME Agency:     HH Arranged:  RN, PT, Nurse's Aide Opp Agency:  [place]  Status of Service:  Completed, signed off  If discussed at [place] of Stay Meetings, dates discussed:    Additional Comments:  Adron Bene, RN 11/23/2016, 10:26 AM

## 2016-11-23 NOTE — Discharge Instructions (Signed)

## 2016-11-23 NOTE — Progress Notes (Signed)
Patient discharged home with daughter. AVS reviewed in length with patient. Patient aware of antibiotic that needs to [facility] and to take until gone. He acknowledges understanding of instructions and physician follow up appointment next Tuesday. He is aware that [facility] up by case management and will call him tomorrow with a time to come out and see him. VSS. Blood pressure 126/62, pulse 71, temperature 98.2 F (36.8 C), temperature source Oral, resp. rate 18, height 6\' 2"  (1.88 m), weight (!) 195.3 kg (430 lb 8.9 oz), SpO2 98 %.

## 2016-11-24 DIAGNOSIS — E039 Hypothyroidism, unspecified: Secondary | ICD-10-CM | POA: Diagnosis not present

## 2016-11-24 DIAGNOSIS — I10 Essential (primary) hypertension: Secondary | ICD-10-CM | POA: Diagnosis not present

## 2016-11-24 DIAGNOSIS — N390 Urinary tract infection, site not specified: Secondary | ICD-10-CM | POA: Diagnosis not present

## 2016-11-24 DIAGNOSIS — L03115 Cellulitis of right lower limb: Secondary | ICD-10-CM | POA: Diagnosis not present

## 2016-11-24 DIAGNOSIS — E1165 Type 2 diabetes mellitus with hyperglycemia: Secondary | ICD-10-CM | POA: Diagnosis not present

## 2016-11-24 DIAGNOSIS — I872 Venous insufficiency (chronic) (peripheral): Secondary | ICD-10-CM | POA: Diagnosis not present

## 2016-11-24 DIAGNOSIS — L03116 Cellulitis of left lower limb: Secondary | ICD-10-CM | POA: Diagnosis not present

## 2016-11-24 DIAGNOSIS — B9689 Other specified bacterial agents as the cause of diseases classified elsewhere: Secondary | ICD-10-CM | POA: Diagnosis not present

## 2016-11-24 DIAGNOSIS — I890 Lymphedema, not elsewhere classified: Secondary | ICD-10-CM | POA: Diagnosis not present

## 2016-11-25 ENCOUNTER — Telehealth: Payer: Self-pay | Admitting: *Deleted

## 2016-11-25 NOTE — Telephone Encounter (Signed)
No answer

## 2016-11-25 NOTE — Telephone Encounter (Signed)
Patient returned Victoria's call.  Patient said he couldn't get to the phone in time.

## 2016-11-26 DIAGNOSIS — B9689 Other specified bacterial agents as the cause of diseases classified elsewhere: Secondary | ICD-10-CM | POA: Diagnosis not present

## 2016-11-26 DIAGNOSIS — E039 Hypothyroidism, unspecified: Secondary | ICD-10-CM | POA: Diagnosis not present

## 2016-11-26 DIAGNOSIS — L03116 Cellulitis of left lower limb: Secondary | ICD-10-CM | POA: Diagnosis not present

## 2016-11-26 DIAGNOSIS — L03115 Cellulitis of right lower limb: Secondary | ICD-10-CM | POA: Diagnosis not present

## 2016-11-26 DIAGNOSIS — E1165 Type 2 diabetes mellitus with hyperglycemia: Secondary | ICD-10-CM | POA: Diagnosis not present

## 2016-11-26 DIAGNOSIS — I890 Lymphedema, not elsewhere classified: Secondary | ICD-10-CM | POA: Diagnosis not present

## 2016-11-26 DIAGNOSIS — N390 Urinary tract infection, site not specified: Secondary | ICD-10-CM | POA: Diagnosis not present

## 2016-11-26 DIAGNOSIS — I872 Venous insufficiency (chronic) (peripheral): Secondary | ICD-10-CM | POA: Diagnosis not present

## 2016-11-26 DIAGNOSIS — I10 Essential (primary) hypertension: Secondary | ICD-10-CM | POA: Diagnosis not present

## 2016-11-26 NOTE — Telephone Encounter (Signed)
Called to complete TCM and confirm hosp f/u, VM not available.

## 2016-11-30 ENCOUNTER — Ambulatory Visit: Payer: Medicare HMO | Admitting: Family Medicine

## 2016-11-30 DIAGNOSIS — I872 Venous insufficiency (chronic) (peripheral): Secondary | ICD-10-CM | POA: Diagnosis not present

## 2016-11-30 DIAGNOSIS — L03115 Cellulitis of right lower limb: Secondary | ICD-10-CM | POA: Diagnosis not present

## 2016-11-30 DIAGNOSIS — E1165 Type 2 diabetes mellitus with hyperglycemia: Secondary | ICD-10-CM | POA: Diagnosis not present

## 2016-11-30 DIAGNOSIS — I10 Essential (primary) hypertension: Secondary | ICD-10-CM | POA: Diagnosis not present

## 2016-11-30 DIAGNOSIS — N390 Urinary tract infection, site not specified: Secondary | ICD-10-CM | POA: Diagnosis not present

## 2016-11-30 DIAGNOSIS — B9689 Other specified bacterial agents as the cause of diseases classified elsewhere: Secondary | ICD-10-CM | POA: Diagnosis not present

## 2016-11-30 DIAGNOSIS — E039 Hypothyroidism, unspecified: Secondary | ICD-10-CM | POA: Diagnosis not present

## 2016-11-30 DIAGNOSIS — I890 Lymphedema, not elsewhere classified: Secondary | ICD-10-CM | POA: Diagnosis not present

## 2016-11-30 DIAGNOSIS — L03116 Cellulitis of left lower limb: Secondary | ICD-10-CM | POA: Diagnosis not present

## 2016-12-01 DIAGNOSIS — N390 Urinary tract infection, site not specified: Secondary | ICD-10-CM | POA: Diagnosis not present

## 2016-12-01 DIAGNOSIS — E1165 Type 2 diabetes mellitus with hyperglycemia: Secondary | ICD-10-CM | POA: Diagnosis not present

## 2016-12-01 DIAGNOSIS — B9689 Other specified bacterial agents as the cause of diseases classified elsewhere: Secondary | ICD-10-CM | POA: Diagnosis not present

## 2016-12-01 DIAGNOSIS — E039 Hypothyroidism, unspecified: Secondary | ICD-10-CM | POA: Diagnosis not present

## 2016-12-01 DIAGNOSIS — I890 Lymphedema, not elsewhere classified: Secondary | ICD-10-CM | POA: Diagnosis not present

## 2016-12-01 DIAGNOSIS — L03116 Cellulitis of left lower limb: Secondary | ICD-10-CM | POA: Diagnosis not present

## 2016-12-01 DIAGNOSIS — L03115 Cellulitis of right lower limb: Secondary | ICD-10-CM | POA: Diagnosis not present

## 2016-12-01 DIAGNOSIS — I872 Venous insufficiency (chronic) (peripheral): Secondary | ICD-10-CM | POA: Diagnosis not present

## 2016-12-01 DIAGNOSIS — I10 Essential (primary) hypertension: Secondary | ICD-10-CM | POA: Diagnosis not present

## 2016-12-02 DIAGNOSIS — L03115 Cellulitis of right lower limb: Secondary | ICD-10-CM | POA: Diagnosis not present

## 2016-12-02 DIAGNOSIS — N390 Urinary tract infection, site not specified: Secondary | ICD-10-CM | POA: Diagnosis not present

## 2016-12-02 DIAGNOSIS — L03116 Cellulitis of left lower limb: Secondary | ICD-10-CM | POA: Diagnosis not present

## 2016-12-02 DIAGNOSIS — E039 Hypothyroidism, unspecified: Secondary | ICD-10-CM | POA: Diagnosis not present

## 2016-12-02 DIAGNOSIS — I10 Essential (primary) hypertension: Secondary | ICD-10-CM | POA: Diagnosis not present

## 2016-12-02 DIAGNOSIS — I890 Lymphedema, not elsewhere classified: Secondary | ICD-10-CM | POA: Diagnosis not present

## 2016-12-02 DIAGNOSIS — E1165 Type 2 diabetes mellitus with hyperglycemia: Secondary | ICD-10-CM | POA: Diagnosis not present

## 2016-12-02 DIAGNOSIS — B9689 Other specified bacterial agents as the cause of diseases classified elsewhere: Secondary | ICD-10-CM | POA: Diagnosis not present

## 2016-12-02 DIAGNOSIS — I872 Venous insufficiency (chronic) (peripheral): Secondary | ICD-10-CM | POA: Diagnosis not present

## 2016-12-03 DIAGNOSIS — B9689 Other specified bacterial agents as the cause of diseases classified elsewhere: Secondary | ICD-10-CM | POA: Diagnosis not present

## 2016-12-03 DIAGNOSIS — N390 Urinary tract infection, site not specified: Secondary | ICD-10-CM | POA: Diagnosis not present

## 2016-12-03 DIAGNOSIS — E1165 Type 2 diabetes mellitus with hyperglycemia: Secondary | ICD-10-CM | POA: Diagnosis not present

## 2016-12-03 DIAGNOSIS — L03116 Cellulitis of left lower limb: Secondary | ICD-10-CM | POA: Diagnosis not present

## 2016-12-03 DIAGNOSIS — I10 Essential (primary) hypertension: Secondary | ICD-10-CM | POA: Diagnosis not present

## 2016-12-03 DIAGNOSIS — E039 Hypothyroidism, unspecified: Secondary | ICD-10-CM | POA: Diagnosis not present

## 2016-12-03 DIAGNOSIS — L03115 Cellulitis of right lower limb: Secondary | ICD-10-CM | POA: Diagnosis not present

## 2016-12-03 DIAGNOSIS — I872 Venous insufficiency (chronic) (peripheral): Secondary | ICD-10-CM | POA: Diagnosis not present

## 2016-12-03 DIAGNOSIS — I890 Lymphedema, not elsewhere classified: Secondary | ICD-10-CM | POA: Diagnosis not present

## 2016-12-06 ENCOUNTER — Ambulatory Visit (INDEPENDENT_AMBULATORY_CARE_PROVIDER_SITE_OTHER): Payer: Medicare HMO | Admitting: Family Medicine

## 2016-12-06 ENCOUNTER — Encounter: Payer: Self-pay | Admitting: Family Medicine

## 2016-12-06 VITALS — BP 138/82 | HR 86 | Temp 97.8°F | Wt >= 6400 oz

## 2016-12-06 DIAGNOSIS — I10 Essential (primary) hypertension: Secondary | ICD-10-CM | POA: Diagnosis not present

## 2016-12-06 DIAGNOSIS — E039 Hypothyroidism, unspecified: Secondary | ICD-10-CM | POA: Diagnosis not present

## 2016-12-06 DIAGNOSIS — L03115 Cellulitis of right lower limb: Secondary | ICD-10-CM | POA: Diagnosis not present

## 2016-12-06 DIAGNOSIS — R2681 Unsteadiness on feet: Secondary | ICD-10-CM

## 2016-12-06 DIAGNOSIS — IMO0002 Reserved for concepts with insufficient information to code with codable children: Secondary | ICD-10-CM

## 2016-12-06 DIAGNOSIS — B952 Enterococcus as the cause of diseases classified elsewhere: Secondary | ICD-10-CM

## 2016-12-06 DIAGNOSIS — I872 Venous insufficiency (chronic) (peripheral): Secondary | ICD-10-CM

## 2016-12-06 DIAGNOSIS — E1165 Type 2 diabetes mellitus with hyperglycemia: Secondary | ICD-10-CM

## 2016-12-06 DIAGNOSIS — L03116 Cellulitis of left lower limb: Secondary | ICD-10-CM | POA: Diagnosis not present

## 2016-12-06 DIAGNOSIS — Z794 Long term (current) use of insulin: Secondary | ICD-10-CM

## 2016-12-06 DIAGNOSIS — N390 Urinary tract infection, site not specified: Secondary | ICD-10-CM

## 2016-12-06 DIAGNOSIS — I890 Lymphedema, not elsewhere classified: Secondary | ICD-10-CM

## 2016-12-06 DIAGNOSIS — E118 Type 2 diabetes mellitus with unspecified complications: Secondary | ICD-10-CM

## 2016-12-06 DIAGNOSIS — Z23 Encounter for immunization: Secondary | ICD-10-CM | POA: Diagnosis not present

## 2016-12-06 DIAGNOSIS — Z6841 Body Mass Index (BMI) 40.0 and over, adult: Secondary | ICD-10-CM

## 2016-12-06 DIAGNOSIS — B9689 Other specified bacterial agents as the cause of diseases classified elsewhere: Secondary | ICD-10-CM | POA: Diagnosis not present

## 2016-12-06 NOTE — Progress Notes (Signed)
BP 138/82 (BP Location: Left Arm, Patient Position: Sitting, Cuff Size: Large)   Pulse 86   Temp 97.8 F (36.6 C) (Oral)   Wt (!) 419 lb (190.1 kg)   SpO2 95%   BMI 53.80 kg/m    CC: hosp f/u visit Subjective:    Patient ID: Derek Bass, male    DOB: Dec 04, 1940, 76 y.o.   MRN: [id number]  HPI: Derek Bass is a 76 y.o. male presenting on 12/06/2016 for Hospitalization Follow-up (Wants to discuss refill for bactrim)   Last seen 03/2016. Did not return for 2 mo f/u. Here with granddaughter Derek Bass. Derek Bass (pt's ex) keeps an eye on him too. His son and daughter also watch over him. He has life alert but was not wearing with recent fall. He has provided family members with his home key.   Recent hospitalization for concern for acute LLE cellulitis superimposed on chronic lymphedema. He also was found to have UTI. He was admitted after fall at home with prolonged time down. No rhabdo or acute fractures. Cellulitis and enterococcal UTI treated initially with zoxyn/vanc --> rocephin, then narrowed to bactrim oral outpatient. He completed antibiotic therapy. Discharged with unna boot therapy as well - getting this changed once a week. He is not currently using lymphedema pump.   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   PT evaluation - rec SNF. Patient refused. Psychiatry deemed him capable to make this decision. Discharged with [facility] PT/OT - nurse doing unna boots, PT, OT also helping, nurse aid. He has shower chair.   No recent fever, chest pain, dyspnea, dysuria.  Some occipital headache. He has not evaluated his leg.   Previous hospitalization 04/5425 with metabolic encephalopathy presumed from urosepsis due to enterobacter UTI. At that time he was discharged to [facility].   Admit date: 11/20/2016 Discharge date: 11/23/2016 TCM f/u phone call not performed - pt unable to be reached  Recommendations for outpatient f/u: 1. F/U with PCP in 1 week  to recheck cellulitis. 2. Home health services: RN, PT/OT, HHA ordered.  D/C Dx: Principal Problem:   Cellulitis of left lower extremity Active Problems:   Diabetes type 2, uncontrolled (HCC)   HTN (hypertension)   Hypothyroidism   Morbid obesity with BMI of 50.0-59.9, adult (HCC)   Lymphedema of both lower extremities   Enterobacter UTI (urinary tract infection)   Cellulitis   AKI (acute kidney injury) ([place])  Discharge Condition: Improved.  Diet recommendation: Low sodium, heart healthy.  Carbohydrate-modified.   Relevant past medical, surgical, family and social history reviewed and updated as indicated. Interim medical history since our last visit reviewed. Allergies and medications reviewed and updated. Outpatient Medications Prior to Visit  Medication Sig Dispense Refill  . acetaminophen (TYLENOL) 650 MG CR tablet Take 650 mg by mouth every 8 (eight) hours as needed for pain.     Marland Kitchen ammonium lactate (AMLACTIN) 12 % cream APPLY TOPICALLY AS NEEDED. 385 g 1  . aspirin 325 MG tablet Take 325 mg by mouth daily.    Marland Kitchen atorvastatin (LIPITOR) 40 MG tablet Take 1 tablet (40 mg total) by mouth daily at 6 PM. 90 tablet 3  . calcium-vitamin D (OSCAL WITH D) 500-200 MG-UNIT per tablet Take 1 tablet by mouth daily.    . clotrimazole (LOTRIMIN) 1 % cream APPLY TOPICALLY TWICE A DAY TO INTERDIGITAL WEB SPACES 60 g 1  . furosemide (LASIX) 20 MG tablet TAKE 1 TABLET BY MOUTH TWICE A  DAY 60 tablet 6  . glucose 4 GM chewable tablet Chew 1 tablet (4 g total) by mouth as needed for low blood sugar. 50 tablet 3  . LEVEMIR FLEXTOUCH 100 UNIT/ML Pen INJECT 60 UNITS INTO THE SKIN DAILY WITH BREAKFAST. 45 pen 6  . levothyroxine (SYNTHROID, LEVOTHROID) 200 MCG tablet TAKE 1 TABLET BY MOUTH EVERY MORNING AT 6AM 30 tablet 6  . loperamide (IMODIUM A-D) 2 MG tablet Take 2 mg by mouth as needed for diarrhea or loose stools.     . metFORMIN (GLUCOPHAGE) 1000 MG tablet TAKE 1 TABLET BY MOUTH TWICE A DAY WITH  MEALS 180 tablet 3  . metoprolol (LOPRESSOR) 100 MG tablet TAKE 1 TABLET BY MOUTH TWICE A DAY 60 tablet 6  . Multiple Vitamin (MULTIVITAMIN) capsule Take 1 capsule by mouth daily.    Marland Kitchen NOVOLIN R 100 UNIT/ML injection INJECT 10 UNITS TOTAL INTO THE SKIN 2 TIMES DAILY, (BEFORE BREAKFAST AND BEFORE SUPPER) 10 mL 3  . Omega-3 Fatty Acids (OMEGA 3 PO) Take 2 capsules by mouth daily. Reported on 04/14/2015    . tamsulosin (FLOMAX) 0.4 MG CAPS capsule TAKE ONE CAPSULE BY MOUTH EVERY DAY 30 capsule 6  . urea (CARMOL) 40 % CREA APPLY TO BOTTOMS OF HEELS EVERY DAY AS NEEDED FOR CRACKING HEELS 198.4 each 2  . sulfamethoxazole-trimethoprim (BACTRIM DS,SEPTRA DS) 800-160 MG tablet Take 1 tablet by mouth every 12 (twelve) hours. 8 tablet 0   No facility-administered medications prior to visit.      Per HPI unless specifically indicated in ROS section below Review of Systems     Objective:    BP 138/82 (BP Location: Left Arm, Patient Position: Sitting, Cuff Size: Large)   Pulse 86   Temp 97.8 F (36.6 C) (Oral)   Wt (!) 419 lb (190.1 kg)   SpO2 95%   BMI 53.80 kg/m   Wt Readings from Last 3 Encounters:  12/06/16 (!) 419 lb (190.1 kg)  11/22/16 (!) 430 lb 8.9 oz (195.3 kg)  04/22/16 (!) 407 lb (184.6 kg)    Physical Exam  Constitutional: He appears well-developed and well-nourished. No distress.  HENT:  Head: Normocephalic and atraumatic.  Mouth/Throat: Oropharynx is clear and moist. No oropharyngeal exudate.  Eyes: Pupils are equal, round, and reactive to light. Conjunctivae and EOM are normal. No scleral icterus.  Cardiovascular: Normal rate, regular rhythm, normal heart sounds and intact distal pulses.   No murmur heard. Pulmonary/Chest: Effort normal and breath sounds normal. No respiratory distress. He has no wheezes. He has no rales.  Musculoskeletal: He exhibits edema (chronic bilateral LE lymphedema).  Skin: Skin is warm and dry. No rash noted. No erythema.  Nursing note and vitals  reviewed.   Lab Results  Component Value Date   HGBA1C 6.2 (H) 11/20/2016    Lab Results  Component Value Date   CREATININE 1.18 11/23/2016    Lab Results  Component Value Date   WBC 11.4 (H) 11/20/2016   HGB 14.0 11/20/2016   HCT 40.9 11/20/2016   MCV 86.5 11/20/2016   PLT 161 11/20/2016    Lab Results  Component Value Date   TSH 2.528 11/20/2016       Assessment & Plan:   Problem List Items Addressed This Visit    Cellulitis of left lower leg - Primary    Has completed antibiotic therapy. Continue unna boot therapy which is helping.       Chronic venous stasis dermatitis of both lower extremities  Responding to unna boot therapy.      Diabetes type 2, uncontrolled (HCC)    Chronic, endorses good control based on recall cbg's. Latest A1c 6.2%       Gait instability    At high risk for falls given morbid obesity and imbalance. Recent hospitalization precipitated by fall. Appreciate HH PT involvement. He now has life alert and his family is regularly checking in on him. He regularly uses walker.       HTN (hypertension)    Chronic, stable. Continue lasix 20mg  bid and metoprolol 100mg  bid.       Hypothyroidism    Chronic, stable. Continue 232mcg levothyroxine daily.       Lymphedema of both lower extremities    Severe, currently managed with unna boots. L unna boot replaced today. He has home nurse replacing as well. Discussed need to restart lymphedema pump and compression stockings once unna boot treatment completed. May be difficult to find stockings that will fit.      Morbid obesity with BMI of 50.0-59.9, adult ([place])   UTI (urinary tract infection) due to Enterococcus    Completed treatment with bactrim.        Other Visit Diagnoses    Need for influenza vaccination       Relevant Orders   Flu Vaccine QUAD 6+ mos PF IM (Fluarix Quad PF) (Completed)       Follow up plan: Return in about 3 months (around 03/07/2017) for follow up  visit.  [name], MD

## 2016-12-06 NOTE — Patient Instructions (Signed)
Flu shot today Unna boot replaced today.  Continue current medicines.  Return as needed or in 3 months for diabetes follow up visit.  Let us know sooner if any questions or concerns.

## 2016-12-07 DIAGNOSIS — L03116 Cellulitis of left lower limb: Secondary | ICD-10-CM | POA: Diagnosis not present

## 2016-12-07 DIAGNOSIS — R2681 Unsteadiness on feet: Secondary | ICD-10-CM | POA: Insufficient documentation

## 2016-12-07 DIAGNOSIS — L03115 Cellulitis of right lower limb: Secondary | ICD-10-CM | POA: Diagnosis not present

## 2016-12-07 DIAGNOSIS — I890 Lymphedema, not elsewhere classified: Secondary | ICD-10-CM | POA: Diagnosis not present

## 2016-12-07 DIAGNOSIS — B9689 Other specified bacterial agents as the cause of diseases classified elsewhere: Secondary | ICD-10-CM | POA: Diagnosis not present

## 2016-12-07 DIAGNOSIS — I872 Venous insufficiency (chronic) (peripheral): Secondary | ICD-10-CM | POA: Diagnosis not present

## 2016-12-07 DIAGNOSIS — N390 Urinary tract infection, site not specified: Secondary | ICD-10-CM | POA: Diagnosis not present

## 2016-12-07 DIAGNOSIS — E039 Hypothyroidism, unspecified: Secondary | ICD-10-CM | POA: Diagnosis not present

## 2016-12-07 DIAGNOSIS — I10 Essential (primary) hypertension: Secondary | ICD-10-CM | POA: Diagnosis not present

## 2016-12-07 DIAGNOSIS — E1165 Type 2 diabetes mellitus with hyperglycemia: Secondary | ICD-10-CM | POA: Diagnosis not present

## 2016-12-07 NOTE — Assessment & Plan Note (Signed)
Severe, currently managed with unna boots. L unna boot replaced today. He has home nurse replacing as well. Discussed need to restart lymphedema pump and compression stockings once unna boot treatment completed. May be difficult to find stockings that will fit.

## 2016-12-07 NOTE — Assessment & Plan Note (Signed)
Chronic, endorses good control based on recall cbg's. Latest A1c 6.2%

## 2016-12-07 NOTE — Assessment & Plan Note (Signed)
Chronic, stable. Continue lasix 20mg  bid and metoprolol 100mg  bid.

## 2016-12-07 NOTE — Assessment & Plan Note (Addendum)
At high risk for falls given morbid obesity and imbalance. Recent hospitalization precipitated by fall. Appreciate HH PT involvement. He now has life alert and his family is regularly checking in on him. He regularly uses walker.

## 2016-12-07 NOTE — Assessment & Plan Note (Signed)
Chronic, stable. Continue 267mcg levothyroxine daily.

## 2016-12-07 NOTE — Assessment & Plan Note (Signed)
Responding to unna boot therapy.

## 2016-12-07 NOTE — Assessment & Plan Note (Signed)
Has completed antibiotic therapy. Continue unna boot therapy which is helping.

## 2016-12-07 NOTE — Assessment & Plan Note (Signed)
Completed treatment with bactrim.

## 2016-12-08 DIAGNOSIS — E039 Hypothyroidism, unspecified: Secondary | ICD-10-CM | POA: Diagnosis not present

## 2016-12-08 DIAGNOSIS — B9689 Other specified bacterial agents as the cause of diseases classified elsewhere: Secondary | ICD-10-CM | POA: Diagnosis not present

## 2016-12-08 DIAGNOSIS — E1165 Type 2 diabetes mellitus with hyperglycemia: Secondary | ICD-10-CM | POA: Diagnosis not present

## 2016-12-08 DIAGNOSIS — L03116 Cellulitis of left lower limb: Secondary | ICD-10-CM | POA: Diagnosis not present

## 2016-12-08 DIAGNOSIS — I890 Lymphedema, not elsewhere classified: Secondary | ICD-10-CM | POA: Diagnosis not present

## 2016-12-08 DIAGNOSIS — I10 Essential (primary) hypertension: Secondary | ICD-10-CM | POA: Diagnosis not present

## 2016-12-08 DIAGNOSIS — I872 Venous insufficiency (chronic) (peripheral): Secondary | ICD-10-CM | POA: Diagnosis not present

## 2016-12-08 DIAGNOSIS — N390 Urinary tract infection, site not specified: Secondary | ICD-10-CM | POA: Diagnosis not present

## 2016-12-08 DIAGNOSIS — L03115 Cellulitis of right lower limb: Secondary | ICD-10-CM | POA: Diagnosis not present

## 2016-12-09 DIAGNOSIS — L03116 Cellulitis of left lower limb: Secondary | ICD-10-CM | POA: Diagnosis not present

## 2016-12-09 DIAGNOSIS — E1165 Type 2 diabetes mellitus with hyperglycemia: Secondary | ICD-10-CM | POA: Diagnosis not present

## 2016-12-09 DIAGNOSIS — N390 Urinary tract infection, site not specified: Secondary | ICD-10-CM | POA: Diagnosis not present

## 2016-12-09 DIAGNOSIS — E039 Hypothyroidism, unspecified: Secondary | ICD-10-CM | POA: Diagnosis not present

## 2016-12-09 DIAGNOSIS — I10 Essential (primary) hypertension: Secondary | ICD-10-CM | POA: Diagnosis not present

## 2016-12-09 DIAGNOSIS — I890 Lymphedema, not elsewhere classified: Secondary | ICD-10-CM | POA: Diagnosis not present

## 2016-12-09 DIAGNOSIS — B9689 Other specified bacterial agents as the cause of diseases classified elsewhere: Secondary | ICD-10-CM | POA: Diagnosis not present

## 2016-12-09 DIAGNOSIS — L03115 Cellulitis of right lower limb: Secondary | ICD-10-CM | POA: Diagnosis not present

## 2016-12-09 DIAGNOSIS — I872 Venous insufficiency (chronic) (peripheral): Secondary | ICD-10-CM | POA: Diagnosis not present

## 2016-12-10 DIAGNOSIS — I872 Venous insufficiency (chronic) (peripheral): Secondary | ICD-10-CM | POA: Diagnosis not present

## 2016-12-10 DIAGNOSIS — N390 Urinary tract infection, site not specified: Secondary | ICD-10-CM | POA: Diagnosis not present

## 2016-12-10 DIAGNOSIS — E1165 Type 2 diabetes mellitus with hyperglycemia: Secondary | ICD-10-CM | POA: Diagnosis not present

## 2016-12-10 DIAGNOSIS — I10 Essential (primary) hypertension: Secondary | ICD-10-CM | POA: Diagnosis not present

## 2016-12-10 DIAGNOSIS — E039 Hypothyroidism, unspecified: Secondary | ICD-10-CM | POA: Diagnosis not present

## 2016-12-10 DIAGNOSIS — L03115 Cellulitis of right lower limb: Secondary | ICD-10-CM | POA: Diagnosis not present

## 2016-12-10 DIAGNOSIS — B9689 Other specified bacterial agents as the cause of diseases classified elsewhere: Secondary | ICD-10-CM | POA: Diagnosis not present

## 2016-12-10 DIAGNOSIS — L03116 Cellulitis of left lower limb: Secondary | ICD-10-CM | POA: Diagnosis not present

## 2016-12-10 DIAGNOSIS — I890 Lymphedema, not elsewhere classified: Secondary | ICD-10-CM | POA: Diagnosis not present

## 2016-12-11 DIAGNOSIS — I890 Lymphedema, not elsewhere classified: Secondary | ICD-10-CM | POA: Diagnosis not present

## 2016-12-11 DIAGNOSIS — E1165 Type 2 diabetes mellitus with hyperglycemia: Secondary | ICD-10-CM | POA: Diagnosis not present

## 2016-12-13 DIAGNOSIS — I872 Venous insufficiency (chronic) (peripheral): Secondary | ICD-10-CM | POA: Diagnosis not present

## 2016-12-13 DIAGNOSIS — N390 Urinary tract infection, site not specified: Secondary | ICD-10-CM | POA: Diagnosis not present

## 2016-12-13 DIAGNOSIS — I10 Essential (primary) hypertension: Secondary | ICD-10-CM | POA: Diagnosis not present

## 2016-12-13 DIAGNOSIS — L03116 Cellulitis of left lower limb: Secondary | ICD-10-CM | POA: Diagnosis not present

## 2016-12-13 DIAGNOSIS — E039 Hypothyroidism, unspecified: Secondary | ICD-10-CM | POA: Diagnosis not present

## 2016-12-13 DIAGNOSIS — B9689 Other specified bacterial agents as the cause of diseases classified elsewhere: Secondary | ICD-10-CM | POA: Diagnosis not present

## 2016-12-13 DIAGNOSIS — I890 Lymphedema, not elsewhere classified: Secondary | ICD-10-CM | POA: Diagnosis not present

## 2016-12-13 DIAGNOSIS — E1165 Type 2 diabetes mellitus with hyperglycemia: Secondary | ICD-10-CM | POA: Diagnosis not present

## 2016-12-13 DIAGNOSIS — L03115 Cellulitis of right lower limb: Secondary | ICD-10-CM | POA: Diagnosis not present

## 2016-12-14 DIAGNOSIS — I10 Essential (primary) hypertension: Secondary | ICD-10-CM | POA: Diagnosis not present

## 2016-12-14 DIAGNOSIS — B9689 Other specified bacterial agents as the cause of diseases classified elsewhere: Secondary | ICD-10-CM | POA: Diagnosis not present

## 2016-12-14 DIAGNOSIS — I890 Lymphedema, not elsewhere classified: Secondary | ICD-10-CM | POA: Diagnosis not present

## 2016-12-14 DIAGNOSIS — E1165 Type 2 diabetes mellitus with hyperglycemia: Secondary | ICD-10-CM | POA: Diagnosis not present

## 2016-12-14 DIAGNOSIS — L03116 Cellulitis of left lower limb: Secondary | ICD-10-CM | POA: Diagnosis not present

## 2016-12-14 DIAGNOSIS — N390 Urinary tract infection, site not specified: Secondary | ICD-10-CM | POA: Diagnosis not present

## 2016-12-14 DIAGNOSIS — L03115 Cellulitis of right lower limb: Secondary | ICD-10-CM | POA: Diagnosis not present

## 2016-12-14 DIAGNOSIS — I872 Venous insufficiency (chronic) (peripheral): Secondary | ICD-10-CM | POA: Diagnosis not present

## 2016-12-14 DIAGNOSIS — E039 Hypothyroidism, unspecified: Secondary | ICD-10-CM | POA: Diagnosis not present

## 2016-12-15 DIAGNOSIS — I890 Lymphedema, not elsewhere classified: Secondary | ICD-10-CM | POA: Diagnosis not present

## 2016-12-15 DIAGNOSIS — B9689 Other specified bacterial agents as the cause of diseases classified elsewhere: Secondary | ICD-10-CM | POA: Diagnosis not present

## 2016-12-15 DIAGNOSIS — E1165 Type 2 diabetes mellitus with hyperglycemia: Secondary | ICD-10-CM | POA: Diagnosis not present

## 2016-12-15 DIAGNOSIS — I10 Essential (primary) hypertension: Secondary | ICD-10-CM | POA: Diagnosis not present

## 2016-12-15 DIAGNOSIS — N390 Urinary tract infection, site not specified: Secondary | ICD-10-CM | POA: Diagnosis not present

## 2016-12-15 DIAGNOSIS — E039 Hypothyroidism, unspecified: Secondary | ICD-10-CM | POA: Diagnosis not present

## 2016-12-15 DIAGNOSIS — L03115 Cellulitis of right lower limb: Secondary | ICD-10-CM | POA: Diagnosis not present

## 2016-12-15 DIAGNOSIS — I872 Venous insufficiency (chronic) (peripheral): Secondary | ICD-10-CM | POA: Diagnosis not present

## 2016-12-15 DIAGNOSIS — L03116 Cellulitis of left lower limb: Secondary | ICD-10-CM | POA: Diagnosis not present

## 2016-12-16 DIAGNOSIS — I10 Essential (primary) hypertension: Secondary | ICD-10-CM | POA: Diagnosis not present

## 2016-12-16 DIAGNOSIS — N390 Urinary tract infection, site not specified: Secondary | ICD-10-CM | POA: Diagnosis not present

## 2016-12-16 DIAGNOSIS — L03116 Cellulitis of left lower limb: Secondary | ICD-10-CM | POA: Diagnosis not present

## 2016-12-16 DIAGNOSIS — E1165 Type 2 diabetes mellitus with hyperglycemia: Secondary | ICD-10-CM | POA: Diagnosis not present

## 2016-12-16 DIAGNOSIS — I890 Lymphedema, not elsewhere classified: Secondary | ICD-10-CM | POA: Diagnosis not present

## 2016-12-16 DIAGNOSIS — B9689 Other specified bacterial agents as the cause of diseases classified elsewhere: Secondary | ICD-10-CM | POA: Diagnosis not present

## 2016-12-16 DIAGNOSIS — I872 Venous insufficiency (chronic) (peripheral): Secondary | ICD-10-CM | POA: Diagnosis not present

## 2016-12-16 DIAGNOSIS — E039 Hypothyroidism, unspecified: Secondary | ICD-10-CM | POA: Diagnosis not present

## 2016-12-16 DIAGNOSIS — L03115 Cellulitis of right lower limb: Secondary | ICD-10-CM | POA: Diagnosis not present

## 2016-12-17 DIAGNOSIS — L03116 Cellulitis of left lower limb: Secondary | ICD-10-CM | POA: Diagnosis not present

## 2016-12-17 DIAGNOSIS — B9689 Other specified bacterial agents as the cause of diseases classified elsewhere: Secondary | ICD-10-CM | POA: Diagnosis not present

## 2016-12-17 DIAGNOSIS — E1165 Type 2 diabetes mellitus with hyperglycemia: Secondary | ICD-10-CM | POA: Diagnosis not present

## 2016-12-17 DIAGNOSIS — I890 Lymphedema, not elsewhere classified: Secondary | ICD-10-CM | POA: Diagnosis not present

## 2016-12-17 DIAGNOSIS — N390 Urinary tract infection, site not specified: Secondary | ICD-10-CM | POA: Diagnosis not present

## 2016-12-17 DIAGNOSIS — E039 Hypothyroidism, unspecified: Secondary | ICD-10-CM | POA: Diagnosis not present

## 2016-12-17 DIAGNOSIS — I10 Essential (primary) hypertension: Secondary | ICD-10-CM | POA: Diagnosis not present

## 2016-12-17 DIAGNOSIS — I872 Venous insufficiency (chronic) (peripheral): Secondary | ICD-10-CM | POA: Diagnosis not present

## 2016-12-17 DIAGNOSIS — L03115 Cellulitis of right lower limb: Secondary | ICD-10-CM | POA: Diagnosis not present

## 2016-12-20 DIAGNOSIS — N390 Urinary tract infection, site not specified: Secondary | ICD-10-CM | POA: Diagnosis not present

## 2016-12-20 DIAGNOSIS — L03115 Cellulitis of right lower limb: Secondary | ICD-10-CM | POA: Diagnosis not present

## 2016-12-20 DIAGNOSIS — E1165 Type 2 diabetes mellitus with hyperglycemia: Secondary | ICD-10-CM | POA: Diagnosis not present

## 2016-12-20 DIAGNOSIS — E039 Hypothyroidism, unspecified: Secondary | ICD-10-CM | POA: Diagnosis not present

## 2016-12-20 DIAGNOSIS — I10 Essential (primary) hypertension: Secondary | ICD-10-CM | POA: Diagnosis not present

## 2016-12-20 DIAGNOSIS — I872 Venous insufficiency (chronic) (peripheral): Secondary | ICD-10-CM | POA: Diagnosis not present

## 2016-12-20 DIAGNOSIS — L03116 Cellulitis of left lower limb: Secondary | ICD-10-CM | POA: Diagnosis not present

## 2016-12-20 DIAGNOSIS — I890 Lymphedema, not elsewhere classified: Secondary | ICD-10-CM | POA: Diagnosis not present

## 2016-12-20 DIAGNOSIS — B9689 Other specified bacterial agents as the cause of diseases classified elsewhere: Secondary | ICD-10-CM | POA: Diagnosis not present

## 2016-12-22 DIAGNOSIS — E1165 Type 2 diabetes mellitus with hyperglycemia: Secondary | ICD-10-CM | POA: Diagnosis not present

## 2016-12-22 DIAGNOSIS — I890 Lymphedema, not elsewhere classified: Secondary | ICD-10-CM | POA: Diagnosis not present

## 2016-12-22 DIAGNOSIS — E039 Hypothyroidism, unspecified: Secondary | ICD-10-CM | POA: Diagnosis not present

## 2016-12-22 DIAGNOSIS — N390 Urinary tract infection, site not specified: Secondary | ICD-10-CM | POA: Diagnosis not present

## 2016-12-22 DIAGNOSIS — L03115 Cellulitis of right lower limb: Secondary | ICD-10-CM | POA: Diagnosis not present

## 2016-12-22 DIAGNOSIS — L03116 Cellulitis of left lower limb: Secondary | ICD-10-CM | POA: Diagnosis not present

## 2016-12-22 DIAGNOSIS — B9689 Other specified bacterial agents as the cause of diseases classified elsewhere: Secondary | ICD-10-CM | POA: Diagnosis not present

## 2016-12-22 DIAGNOSIS — I10 Essential (primary) hypertension: Secondary | ICD-10-CM | POA: Diagnosis not present

## 2016-12-22 DIAGNOSIS — I872 Venous insufficiency (chronic) (peripheral): Secondary | ICD-10-CM | POA: Diagnosis not present

## 2016-12-23 ENCOUNTER — Telehealth: Payer: Self-pay

## 2016-12-23 DIAGNOSIS — I10 Essential (primary) hypertension: Secondary | ICD-10-CM | POA: Diagnosis not present

## 2016-12-23 DIAGNOSIS — I872 Venous insufficiency (chronic) (peripheral): Secondary | ICD-10-CM | POA: Diagnosis not present

## 2016-12-23 DIAGNOSIS — B9689 Other specified bacterial agents as the cause of diseases classified elsewhere: Secondary | ICD-10-CM | POA: Diagnosis not present

## 2016-12-23 DIAGNOSIS — E1165 Type 2 diabetes mellitus with hyperglycemia: Secondary | ICD-10-CM | POA: Diagnosis not present

## 2016-12-23 DIAGNOSIS — N390 Urinary tract infection, site not specified: Secondary | ICD-10-CM | POA: Diagnosis not present

## 2016-12-23 DIAGNOSIS — E039 Hypothyroidism, unspecified: Secondary | ICD-10-CM | POA: Diagnosis not present

## 2016-12-23 DIAGNOSIS — L03115 Cellulitis of right lower limb: Secondary | ICD-10-CM | POA: Diagnosis not present

## 2016-12-23 DIAGNOSIS — I890 Lymphedema, not elsewhere classified: Secondary | ICD-10-CM | POA: Diagnosis not present

## 2016-12-23 DIAGNOSIS — L03116 Cellulitis of left lower limb: Secondary | ICD-10-CM | POA: Diagnosis not present

## 2016-12-23 NOTE — Telephone Encounter (Signed)
Pt left v/m that his BS is elevated at 287 due to eating jello. Pt wants to know if should take extra novolin R insulin. Pt request cb. Med list has novolin R taking 10 units bid (before breakfast and before supper). Pt also takes levemir 60 units at breakfast and metformin 1000 mg bid with meals. Dr Darnell Level out of office.Please advise.

## 2016-12-23 NOTE — Telephone Encounter (Signed)
I would not recommend that just for an isolated, not unexpected elevated level. If it goes over 300, he can take the extra dose His A1c was only 6.2%, so don't want to overtreat

## 2016-12-23 NOTE — Telephone Encounter (Signed)
Spoke to pt. He will keep a watch on it. He was asking how much would an extra be.

## 2016-12-24 DIAGNOSIS — I890 Lymphedema, not elsewhere classified: Secondary | ICD-10-CM | POA: Diagnosis not present

## 2016-12-24 DIAGNOSIS — L03116 Cellulitis of left lower limb: Secondary | ICD-10-CM | POA: Diagnosis not present

## 2016-12-24 DIAGNOSIS — L03115 Cellulitis of right lower limb: Secondary | ICD-10-CM | POA: Diagnosis not present

## 2016-12-24 DIAGNOSIS — B9689 Other specified bacterial agents as the cause of diseases classified elsewhere: Secondary | ICD-10-CM | POA: Diagnosis not present

## 2016-12-24 DIAGNOSIS — I872 Venous insufficiency (chronic) (peripheral): Secondary | ICD-10-CM | POA: Diagnosis not present

## 2016-12-24 DIAGNOSIS — N390 Urinary tract infection, site not specified: Secondary | ICD-10-CM | POA: Diagnosis not present

## 2016-12-24 DIAGNOSIS — I10 Essential (primary) hypertension: Secondary | ICD-10-CM | POA: Diagnosis not present

## 2016-12-24 DIAGNOSIS — E1165 Type 2 diabetes mellitus with hyperglycemia: Secondary | ICD-10-CM | POA: Diagnosis not present

## 2016-12-24 DIAGNOSIS — E039 Hypothyroidism, unspecified: Secondary | ICD-10-CM | POA: Diagnosis not present

## 2016-12-24 NOTE — Telephone Encounter (Signed)
Could take extra 3-5 units if sugars >300.

## 2016-12-27 DIAGNOSIS — I872 Venous insufficiency (chronic) (peripheral): Secondary | ICD-10-CM | POA: Diagnosis not present

## 2016-12-27 DIAGNOSIS — I10 Essential (primary) hypertension: Secondary | ICD-10-CM | POA: Diagnosis not present

## 2016-12-27 DIAGNOSIS — N390 Urinary tract infection, site not specified: Secondary | ICD-10-CM | POA: Diagnosis not present

## 2016-12-27 DIAGNOSIS — E1165 Type 2 diabetes mellitus with hyperglycemia: Secondary | ICD-10-CM | POA: Diagnosis not present

## 2016-12-27 DIAGNOSIS — L03115 Cellulitis of right lower limb: Secondary | ICD-10-CM | POA: Diagnosis not present

## 2016-12-27 DIAGNOSIS — E039 Hypothyroidism, unspecified: Secondary | ICD-10-CM | POA: Diagnosis not present

## 2016-12-27 DIAGNOSIS — L03116 Cellulitis of left lower limb: Secondary | ICD-10-CM | POA: Diagnosis not present

## 2016-12-27 DIAGNOSIS — I890 Lymphedema, not elsewhere classified: Secondary | ICD-10-CM | POA: Diagnosis not present

## 2016-12-27 DIAGNOSIS — B9689 Other specified bacterial agents as the cause of diseases classified elsewhere: Secondary | ICD-10-CM | POA: Diagnosis not present

## 2016-12-27 NOTE — Telephone Encounter (Signed)
Left message on vm per dpr relaying message per Dr. G.  

## 2016-12-29 ENCOUNTER — Telehealth: Payer: Self-pay

## 2016-12-29 DIAGNOSIS — I10 Essential (primary) hypertension: Secondary | ICD-10-CM | POA: Diagnosis not present

## 2016-12-29 DIAGNOSIS — I890 Lymphedema, not elsewhere classified: Secondary | ICD-10-CM | POA: Diagnosis not present

## 2016-12-29 DIAGNOSIS — E1165 Type 2 diabetes mellitus with hyperglycemia: Secondary | ICD-10-CM | POA: Diagnosis not present

## 2016-12-29 DIAGNOSIS — N390 Urinary tract infection, site not specified: Secondary | ICD-10-CM | POA: Diagnosis not present

## 2016-12-29 DIAGNOSIS — L03115 Cellulitis of right lower limb: Secondary | ICD-10-CM | POA: Diagnosis not present

## 2016-12-29 DIAGNOSIS — I872 Venous insufficiency (chronic) (peripheral): Secondary | ICD-10-CM | POA: Diagnosis not present

## 2016-12-29 DIAGNOSIS — L03116 Cellulitis of left lower limb: Secondary | ICD-10-CM | POA: Diagnosis not present

## 2016-12-29 DIAGNOSIS — B9689 Other specified bacterial agents as the cause of diseases classified elsewhere: Secondary | ICD-10-CM | POA: Diagnosis not present

## 2016-12-29 DIAGNOSIS — E039 Hypothyroidism, unspecified: Secondary | ICD-10-CM | POA: Diagnosis not present

## 2016-12-29 NOTE — Telephone Encounter (Signed)
Lenell Antu PT with [facility] left v/m requesting verbal orders for [facility] PT to be continued for next 3 weeks.Please advise.

## 2016-12-29 NOTE — Telephone Encounter (Signed)
Agree with this. Thanks.  

## 2016-12-30 DIAGNOSIS — L03115 Cellulitis of right lower limb: Secondary | ICD-10-CM | POA: Diagnosis not present

## 2016-12-30 DIAGNOSIS — B9689 Other specified bacterial agents as the cause of diseases classified elsewhere: Secondary | ICD-10-CM | POA: Diagnosis not present

## 2016-12-30 DIAGNOSIS — N390 Urinary tract infection, site not specified: Secondary | ICD-10-CM | POA: Diagnosis not present

## 2016-12-30 DIAGNOSIS — E039 Hypothyroidism, unspecified: Secondary | ICD-10-CM | POA: Diagnosis not present

## 2016-12-30 DIAGNOSIS — I872 Venous insufficiency (chronic) (peripheral): Secondary | ICD-10-CM | POA: Diagnosis not present

## 2016-12-30 DIAGNOSIS — L03116 Cellulitis of left lower limb: Secondary | ICD-10-CM | POA: Diagnosis not present

## 2016-12-30 DIAGNOSIS — E1165 Type 2 diabetes mellitus with hyperglycemia: Secondary | ICD-10-CM | POA: Diagnosis not present

## 2016-12-30 DIAGNOSIS — I10 Essential (primary) hypertension: Secondary | ICD-10-CM | POA: Diagnosis not present

## 2016-12-30 DIAGNOSIS — I890 Lymphedema, not elsewhere classified: Secondary | ICD-10-CM | POA: Diagnosis not present

## 2016-12-30 NOTE — Telephone Encounter (Signed)
Spoke with Stacy relaying message per Dr. Darnell Level. Says ok.

## 2017-01-03 DIAGNOSIS — L03116 Cellulitis of left lower limb: Secondary | ICD-10-CM | POA: Diagnosis not present

## 2017-01-03 DIAGNOSIS — E1165 Type 2 diabetes mellitus with hyperglycemia: Secondary | ICD-10-CM | POA: Diagnosis not present

## 2017-01-03 DIAGNOSIS — N390 Urinary tract infection, site not specified: Secondary | ICD-10-CM | POA: Diagnosis not present

## 2017-01-03 DIAGNOSIS — I10 Essential (primary) hypertension: Secondary | ICD-10-CM | POA: Diagnosis not present

## 2017-01-03 DIAGNOSIS — I890 Lymphedema, not elsewhere classified: Secondary | ICD-10-CM | POA: Diagnosis not present

## 2017-01-03 DIAGNOSIS — L03115 Cellulitis of right lower limb: Secondary | ICD-10-CM | POA: Diagnosis not present

## 2017-01-03 DIAGNOSIS — I872 Venous insufficiency (chronic) (peripheral): Secondary | ICD-10-CM | POA: Diagnosis not present

## 2017-01-03 DIAGNOSIS — B9689 Other specified bacterial agents as the cause of diseases classified elsewhere: Secondary | ICD-10-CM | POA: Diagnosis not present

## 2017-01-03 DIAGNOSIS — E039 Hypothyroidism, unspecified: Secondary | ICD-10-CM | POA: Diagnosis not present

## 2017-01-05 DIAGNOSIS — E039 Hypothyroidism, unspecified: Secondary | ICD-10-CM | POA: Diagnosis not present

## 2017-01-05 DIAGNOSIS — L03116 Cellulitis of left lower limb: Secondary | ICD-10-CM | POA: Diagnosis not present

## 2017-01-05 DIAGNOSIS — N390 Urinary tract infection, site not specified: Secondary | ICD-10-CM | POA: Diagnosis not present

## 2017-01-05 DIAGNOSIS — I890 Lymphedema, not elsewhere classified: Secondary | ICD-10-CM | POA: Diagnosis not present

## 2017-01-05 DIAGNOSIS — E1165 Type 2 diabetes mellitus with hyperglycemia: Secondary | ICD-10-CM | POA: Diagnosis not present

## 2017-01-05 DIAGNOSIS — I872 Venous insufficiency (chronic) (peripheral): Secondary | ICD-10-CM | POA: Diagnosis not present

## 2017-01-05 DIAGNOSIS — I10 Essential (primary) hypertension: Secondary | ICD-10-CM | POA: Diagnosis not present

## 2017-01-05 DIAGNOSIS — L03115 Cellulitis of right lower limb: Secondary | ICD-10-CM | POA: Diagnosis not present

## 2017-01-05 DIAGNOSIS — B9689 Other specified bacterial agents as the cause of diseases classified elsewhere: Secondary | ICD-10-CM | POA: Diagnosis not present

## 2017-01-07 DIAGNOSIS — I10 Essential (primary) hypertension: Secondary | ICD-10-CM | POA: Diagnosis not present

## 2017-01-07 DIAGNOSIS — I872 Venous insufficiency (chronic) (peripheral): Secondary | ICD-10-CM | POA: Diagnosis not present

## 2017-01-07 DIAGNOSIS — N390 Urinary tract infection, site not specified: Secondary | ICD-10-CM | POA: Diagnosis not present

## 2017-01-07 DIAGNOSIS — L03115 Cellulitis of right lower limb: Secondary | ICD-10-CM | POA: Diagnosis not present

## 2017-01-07 DIAGNOSIS — E039 Hypothyroidism, unspecified: Secondary | ICD-10-CM | POA: Diagnosis not present

## 2017-01-07 DIAGNOSIS — E1165 Type 2 diabetes mellitus with hyperglycemia: Secondary | ICD-10-CM | POA: Diagnosis not present

## 2017-01-07 DIAGNOSIS — I890 Lymphedema, not elsewhere classified: Secondary | ICD-10-CM | POA: Diagnosis not present

## 2017-01-07 DIAGNOSIS — L03116 Cellulitis of left lower limb: Secondary | ICD-10-CM | POA: Diagnosis not present

## 2017-01-07 DIAGNOSIS — B9689 Other specified bacterial agents as the cause of diseases classified elsewhere: Secondary | ICD-10-CM | POA: Diagnosis not present

## 2017-01-10 DIAGNOSIS — I890 Lymphedema, not elsewhere classified: Secondary | ICD-10-CM | POA: Diagnosis not present

## 2017-01-10 DIAGNOSIS — E1165 Type 2 diabetes mellitus with hyperglycemia: Secondary | ICD-10-CM | POA: Diagnosis not present

## 2017-01-11 DIAGNOSIS — N390 Urinary tract infection, site not specified: Secondary | ICD-10-CM | POA: Diagnosis not present

## 2017-01-11 DIAGNOSIS — I10 Essential (primary) hypertension: Secondary | ICD-10-CM | POA: Diagnosis not present

## 2017-01-11 DIAGNOSIS — I890 Lymphedema, not elsewhere classified: Secondary | ICD-10-CM | POA: Diagnosis not present

## 2017-01-11 DIAGNOSIS — L03115 Cellulitis of right lower limb: Secondary | ICD-10-CM | POA: Diagnosis not present

## 2017-01-11 DIAGNOSIS — I872 Venous insufficiency (chronic) (peripheral): Secondary | ICD-10-CM | POA: Diagnosis not present

## 2017-01-11 DIAGNOSIS — B9689 Other specified bacterial agents as the cause of diseases classified elsewhere: Secondary | ICD-10-CM | POA: Diagnosis not present

## 2017-01-11 DIAGNOSIS — E109 Type 1 diabetes mellitus without complications: Secondary | ICD-10-CM | POA: Diagnosis not present

## 2017-01-11 DIAGNOSIS — L03116 Cellulitis of left lower limb: Secondary | ICD-10-CM | POA: Diagnosis not present

## 2017-01-11 DIAGNOSIS — E039 Hypothyroidism, unspecified: Secondary | ICD-10-CM | POA: Diagnosis not present

## 2017-01-11 DIAGNOSIS — E1165 Type 2 diabetes mellitus with hyperglycemia: Secondary | ICD-10-CM | POA: Diagnosis not present

## 2017-01-14 DIAGNOSIS — B9689 Other specified bacterial agents as the cause of diseases classified elsewhere: Secondary | ICD-10-CM | POA: Diagnosis not present

## 2017-01-14 DIAGNOSIS — I10 Essential (primary) hypertension: Secondary | ICD-10-CM | POA: Diagnosis not present

## 2017-01-14 DIAGNOSIS — N390 Urinary tract infection, site not specified: Secondary | ICD-10-CM | POA: Diagnosis not present

## 2017-01-14 DIAGNOSIS — I890 Lymphedema, not elsewhere classified: Secondary | ICD-10-CM | POA: Diagnosis not present

## 2017-01-14 DIAGNOSIS — E1165 Type 2 diabetes mellitus with hyperglycemia: Secondary | ICD-10-CM | POA: Diagnosis not present

## 2017-01-14 DIAGNOSIS — L03115 Cellulitis of right lower limb: Secondary | ICD-10-CM | POA: Diagnosis not present

## 2017-01-14 DIAGNOSIS — L03116 Cellulitis of left lower limb: Secondary | ICD-10-CM | POA: Diagnosis not present

## 2017-01-14 DIAGNOSIS — I872 Venous insufficiency (chronic) (peripheral): Secondary | ICD-10-CM | POA: Diagnosis not present

## 2017-01-14 DIAGNOSIS — E039 Hypothyroidism, unspecified: Secondary | ICD-10-CM | POA: Diagnosis not present

## 2017-01-20 ENCOUNTER — Telehealth: Payer: Self-pay | Admitting: Family Medicine

## 2017-01-20 NOTE — Telephone Encounter (Signed)
Rx written and in Derek Bass's box.  I would like granddaughter to go over unna boot placement with [facility] RN prior to discharge from [facility].  Feel free to contact us with any questions regarding this.

## 2017-01-20 NOTE — Telephone Encounter (Signed)
Pt.'s granddaughter requests supplies for Derek Bass so she can continue with what home health taught her; wrap both lower legs.

## 2017-01-20 NOTE — Telephone Encounter (Signed)
Notified pt written rx is ready to pick up. [Placed rx at front office.]

## 2017-01-20 NOTE — Telephone Encounter (Signed)
Copied from [place] [id number] [id number]. Topic: Inquiry >> Jan 20, 2017 12:30 PM Pricilla Handler wrote: Reason for CRM: Patient's Granddaughter Angelica Pou has called wanting patient to have a [employer] with [employer]. Patient also stated that her grandfather is retaining fluid.Marland KitchenMarland Kitchen

## 2017-01-21 DIAGNOSIS — L03115 Cellulitis of right lower limb: Secondary | ICD-10-CM | POA: Diagnosis not present

## 2017-01-21 DIAGNOSIS — E039 Hypothyroidism, unspecified: Secondary | ICD-10-CM | POA: Diagnosis not present

## 2017-01-21 DIAGNOSIS — E1165 Type 2 diabetes mellitus with hyperglycemia: Secondary | ICD-10-CM | POA: Diagnosis not present

## 2017-01-21 DIAGNOSIS — I872 Venous insufficiency (chronic) (peripheral): Secondary | ICD-10-CM | POA: Diagnosis not present

## 2017-01-21 DIAGNOSIS — I890 Lymphedema, not elsewhere classified: Secondary | ICD-10-CM | POA: Diagnosis not present

## 2017-01-21 DIAGNOSIS — B9689 Other specified bacterial agents as the cause of diseases classified elsewhere: Secondary | ICD-10-CM | POA: Diagnosis not present

## 2017-01-21 DIAGNOSIS — N390 Urinary tract infection, site not specified: Secondary | ICD-10-CM | POA: Diagnosis not present

## 2017-01-21 DIAGNOSIS — I10 Essential (primary) hypertension: Secondary | ICD-10-CM | POA: Diagnosis not present

## 2017-01-21 DIAGNOSIS — L03116 Cellulitis of left lower limb: Secondary | ICD-10-CM | POA: Diagnosis not present

## 2017-01-24 ENCOUNTER — Ambulatory Visit: Payer: Self-pay | Admitting: *Deleted

## 2017-01-24 NOTE — Telephone Encounter (Signed)
There was a family member with him who was speaking on his behalf however he was in the background talking to me via a speakerphone too.    He expressed concern with being able to insert the Preparation H sitting on the toilet because,   "I'm a large guy".    After discussing options it was decided he would lay on his side on the bed and insert the Preparation H.   I instructed him to call back if that did not work for him.   He and the family member present acknowledged they would try laying on his side and inserting the med. And if that did not work they would call back. Reason for Disposition . Mild rectal itching  Answer Assessment - Initial Assessment Questions 1. SYMPTOM:  "What's the main symptom you're concerned about?" (e.g., pain, itching, swelling, rash)     Itching inside my rectum from frequent soft BMs 2. ONSET: "When did the ________  start?"     Today 3. RECTAL PAIN: "Do you have any pain around your rectum?" "How bad is the pain?"  (Scale 1-10; or mild, moderate, severe)  - MILD (1-3): doesn't interfere with normal activities   - MODERATE (4-7): interferes with normal activities or awakens from sleep, limping   - SEVERE (8-10): excruciating pain, unable to have a bowel movement      Moderate itching inside my rectum 4. RECTAL ITCHING: "Do you have any itching in this area?" "How bad is the itching?"  (Scale 1-10; or mild, moderate, severe)  - MILD - doesn't interfere with normal activities   - MODERATE-SEVERE: interferes with normal activities or awakens from sleep     Moderate to severe 5. CONSTIPATION: "Do you have constipation?" If so, "How bad is it?"     No.   Just soft BMs frequently 6. CAUSE: "What do you think is causing the anus symptoms?"     Having frequent BMs.   No constipation 7. OTHER SYMPTOMS: "Do you have any other symptoms?"  (e.g., rectal bleeding, abdominal pain, vomiting, fever)     *No Answer* 8. PREGNANCY: "Is there any chance you are pregnant?" "When  was your last menstrual period?"     *No Answer*  Protocols used: RECTAL [facility]

## 2017-01-25 ENCOUNTER — Telehealth: Payer: Self-pay | Admitting: Family Medicine

## 2017-01-25 NOTE — Telephone Encounter (Signed)
Copied from [place] [id number]. Topic: Quick Communication - See Telephone Encounter >> Jan 25, 2017  5:13 PM Corie Chiquito, [place] wrote: CRM for notification. See Telephone encounter for:  01/25/17.Patient is calling because he needs his unna wraps for his legs. Patient stated that both legs are starting to swell. Patient has been in contact with the pharmacy and was told he needs to get in contact with his doctor

## 2017-01-26 ENCOUNTER — Other Ambulatory Visit: Payer: Self-pay | Admitting: Family Medicine

## 2017-01-26 NOTE — Telephone Encounter (Signed)
Pt picked up rx today.

## 2017-01-26 NOTE — Telephone Encounter (Signed)
We left written Rx for unna boot up front for pt to pick up last week - needs to take this to local pharmacy or local durable equipment store to pick up. plz check with pt.

## 2017-02-04 ENCOUNTER — Telehealth: Payer: Self-pay | Admitting: Family Medicine

## 2017-02-04 NOTE — Telephone Encounter (Signed)
I have not seen a fax for this pt yet. Just checked rx tower again and nothing. I will watch for it.

## 2017-02-04 NOTE — Telephone Encounter (Signed)
Copied from [place] #[id number]. Topic: Quick Communication - See Telephone Encounter >> Feb 04, 2017 12:46 PM Boyd Kerbs wrote: CRM for notification. See Telephone encounter for: Test Strips.  [facility] - Danae Chen [phone] has faxed refill request on 10/26 and again on 11/2 which no response. She is faxing again (verified fax #). Please check to see if received and please respond  02/04/17.

## 2017-02-07 ENCOUNTER — Telehealth: Payer: Self-pay

## 2017-02-07 NOTE — Telephone Encounter (Signed)
Received faxed rx request today. However, form is asking for test strips, lancets, battery, lancing device, control solution and glucose monitor.  Also, needs frequency of pt's BS testing daily.  Spoke with pt asking him about BS testing frequency.  States he checks it BID, in the AM before breakfast and at about 2:30 PM.  However, he states he does not need any of the supplies being requested by [facility].  Says he has plenty of supplies for now.

## 2017-02-07 NOTE — Telephone Encounter (Signed)
Spoke with Derek Bass at [facility] relaying message per pt. She confirms is not due for supplies just yet. But pt's rx from last yr will be running in a few months so they like to already have new rx on file. So when pt requests items they can just send it out.  Placed rx in Dr. Synthia Innocent box.

## 2017-02-07 NOTE — Telephone Encounter (Signed)
Opened in error. See TE, 02/04/17.

## 2017-02-07 NOTE — Telephone Encounter (Signed)
Spoke with pt, confirms he does use [facility].

## 2017-02-07 NOTE — Telephone Encounter (Deleted)
Left message on vm asking pt to call back. [Need to know how often pt checks blood sugar daily.]

## 2017-02-07 NOTE — Telephone Encounter (Signed)
Please verify with patient that [facility] is his supplier and legitimate.

## 2017-02-08 NOTE — Telephone Encounter (Signed)
Faxed form to [facility].

## 2017-02-08 NOTE — Telephone Encounter (Signed)
Filled and in Lisa's box 

## 2017-02-10 DIAGNOSIS — E1165 Type 2 diabetes mellitus with hyperglycemia: Secondary | ICD-10-CM | POA: Diagnosis not present

## 2017-02-10 DIAGNOSIS — I890 Lymphedema, not elsewhere classified: Secondary | ICD-10-CM | POA: Diagnosis not present

## 2017-02-21 ENCOUNTER — Telehealth: Payer: Self-pay

## 2017-02-21 NOTE — Telephone Encounter (Signed)
PLEASE NOTE: All timestamps contained within this report are represented as [place] Standard Time. CONFIDENTIALTY NOTICE: This fax transmission is intended only for the addressee. It contains information that is legally privileged, confidential or otherwise protected from use or disclosure. If you are not the intended recipient, you are strictly prohibited from reviewing, disclosing, copying using or disseminating any of this information or taking any action in reliance on or regarding this information. If you have received this fax in error, please notify us immediately by telephone so that we can arrange for its return to [place]. Phone: [phone], Toll-Free: [phone], Fax: [phone] Page: 1 of 2 Call Id: [id number] [place] Patient Name: Derek Bass Gender: Male DOB: 1941-01-21 Age: 76 Y 36 M 22 D Return Phone Number: Address: City/State/Zip: [place] [address] Client [facility] - Client Client Site [place] Physician [name] - MD Contact Type Call Who Is Calling Patient / Member / Family / Caregiver Call Type Triage / Clinical Caller Name Willia Craze Relationship To Patient Provider Return Phone Number Please choose phone number Chief Complaint DEATH - has occurred or is imminent or pending Reason for Call Symptomatic / Request for [place] paramedic needs to speak to someone about pt that passed away call back # is [phone] Translation No Nurse Assessment Nurse: Thad Ranger, RN, Langley Gauss Date/Time (Eastern Time): 03-14-17 1:38:34 PM Confirm and document reason for call. If symptomatic, describe symptoms. ---Derek Bass paramedic needs to speak to someone about pt that passed away. States the police are at the home at this time as well. Family found pt deceased and  called 60. Wants to know if PCP will sign the death certificate. Does the patient have any new or worsening symptoms? ---No Please document clinical information provided and list any resource used. ---Advised will call the on call MD and cb. Guidelines Guideline Title Affirmed Question Affirmed Notes Nurse Date/Time (Eastern Time) Disp. Time Eilene Ghazi Time) Disposition Final User 03/14/2017 1:33:51 PM Send to Urgent Gaylord Shih 2017/03/14 1:41:15 PM Called On-Call Provider Thad Ranger, RN, Langley Gauss 2017-03-14 1:44:45 PM Clinical Call Yes Thad Ranger, RN, Langley Gauss Comments User: Romeo Apple, RN Date/Time Eilene Ghazi Time): 03-14-17 1:44:35 PM Returned call to Judson Roch and advised PCP will sign the death certificate. PLEASE NOTE: All timestamps contained within this report are represented as [place] Standard Time. CONFIDENTIALTY NOTICE: This fax transmission is intended only for the addressee. It contains information that is legally privileged, confidential or otherwise protected from use or disclosure. If you are not the intended recipient, you are strictly prohibited from reviewing, disclosing, copying using or disseminating any of this information or taking any action in reliance on or regarding this information. If you have received this fax in error, please notify us immediately by telephone so that we can arrange for its return to [place]. Phone: [phone], Toll-Free: [phone], Fax: [phone] Page: 2 of 2 Call Id: [id number] Paging DoctorName Phone DateTime Result/Outcome Message Type Notes Nani Ravens MD [phone] March 14, 2017 1:41:15 PM Called On Call Provider - Reached Doctor Paged Nani Ravens, - MD Mar 14, 2017 1:41:59 PM Spoke with On Call - General Message Result Pt report given. Dr Nani Ravens states the PCP will sign the death certificate.

## 2017-02-23 NOTE — Telephone Encounter (Signed)
Called again, unable to get through.

## 2017-02-23 NOTE — Telephone Encounter (Signed)
Called to express my condolences. No answer.  Death certificate signed.

## 2017-02-26 DIAGNOSIS — 419620001 Death: Secondary | SNOMED CT | POA: Diagnosis not present

## 2017-02-26 DEATH — deceased

## 2017-02-28 NOTE — Telephone Encounter (Signed)
Called unable to reach

## 2017-02-28 NOTE — Telephone Encounter (Signed)
Left message expressing my condolences.

## 2017-05-01 ENCOUNTER — Other Ambulatory Visit: Payer: Self-pay | Admitting: Family Medicine

## 2018-05-02 IMAGING — CT CT PELVIS W/O CM
2 of 3 series · 17 of 46 positions shown, 19 images · non-contrast
Comparison: Radiographic hip series 9469 hours today.

CLINICAL DATA: 76-year-old male with fall earlier this week. Family
found down. Hip pain suspicious for fracture.

EXAM:
CT PELVIS WITHOUT CONTRAST
TECHNIQUE: Multidetector CT imaging of the pelvis was performed following the
standard protocol without intravenous contrast.

[Series 5: pelvis 2.0 st · axial · 0.84mm/px · z∈[-936,-692]mm · 14 of 142 slices shown, 16 images]
[im 10/142  soft-tissue]
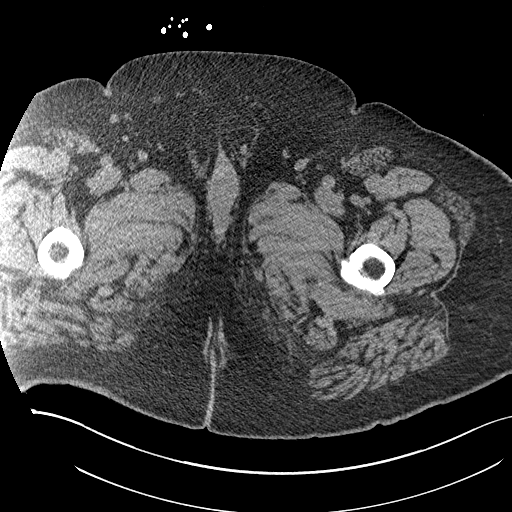
[im 10/142  bone]
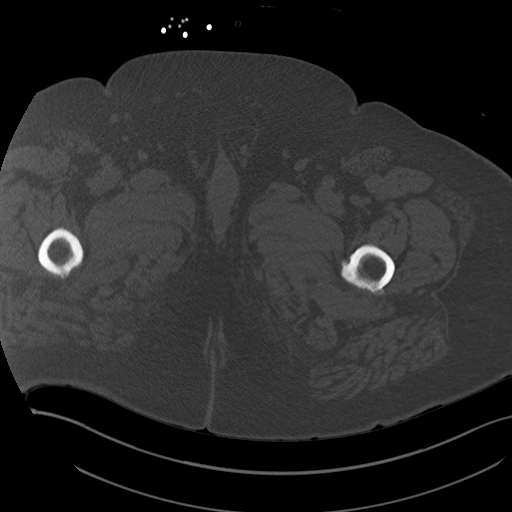
[im 19/142  soft-tissue]
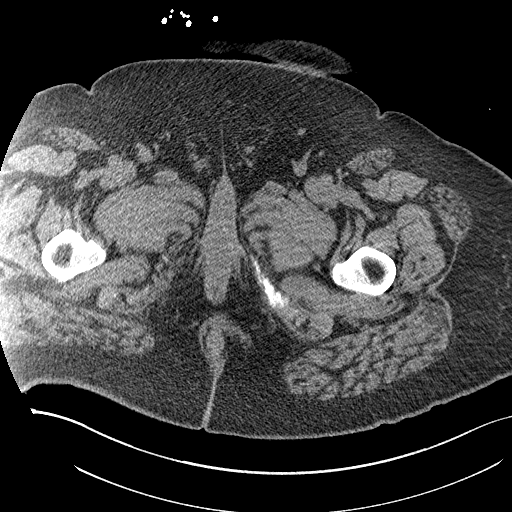
[im 28/142  soft-tissue]
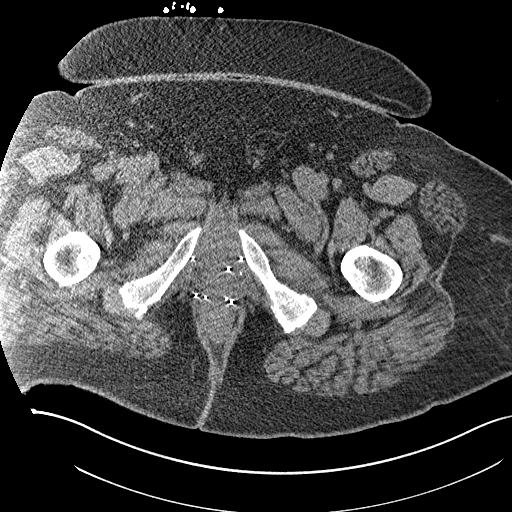
[im 37/142  soft-tissue]
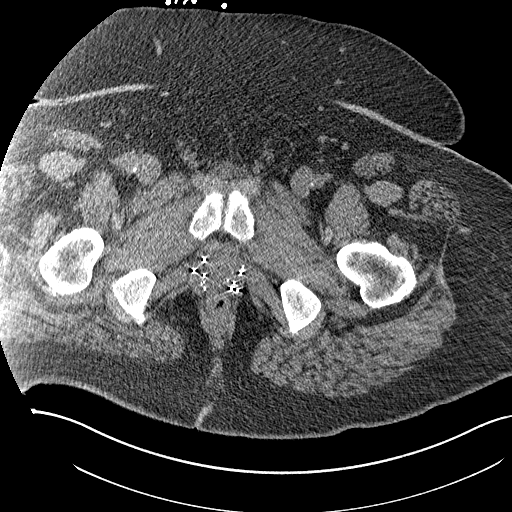
[im 46/142  soft-tissue]
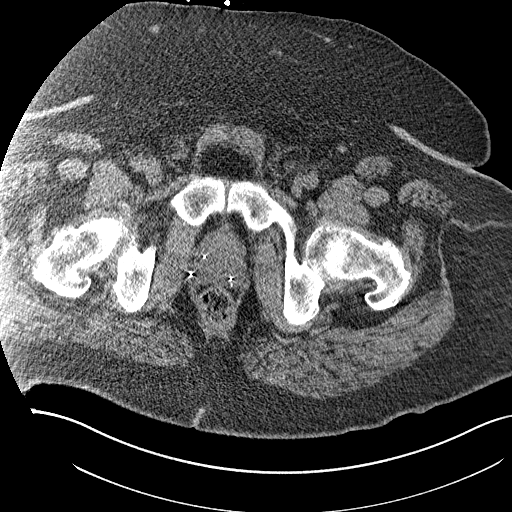
[im 55/142  soft-tissue]
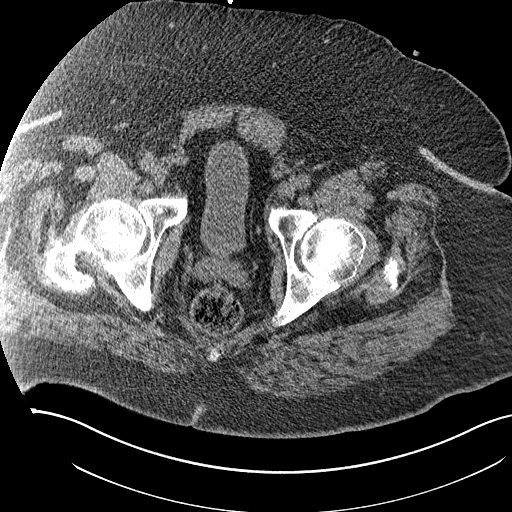
[im 64/142  soft-tissue]
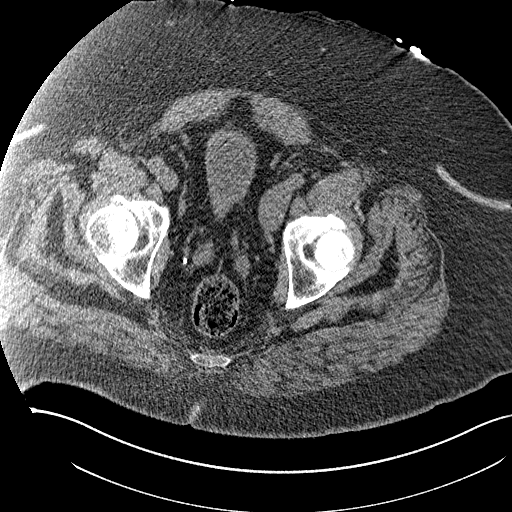
[im 78/142  soft-tissue]
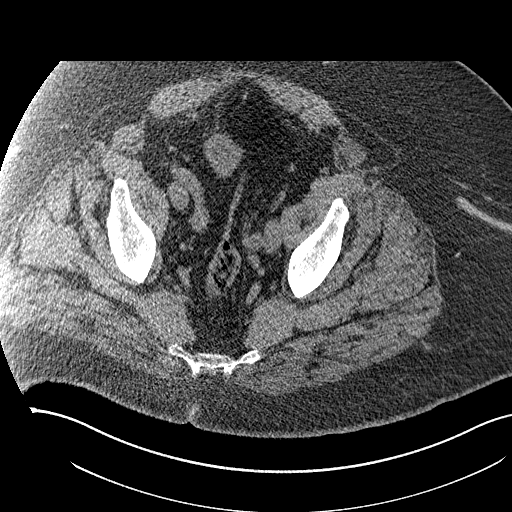
[im 87/142  soft-tissue]
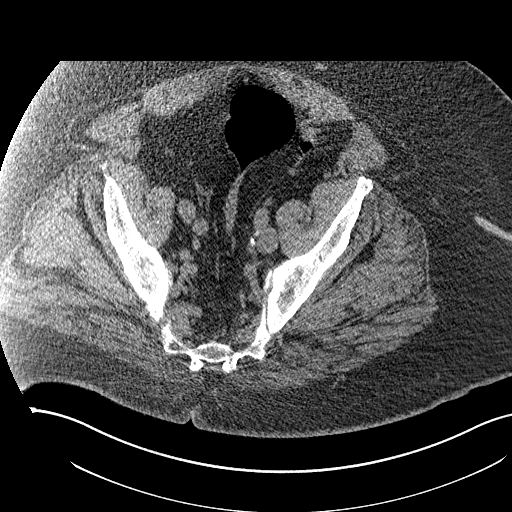
[im 87/142  bone]
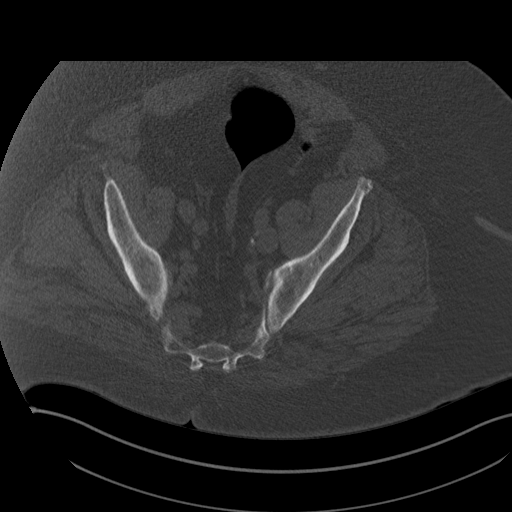
[im 96/142  soft-tissue]
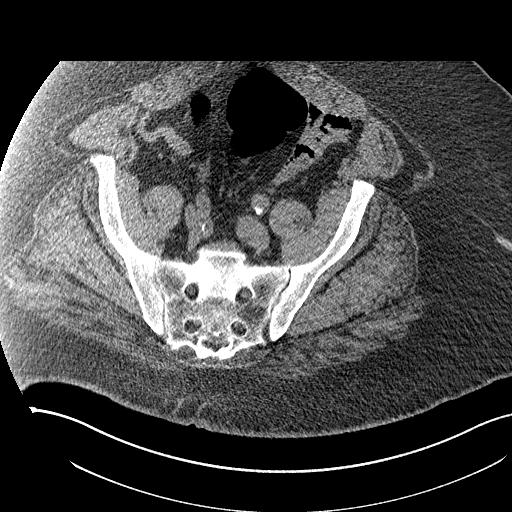
[im 105/142  soft-tissue]
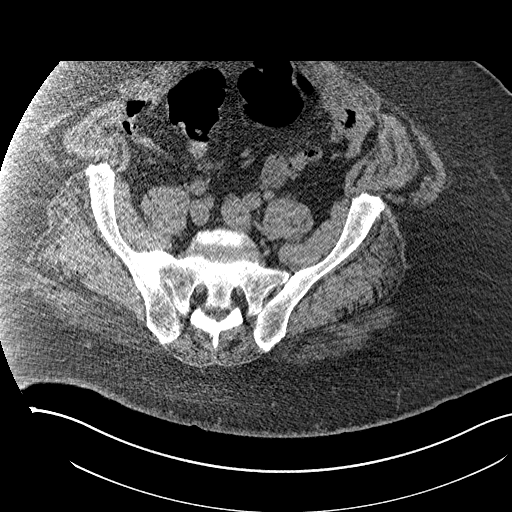
[im 114/142  soft-tissue]
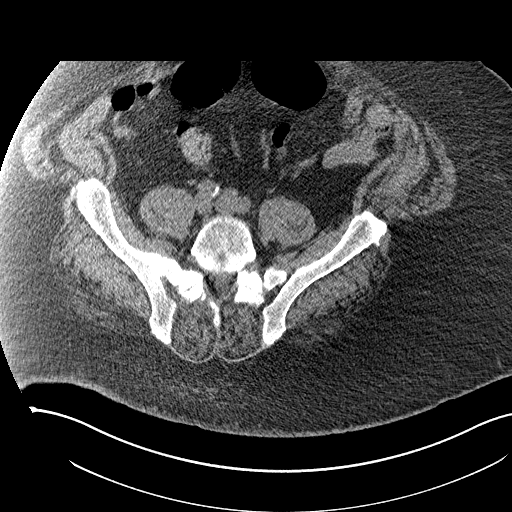
[im 123/142  soft-tissue]
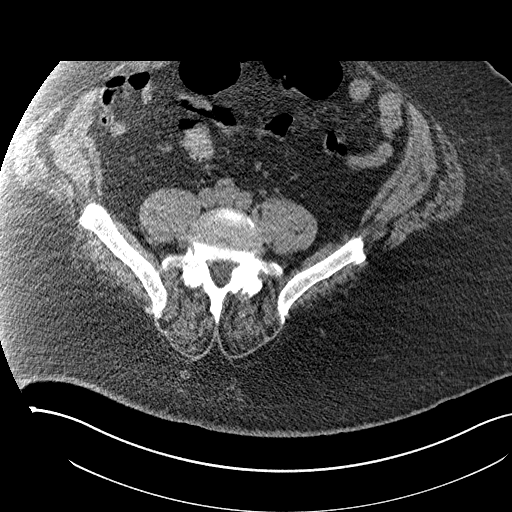
[im 132/142  soft-tissue]
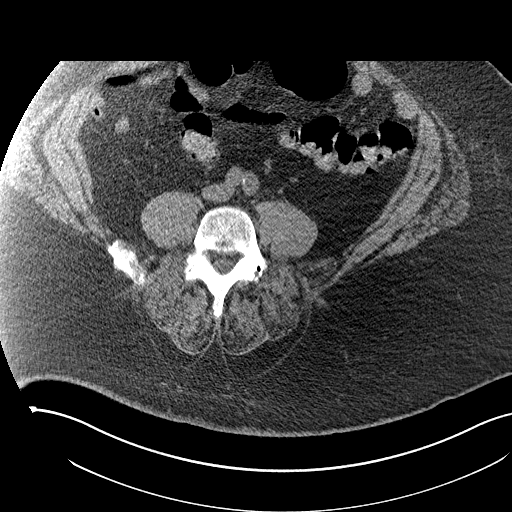

[Series 6: pelvis 2.0 cor. bone · coronal · 0.55mm/px · 3 of 120 slices shown]
[im 40/120  soft-tissue]
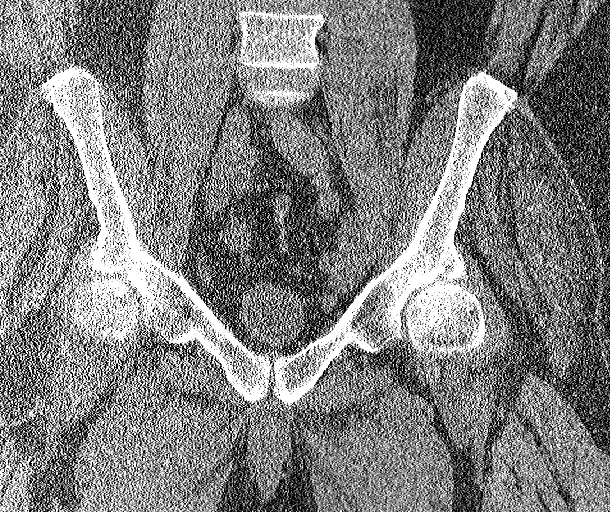
[im 53/120  soft-tissue]
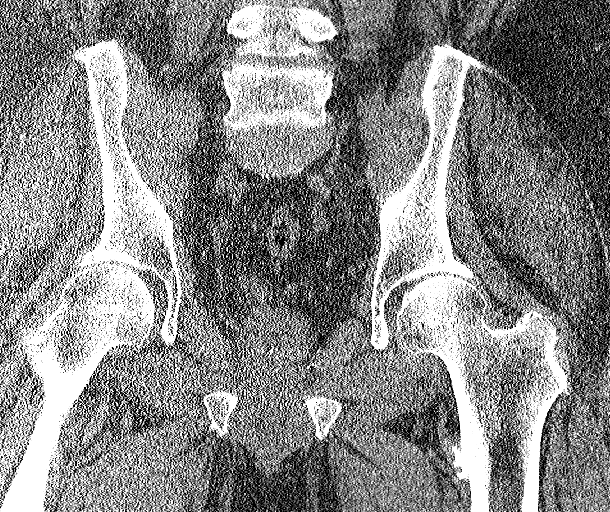
[im 67/120  soft-tissue]
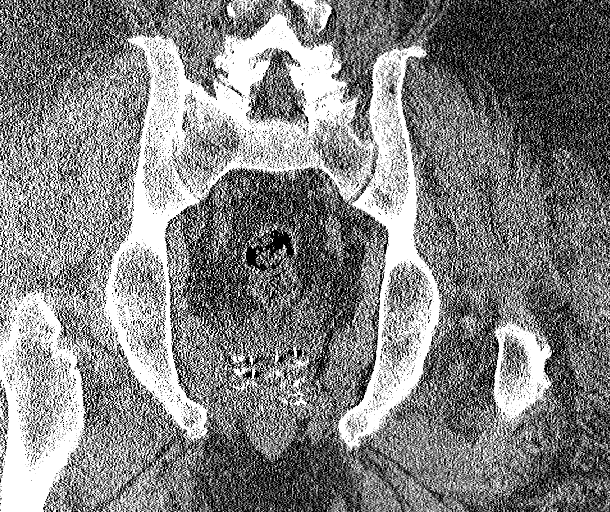

[17 of 46 positions shown; findings below may reference images not displayed]

FINDINGS: Urinary Tract: Diminutive urinary bladder. Visible ureters are
decompressed.

Bowel: Partially visible redundant sigmoid colon is partially gas
distended. Negative visualized other colonic segments. The appendix
is also partially visible and negative. No dilated distal small
bowel.

Vascular/Lymphatic: Vascular patency is not evaluated in the absence
of IV contrast. Calcified iliac artery atherosclerosis. No pelvic
lymphadenopathy.

Reproductive:  Sequelae of prostate brachytherapy.

Other: No pelvic free fluid. Large body habitus. No superficial soft
tissue injury identified.

Musculoskeletal: Femoral heads are normally located. Both proximal
femurs appear intact. Pubic rami and acetabula appear intact. Sacral
ala and SI joints appear intact. No pelvic fracture identified.
There is partial ankylosis of the right SI joint. Visible lower
lumbar spine appears intact.
IMPRESSION: No acute fracture identified about the pelvis.
